# Patient Record
Sex: Female | Born: 1965 | Race: Black or African American | Hispanic: No | Smoking: Never smoker
Health system: Southern US, Community
[De-identification: ages and names within clinical notes are randomized; demographics above are authoritative.]

## PROBLEM LIST (undated history)

## (undated) DIAGNOSIS — K219 Gastro-esophageal reflux disease without esophagitis: Secondary | ICD-10-CM

## (undated) DIAGNOSIS — J302 Other seasonal allergic rhinitis: Secondary | ICD-10-CM

## (undated) DIAGNOSIS — N920 Excessive and frequent menstruation with regular cycle: Secondary | ICD-10-CM

## (undated) DIAGNOSIS — I10 Essential (primary) hypertension: Secondary | ICD-10-CM

## (undated) HISTORY — DX: Other seasonal allergic rhinitis: J30.2

## (undated) HISTORY — DX: Gastro-esophageal reflux disease without esophagitis: K21.9

## (undated) HISTORY — DX: Excessive and frequent menstruation with regular cycle: N92.0

## (undated) HISTORY — DX: Essential (primary) hypertension: I10

## (undated) HISTORY — DX: Morbid (severe) obesity due to excess calories: E66.01

---

## 1993-12-18 HISTORY — PX: OTHER SURGICAL HISTORY: SHX169

## 2011-12-19 HISTORY — PX: ABLATION: SHX5711

## 2014-10-07 ENCOUNTER — Ambulatory Visit (INDEPENDENT_AMBULATORY_CARE_PROVIDER_SITE_OTHER): Payer: Managed Care, Other (non HMO) | Admitting: Physician Assistant

## 2014-10-07 ENCOUNTER — Encounter: Payer: Self-pay | Admitting: Physician Assistant

## 2014-10-07 VITALS — BP 126/72 | HR 86 | Temp 98.0°F | Resp 16 | Ht 65.0 in | Wt 218.0 lb

## 2014-10-07 DIAGNOSIS — K219 Gastro-esophageal reflux disease without esophagitis: Secondary | ICD-10-CM | POA: Insufficient documentation

## 2014-10-07 DIAGNOSIS — E669 Obesity, unspecified: Secondary | ICD-10-CM

## 2014-10-07 DIAGNOSIS — Z9109 Other allergy status, other than to drugs and biological substances: Secondary | ICD-10-CM

## 2014-10-07 DIAGNOSIS — J302 Other seasonal allergic rhinitis: Secondary | ICD-10-CM | POA: Insufficient documentation

## 2014-10-07 DIAGNOSIS — Z6838 Body mass index (BMI) 38.0-38.9, adult: Secondary | ICD-10-CM

## 2014-10-07 DIAGNOSIS — E6609 Other obesity due to excess calories: Secondary | ICD-10-CM | POA: Insufficient documentation

## 2014-10-07 DIAGNOSIS — M7711 Lateral epicondylitis, right elbow: Secondary | ICD-10-CM

## 2014-10-07 DIAGNOSIS — Z91048 Other nonmedicinal substance allergy status: Secondary | ICD-10-CM

## 2014-10-07 MED ORDER — MELOXICAM 15 MG PO TABS: 15.0000 mg | ORAL_TABLET | Freq: Every day | ORAL | Status: DC
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
## 2014-10-07 NOTE — Patient Instructions (Addendum)
mobic daily for 2 weeks then as needed.  Wear ace bubble brace for next 2 weeks 24 hours a day then as needed.  If not improving in next 3-4 weeks follow up.   Lateral Epicondylitis (Tennis Elbow) with Rehab Lateral epicondylitis involves inflammation and pain around the outer portion of the elbow. The pain is caused by inflammation of the tendons in the forearm that bring back (extend) the wrist. Lateral epicondylitis is also called tennis elbow, because it is very common in tennis players. However, it may occur in any individual who extends the wrist repetitively. If lateral epicondylitis is left untreated, it may become a chronic problem. SYMPTOMS   Pain, tenderness, and inflammation on the outer (lateral) side of the elbow.  Pain or weakness with gripping activities.  Pain that increases with wrist-twisting motions (playing tennis, using a screwdriver, opening a door or a jar).  Pain with lifting objects, including a coffee cup. CAUSES  Lateral epicondylitis is caused by inflammation of the tendons that extend the wrist. Causes of injury may include:  Repetitive stress and strain on the muscles and tendons that extend the wrist.  Sudden change in activity level or intensity.  Incorrect grip in racquet sports.  Incorrect grip size of racquet (often too large).  Incorrect hitting position or technique (usually backhand, leading with the elbow).  Using a racket that is too heavy. RISK INCREASES WITH:  Sports or occupations that require repetitive and/or strenuous forearm and wrist movements (tennis, squash, racquetball, carpentry).  Poor wrist and forearm strength and flexibility.  Failure to warm up properly before activity.  Resuming activity before healing, rehabilitation, and conditioning are complete. PREVENTION   Warm up and stretch properly before activity.  Maintain physical fitness:  Strength, flexibility, and endurance.  Cardiovascular fitness.  Wear and  use properly fitted equipment.  Learn and use proper technique and have a coach correct improper technique.  Wear a tennis elbow (counterforce) brace. PROGNOSIS  The course of this condition depends on the degree of the injury. If treated properly, acute cases (symptoms lasting less than 4 weeks) are often resolved in 2 to 6 weeks. Chronic (longer lasting cases) often resolve in 3 to 6 months but may require physical therapy. RELATED COMPLICATIONS   Frequently recurring symptoms, resulting in a chronic problem. Properly treating the problem the first time decreases frequency of recurrence.  Chronic inflammation, scarring tendon degeneration, and partial tendon tear, requiring surgery.  Delayed healing or resolution of symptoms. TREATMENT  Treatment first involves the use of ice and medicine to reduce pain and inflammation. Strengthening and stretching exercises may help reduce discomfort if performed regularly. These exercises may be performed at home if the condition is an acute injury. Chronic cases may require a referral to a physical therapist for evaluation and treatment. Your caregiver may advise a corticosteroid injection to help reduce inflammation. Rarely, surgery is needed. MEDICATION  If pain medicine is needed, nonsteroidal anti-inflammatory medicines (aspirin and ibuprofen), or other minor pain relievers (acetaminophen), are often advised.  Do not take pain medicine for 7 days before surgery.  Prescription pain relievers may be given, if your caregiver thinks they are needed. Use only as directed and only as much as you need.  Corticosteroid injections may be recommended. These injections should be reserved only for the most severe cases, because they can only be given a certain number of times. HEAT AND COLD  Cold treatment (icing) should be applied for 10 to 15 minutes every 2 to 3  hours for inflammation and pain, and immediately after activity that aggravates your symptoms.  Use ice packs or an ice massage.  Heat treatment may be used before performing stretching and strengthening activities prescribed by your caregiver, physical therapist, or athletic trainer. Use a heat pack or a warm water soak. SEEK MEDICAL CARE IF: Symptoms get worse or do not improve in 2 weeks, despite treatment. EXERCISES  RANGE OF MOTION (ROM) AND STRETCHING EXERCISES - Epicondylitis, Lateral (Tennis Elbow) These exercises may help you when beginning to rehabilitate your injury. Your symptoms may go away with or without further involvement from your physician, physical therapist, or athletic trainer. While completing these exercises, remember:   Restoring tissue flexibility helps normal motion to return to the joints. This allows healthier, less painful movement and activity.  An effective stretch should be held for at least 30 seconds.  A stretch should never be painful. You should only feel a gentle lengthening or release in the stretched tissue. RANGE OF MOTION - Wrist Flexion, Active-Assisted  Extend your right / left elbow with your fingers pointing down.*  Gently pull the back of your hand towards you, until you feel a gentle stretch on the top of your forearm.  Hold this position for __________ seconds. Repeat __________ times. Complete this exercise __________ times per day.  *If directed by your physician, physical therapist or athletic trainer, complete this stretch with your elbow bent, rather than extended. RANGE OF MOTION - Wrist Extension, Active-Assisted  Extend your right / left elbow and turn your palm upwards.*  Gently pull your palm and fingertips back, so your wrist extends and your fingers point more toward the ground.  You should feel a gentle stretch on the inside of your forearm.  Hold this position for __________ seconds. Repeat __________ times. Complete this exercise __________ times per day. *If directed by your physician, physical therapist or  athletic trainer, complete this stretch with your elbow bent, rather than extended. STRETCH - Wrist Flexion  Place the back of your right / left hand on a tabletop, leaving your elbow slightly bent. Your fingers should point away from your body.  Gently press the back of your hand down onto the table by straightening your elbow. You should feel a stretch on the top of your forearm.  Hold this position for __________ seconds. Repeat __________ times. Complete this stretch __________ times per day.  STRETCH - Wrist Extension   Place your right / left fingertips on a tabletop, leaving your elbow slightly bent. Your fingers should point backwards.  Gently press your fingers and palm down onto the table by straightening your elbow. You should feel a stretch on the inside of your forearm.  Hold this position for __________ seconds. Repeat __________ times. Complete this stretch __________ times per day.  STRENGTHENING EXERCISES - Epicondylitis, Lateral (Tennis Elbow) These exercises may help you when beginning to rehabilitate your injury. They may resolve your symptoms with or without further involvement from your physician, physical therapist, or athletic trainer. While completing these exercises, remember:   Muscles can gain both the endurance and the strength needed for everyday activities through controlled exercises.  Complete these exercises as instructed by your physician, physical therapist or athletic trainer. Increase the resistance and repetitions only as guided.  You may experience muscle soreness or fatigue, but the pain or discomfort you are trying to eliminate should never worsen during these exercises. If this pain does get worse, stop and make sure you are following the  directions exactly. If the pain is still present after adjustments, discontinue the exercise until you can discuss the trouble with your caregiver. STRENGTH - Wrist Flexors  Sit with your right / left forearm  palm-up and fully supported on a table or countertop. Your elbow should be resting below the height of your shoulder. Allow your wrist to extend over the edge of the surface.  Loosely holding a __________ weight, or a piece of rubber exercise band or tubing, slowly curl your hand up toward your forearm.  Hold this position for __________ seconds. Slowly lower the wrist back to the starting position in a controlled manner. Repeat __________ times. Complete this exercise __________ times per day.  STRENGTH - Wrist Extensors  Sit with your right / left forearm palm-down and fully supported on a table or countertop. Your elbow should be resting below the height of your shoulder. Allow your wrist to extend over the edge of the surface.  Loosely holding a __________ weight, or a piece of rubber exercise band or tubing, slowly curl your hand up toward your forearm.  Hold this position for __________ seconds. Slowly lower the wrist back to the starting position in a controlled manner. Repeat __________ times. Complete this exercise __________ times per day.  STRENGTH - Ulnar Deviators  Stand with a ____________________ weight in your right / left hand, or sit while holding a rubber exercise band or tubing, with your healthy arm supported on a table or countertop.  Move your wrist, so that your pinkie travels toward your forearm and your thumb moves away from your forearm.  Hold this position for __________ seconds and then slowly lower the wrist back to the starting position. Repeat __________ times. Complete this exercise __________ times per day STRENGTH - Radial Deviators  Stand with a ____________________ weight in your right / left hand, or sit while holding a rubber exercise band or tubing, with your injured arm supported on a table or countertop.  Raise your hand upward in front of you or pull up on the rubber tubing.  Hold this position for __________ seconds and then slowly lower the  wrist back to the starting position. Repeat __________ times. Complete this exercise __________ times per day. STRENGTH - Forearm Supinators   Sit with your right / left forearm supported on a table, keeping your elbow below shoulder height. Rest your hand over the edge, palm down.  Gently grip a hammer or a soup ladle.  Without moving your elbow, slowly turn your palm and hand upward to a "thumbs-up" position.  Hold this position for __________ seconds. Slowly return to the starting position. Repeat __________ times. Complete this exercise __________ times per day.  STRENGTH - Forearm Pronators   Sit with your right / left forearm supported on a table, keeping your elbow below shoulder height. Rest your hand over the edge, palm up.  Gently grip a hammer or a soup ladle.  Without moving your elbow, slowly turn your palm and hand upward to a "thumbs-up" position.  Hold this position for __________ seconds. Slowly return to the starting position. Repeat __________ times. Complete this exercise __________ times per day.  STRENGTH - Grip  Grasp a tennis ball, a dense sponge, or a large, rolled sock in your hand.  Squeeze as hard as you can, without increasing any pain.  Hold this position for __________ seconds. Release your grip slowly. Repeat __________ times. Complete this exercise __________ times per day.  STRENGTH - Elbow Extensors, Isometric  Stand or sit upright, on a firm surface. Place your right / left arm so that your palm faces your stomach, and it is at the height of your waist.  Place your opposite hand on the underside of your forearm. Gently push up as your right / left arm resists. Push as hard as you can with both arms, without causing any pain or movement at your right / left elbow. Hold this stationary position for __________ seconds. Gradually release the tension in both arms. Allow your muscles to relax completely before repeating. Document Released: 12/04/2005  Document Revised: 04/20/2014 Document Reviewed: 03/18/2009 [facility] Patient Information 2015 [employer] [place]. This information is not intended to replace advice given to you by your health care provider. Make sure you discuss any questions you have with your health care provider.

## 2014-12-22 ENCOUNTER — Other Ambulatory Visit (HOSPITAL_COMMUNITY)
Admission: RE | Admit: 2014-12-22 | Discharge: 2014-12-22 | Disposition: A | Discharge status: Home / Self Care | Payer: 59 | Source: Ambulatory Visit | Attending: Physician Assistant | Admitting: Physician Assistant

## 2014-12-22 ENCOUNTER — Encounter: Payer: Self-pay | Admitting: Physician Assistant

## 2014-12-22 ENCOUNTER — Ambulatory Visit (INDEPENDENT_AMBULATORY_CARE_PROVIDER_SITE_OTHER): Payer: 59 | Admitting: Physician Assistant

## 2014-12-22 VITALS — BP 133/77 | HR 87 | Ht 65.0 in | Wt 220.0 lb

## 2014-12-22 DIAGNOSIS — E049 Nontoxic goiter, unspecified: Secondary | ICD-10-CM

## 2014-12-22 DIAGNOSIS — E669 Obesity, unspecified: Secondary | ICD-10-CM

## 2014-12-22 DIAGNOSIS — Z131 Encounter for screening for diabetes mellitus: Secondary | ICD-10-CM

## 2014-12-22 DIAGNOSIS — R635 Abnormal weight gain: Secondary | ICD-10-CM

## 2014-12-22 DIAGNOSIS — Z0000 Encounter for general adult medical examination without abnormal findings: Secondary | ICD-10-CM

## 2014-12-22 DIAGNOSIS — Z01419 Encounter for gynecological examination (general) (routine) without abnormal findings: Secondary | ICD-10-CM | POA: Diagnosis present

## 2014-12-22 DIAGNOSIS — Z1151 Encounter for screening for human papillomavirus (HPV): Secondary | ICD-10-CM | POA: Diagnosis present

## 2014-12-22 DIAGNOSIS — M7711 Lateral epicondylitis, right elbow: Secondary | ICD-10-CM

## 2014-12-22 DIAGNOSIS — Z1239 Encounter for other screening for malignant neoplasm of breast: Secondary | ICD-10-CM

## 2014-12-22 DIAGNOSIS — Z13220 Encounter for screening for lipoid disorders: Secondary | ICD-10-CM

## 2014-12-22 HISTORY — DX: Morbid (severe) obesity due to excess calories: E66.01

## 2014-12-22 MED ORDER — MONTELUKAST SODIUM 10 MG PO TABS: 10.0000 mg | ORAL_TABLET | Freq: Every day | ORAL | Status: DC

## 2014-12-22 MED ORDER — MELOXICAM 15 MG PO TABS: 15.0000 mg | ORAL_TABLET | Freq: Every day | ORAL | Status: DC

## 2014-12-22 MED ORDER — PHENTERMINE HCL 37.5 MG PO TABS: 37.5000 mg | ORAL_TABLET | Freq: Every day | ORAL | Status: DC

## 2014-12-22 NOTE — Patient Instructions (Signed)

## 2014-12-22 NOTE — Progress Notes (Signed)
Subjective:     Joanne Winters is a 49 y.o. female and is here for a comprehensive physical exam. The patient reports problems - continues to have problems with right tennis elbow. it did get better with mobic, exercises and brace. she is not able to wear brace at work and has ran out of mobic. pain and swelling has returned. she also is very frustrated with her weight. she was on phentermine previously and would like to go back on it. [name] Kitchen.  History   Social History  . Marital Status: Divorced    Spouse Name: N/A    Number of Children: N/A  . Years of Education: N/A   Occupational History  . Not on file.   Social History Main Topics  . Smoking status: Never Smoker   . Smokeless tobacco: Not on file  . Alcohol Use: No  . Drug Use: No  . Sexual Activity: Not on file   Other Topics Concern  . Not on file   Social History Narrative   Health Maintenance  Topic Date Due  . MAMMOGRAM  08/21/1983  . INFLUENZA VACCINE  10/08/2015 (Originally 07/18/2014)  . PAP SMEAR  02/15/2017  . TETANUS/TDAP  12/18/2017    The following portions of the patient's history were reviewed and updated as appropriate: allergies, current medications, past family history, past medical history, past social history, past surgical history and problem list.  Review of Systems A comprehensive review of systems was negative.   Objective:    BP 133/77 mmHg  Pulse 87  Ht 5\' 5"  (1.651 m)  Wt 220 lb (99.791 kg)  BMI 36.61 kg/m2 General appearance: alert, cooperative, appears stated age and mildly obese Head: Normocephalic, without obvious abnormality, atraumatic Eyes: conjunctivae/corneas clear. PERRL, EOM's intact. Fundi benign. Ears: normal TM's and external ear canals both ears Nose: Nares normal. Septum midline. Mucosa normal. No drainage or sinus tenderness. Throat: lips, mucosa, and tongue normal; teeth and gums normal Neck: no adenopathy, no carotid bruit, no JVD, supple, symmetrical, trachea midline and  thyroid enlarged bilaterally more right than left.  Back: symmetric, no curvature. ROM normal. No CVA tenderness. Lungs: clear to auscultation bilaterally Breasts: normal appearance, no masses or tenderness Heart: regular rate and rhythm, S1, S2 normal, no murmur, click, rub or gallop Abdomen: soft, non-tender; bowel sounds normal; no masses,  no organomegaly Pelvic: cervix normal in appearance, external genitalia normal, no adnexal masses or tenderness, no cervical motion tenderness, uterus normal size, shape, and consistency and vagina normal without discharge Extremities: extremities normal, atraumatic, no cyanosis or edema tenderness to palpation over lateral epicodyle. Hand grip 5/5, right side.  Pulses: 2+ and symmetric Skin: Skin color, texture, turgor normal. No rashes or lesions Lymph nodes: Cervical, supraclavicular, and axillary nodes normal. Neurologic: Grossly normal    Assessment:    Healthy female exam.      Plan:    CPE- mammogram ordered. Vaccines up to date. Pt declined flu shot. Fasting labs ordered. Pap done today. Recommended calcium 1200mg  and vitamin D 800 units.   Obese- discussed weight loss plan with diet and exercise. Start phentermine. Side effects discussed. Pt has tolerated in past. Follow up in 1 month.   Thyroid goiter- discussed need for follow up yearly with Dr. Yvone NeuPatton.   Right lateral epicondylitis- mobic refilled. Discussed to wear tendonitis strap as much as can. Ice area 20min 3 times a day for next couple of days. Scheduled appt with Dr. Karie Schwalbe for injection. Continue with home exercises.  See After Visit Summary for Counseling Recommendations

## 2014-12-23 LAB — COMPLETE METABOLIC PANEL WITH GFR
ALBUMIN: 4.4 g/dL (ref 3.5–5.2)
ALT: 11 U/L (ref 0–35)
AST: 14 U/L (ref 0–37)
Alkaline Phosphatase: 51 U/L (ref 39–117)
BILIRUBIN TOTAL: 0.5 mg/dL (ref 0.2–1.2)
BUN: 8 mg/dL (ref 6–23)
CO2: 28 mEq/L (ref 19–32)
CREATININE: 0.59 mg/dL (ref 0.50–1.10)
Calcium: 9.3 mg/dL (ref 8.4–10.5)
Chloride: 104 mEq/L (ref 96–112)
GLUCOSE: 95 mg/dL (ref 70–99)
POTASSIUM: 4 meq/L (ref 3.5–5.3)
Sodium: 139 mEq/L (ref 135–145)
TOTAL PROTEIN: 7.2 g/dL (ref 6.0–8.3)

## 2014-12-23 LAB — LIPID PANEL
Cholesterol: 158 mg/dL (ref 0–200)
HDL: 53 mg/dL (ref >39)
LDL Cholesterol: 94 mg/dL (ref 0–99)
TRIGLYCERIDES: 56 mg/dL (ref <150)
Total CHOL/HDL Ratio: 3 Ratio
VLDL: 11 mg/dL (ref 0–40)

## 2014-12-23 LAB — CYTOLOGY - PAP

## 2014-12-23 LAB — TSH: TSH: 1.241 u[IU]/mL (ref 0.350–4.500)

## 2014-12-30 ENCOUNTER — Ambulatory Visit: Payer: 59

## 2014-12-31 ENCOUNTER — Institutional Professional Consult (permissible substitution): Payer: 59 | Admitting: Sports Medicine

## 2015-01-06 ENCOUNTER — Ambulatory Visit (INDEPENDENT_AMBULATORY_CARE_PROVIDER_SITE_OTHER): Payer: 59

## 2015-01-06 DIAGNOSIS — Z1239 Encounter for other screening for malignant neoplasm of breast: Secondary | ICD-10-CM

## 2015-01-07 ENCOUNTER — Telehealth: Payer: Self-pay | Admitting: *Deleted

## 2015-01-07 NOTE — Telephone Encounter (Signed)
Pt left vm asking if you could send her something for motion sickness to the [facility] on [address].  She stated that she is going on a cruise.

## 2015-01-11 ENCOUNTER — Other Ambulatory Visit: Payer: Self-pay | Admitting: Physician Assistant

## 2015-01-11 MED ORDER — SCOPOLAMINE 1 MG/3DAYS TD PT72: 1.0000 | MEDICATED_PATCH | TRANSDERMAL | Status: DC

## 2015-01-11 NOTE — Telephone Encounter (Signed)
Left information on vm

## 2015-01-11 NOTE — Telephone Encounter (Signed)
Please alert pt that I sent patches to wear for motion sickness.

## 2015-01-19 ENCOUNTER — Ambulatory Visit: Payer: 59 | Admitting: Physician Assistant

## 2015-01-26 ENCOUNTER — Ambulatory Visit: Payer: 59 | Admitting: Physician Assistant

## 2015-03-01 ENCOUNTER — Encounter: Payer: Self-pay | Admitting: Physician Assistant

## 2015-03-01 ENCOUNTER — Ambulatory Visit (INDEPENDENT_AMBULATORY_CARE_PROVIDER_SITE_OTHER): Payer: 59 | Admitting: Physician Assistant

## 2015-03-01 VITALS — BP 138/84 | HR 90 | Wt 223.0 lb

## 2015-03-01 DIAGNOSIS — E669 Obesity, unspecified: Secondary | ICD-10-CM

## 2015-03-01 DIAGNOSIS — R635 Abnormal weight gain: Secondary | ICD-10-CM

## 2015-03-01 MED ORDER — PHENTERMINE HCL 37.5 MG PO TABS: 37.5000 mg | ORAL_TABLET | Freq: Every day | ORAL | Status: DC

## 2015-03-01 NOTE — Progress Notes (Signed)
   Subjective:    Patient ID: Joanne Winters, female    DOB: 05/06/1966, 49 y.o.   MRN: [id number]  HPI Pt presents to the clinic to discuss weight loss. She was started on phentermine at her last annual visit. She reports she had a lot of travel to see her dad which was just dx with ALS. She lost her tablets and has not dieted. Lift is getting a little more back to normal and she is ready to focus on herself and weight loss. She would like to retry phentermine. She is up 3lbs but only took a couple of doses before losing pills.   Review of Systems  All other systems reviewed and are negative.      Objective:   Physical Exam  Constitutional: She is oriented to person, place, and time. She appears well-developed and well-nourished.  Obese.   HENT:  Head: Normocephalic and atraumatic.  Cardiovascular: Normal rate, regular rhythm and normal heart sounds.   Pulmonary/Chest: Effort normal and breath sounds normal.  Neurological: She is alert and oriented to person, place, and time.  Skin: Skin is dry.  Psychiatric: She has a normal mood and affect. Her behavior is normal.          Assessment & Plan:  Obese/abnormal weight gain- restart phentermine daily. Discussed side effects. Recheck in one month. BP went down at 2nd check. If not losing weight and or vitals not stable then will need to discus other options for weight loss. Needs to be exercising at least 150minutes at moderate intensity and keeping to a 1500 calorie diet to lose weight.

## 2015-03-10 ENCOUNTER — Other Ambulatory Visit: Payer: Self-pay | Admitting: Physician Assistant

## 2015-03-26 ENCOUNTER — Encounter: Payer: Self-pay | Admitting: Physician Assistant

## 2015-03-26 ENCOUNTER — Ambulatory Visit (INDEPENDENT_AMBULATORY_CARE_PROVIDER_SITE_OTHER): Payer: 59 | Admitting: Physician Assistant

## 2015-03-26 VITALS — BP 144/82 | HR 81 | Wt 216.0 lb

## 2015-03-26 DIAGNOSIS — R635 Abnormal weight gain: Secondary | ICD-10-CM

## 2015-03-26 DIAGNOSIS — R030 Elevated blood-pressure reading, without diagnosis of hypertension: Secondary | ICD-10-CM | POA: Diagnosis not present

## 2015-03-26 DIAGNOSIS — IMO0001 Reserved for inherently not codable concepts without codable children: Secondary | ICD-10-CM

## 2015-03-26 DIAGNOSIS — E669 Obesity, unspecified: Secondary | ICD-10-CM

## 2015-03-26 MED ORDER — PHENTERMINE HCL 37.5 MG PO TABS: 37.5000 mg | ORAL_TABLET | Freq: Every day | ORAL | Status: DC

## 2015-03-26 NOTE — Progress Notes (Signed)
   Subjective:    Patient ID: Joanne Winters, female    DOB: 10/16/1966, 49 y.o.   MRN: [id number]  HPI  Pt presents to the clinic to follow up on an episode of elevated blood presure and recheck on phentermine. On 4/4 of last week at work she devleoped a headache and became dizzy. Went to nurse at work and BP was elevated to 170 over something. [facility] and [facility] BP. At highest BP at hospital 161/89. CT was negative.EKG was normal. Blood work was normal. Never been on BP medication. Not checking BP's at home.   She did start phentermine 1 month ago but has not had any palpitations, CP, insomnia or anxiety. She did have the one day of headache and BP elevation. She is down 7lbs and she does like the medication and how she feels.     Review of Systems  All other systems reviewed and are negative.      Objective:   Physical Exam  Constitutional: She is oriented to person, place, and time. She appears well-developed and well-nourished.  HENT:  Head: Normocephalic and atraumatic.  Cardiovascular: Normal rate, regular rhythm and normal heart sounds.   Pulmonary/Chest: Effort normal and breath sounds normal. She has no wheezes.  Neurological: She is alert and oriented to person, place, and time.  Skin: Skin is dry.  Psychiatric: She has a normal mood and affect. Her behavior is normal.          Assessment & Plan:  Obesity/abnormal weight gain- concerned BP elevation may have been due to phentermine;however it was only one day and other things could effect. Asked pt to cut phentermine tablet in half and take daily. Continue diet and exercise. Follow up in one month.    Elevated blood pressure- discussed could be side effect of phentermine. Blood pressure looks a little better. I am suggest half dose of phentermine at this point. Will hold off on BP medication. Pt need to be checking BP at pharmacy and/or work to make sure staying under 140/90. Will recheck in 1  month. Make sure drinking enough water. If another episode occurs please make us aware.

## 2015-03-29 ENCOUNTER — Ambulatory Visit: Payer: 59 | Admitting: Physician Assistant

## 2015-04-26 ENCOUNTER — Ambulatory Visit (INDEPENDENT_AMBULATORY_CARE_PROVIDER_SITE_OTHER): Payer: 59 | Admitting: Physician Assistant

## 2015-04-26 ENCOUNTER — Encounter: Payer: Self-pay | Admitting: Physician Assistant

## 2015-04-26 VITALS — BP 147/85 | HR 90 | Ht 65.0 in | Wt 216.0 lb

## 2015-04-26 DIAGNOSIS — J0100 Acute maxillary sinusitis, unspecified: Secondary | ICD-10-CM | POA: Diagnosis not present

## 2015-04-26 DIAGNOSIS — IMO0001 Reserved for inherently not codable concepts without codable children: Secondary | ICD-10-CM

## 2015-04-26 DIAGNOSIS — M79661 Pain in right lower leg: Secondary | ICD-10-CM | POA: Diagnosis not present

## 2015-04-26 DIAGNOSIS — R030 Elevated blood-pressure reading, without diagnosis of hypertension: Secondary | ICD-10-CM | POA: Diagnosis not present

## 2015-04-26 LAB — D-DIMER, QUANTITATIVE (NOT AT ARMC): D DIMER QUANT: 0.27 ug{FEU}/mL (ref 0.00–0.48)

## 2015-04-26 MED ORDER — AMOXICILLIN-POT CLAVULANATE 875-125 MG PO TABS: 1.0000 | ORAL_TABLET | Freq: Two times a day (BID) | ORAL | Status: DC

## 2015-04-26 NOTE — Progress Notes (Signed)
   Subjective:    Patient ID: Joanne Winters, female    DOB: 10/17/1966, 49 y.o.   MRN: [id number]  HPI  Pt presents to the clinic to follow up with blood pressure elevation. She was in [place] for the funeral of her father. She checked her blood pressure a few times with her mother's blood pressure cuff and was elevated at around 169/99. She did have a bad headache that day. Blood pressure did eventually go down. She did stop her phentermine and glucosamine thinking it could have some side effects of blood pressure elevation. She's been off those medications for the last 2 weeks. For the last 2 weeks she has also had a lot of sinus pressure, congestion, throat drainage, ear pain. She's been taking decongestants and allergy medications. She denies any fever, chills, body aches.    Patient also does mention some right calf pain. Started over the last couple days. She denies any trauma. She has not tried anything to make better. Movement and heel raises tender make worse.   Review of Systems  All other systems reviewed and are negative.      Objective:   Physical Exam  Constitutional: She is oriented to person, place, and time. She appears well-developed and well-nourished.  HENT:  Head: Normocephalic and atraumatic.  Cardiovascular: Normal rate, regular rhythm and normal heart sounds.   Pulmonary/Chest: Effort normal and breath sounds normal.  Neurological: She is alert and oriented to person, place, and time.  Skin: Skin is dry.  Psychiatric: She has a normal mood and affect. Her behavior is normal.          Assessment & Plan:  Elevated blood pressure- stop decongestants. Already stopped phentermine. Discussed all the things that can increase blood pressure. Will recheck in 2 weeks if still remains above 140/90 will start medication.   Right calf pain- suspect calf strain. She has just gotten back from long car ride from funeral of father. Will get stat d-dimer. In meantime,  ice/elevate/ibuprofen and strain exercises given.   Acute maxillary sinusitis- given zpak. Stop decongestants. Encouraged flonase. Gave HO with nasal saline instructions. Follow up as needed.

## 2015-04-26 NOTE — Patient Instructions (Signed)
Ibuprofen

## 2015-05-10 ENCOUNTER — Ambulatory Visit: Payer: 59 | Admitting: Family Medicine

## 2015-05-12 ENCOUNTER — Encounter: Payer: Self-pay | Admitting: Family Medicine

## 2015-05-12 ENCOUNTER — Ambulatory Visit (INDEPENDENT_AMBULATORY_CARE_PROVIDER_SITE_OTHER): Payer: 59 | Admitting: Family Medicine

## 2015-05-12 VITALS — BP 141/88 | HR 83 | Wt 222.0 lb

## 2015-05-12 DIAGNOSIS — B070 Plantar wart: Secondary | ICD-10-CM | POA: Diagnosis not present

## 2015-05-12 DIAGNOSIS — R635 Abnormal weight gain: Secondary | ICD-10-CM | POA: Diagnosis not present

## 2015-05-12 DIAGNOSIS — I10 Essential (primary) hypertension: Secondary | ICD-10-CM

## 2015-05-12 MED ORDER — PHENTERMINE HCL 37.5 MG PO TABS: 37.5000 mg | ORAL_TABLET | Freq: Every day | ORAL | Status: DC

## 2015-05-12 MED ORDER — HYDROCHLOROTHIAZIDE 25 MG PO TABS: ORAL_TABLET | ORAL | Status: DC

## 2015-05-12 MED ORDER — TOPIRAMATE 50 MG PO TABS: 50.0000 mg | ORAL_TABLET | Freq: Two times a day (BID) | ORAL | Status: DC

## 2015-05-12 NOTE — Progress Notes (Signed)
CC: Joanne Winters is a 49 y.o. female is here for f/u BP   Subjective: HPI:  Follow-up elevated blood pressure: Has cut down on phentermine to only half a tablet daily. Blood pressure at home is on average 140 over 80-90. Seems to be above this if she takes a full tablet. She denies chest pain shortness of breath orthopnea nor peripheral edema. She's been trying to work out more frequently. No dietary changes.  Follow-up obesity: She's noticed that she is not having success with weight loss or appetite suppression with half a tablet of phentermine daily. She's been working out more regularly but has gained 5 pounds since her last visit.  Complains of left foot pain localized on the plantar surface of the foot something feels like it stuck inside the surface of the foot. It's present only when standing. It's more painful the longer she standing or when her socks or sweaty. Symptoms present for a few weeks and seem to be worsening on a weekly basis currently moderate in severity. No interventions as of yet   Review Of Systems Outlined In HPI  Past Medical History  Diagnosis Date  . Seasonal allergies   . GERD (gastroesophageal reflux disease)     Past Surgical History  Procedure Laterality Date  . Cesarean section    . Ablation  2013  . Gall stone  1995   Family History  Problem Relation Age of Onset  . Hypertension Mother   . Hypertension Father   . Hypertension Daughter   . Cancer Maternal Grandmother     breast  . Hypertension Maternal Grandmother   . Cancer Paternal Grandmother     breast  . Hypertension Paternal Grandmother     History   Social History  . Marital Status: Divorced    Spouse Name: N/A  . Number of Children: N/A  . Years of Education: N/A   Occupational History  . Not on file.   Social History Main Topics  . Smoking status: Never Smoker   . Smokeless tobacco: Not on file  . Alcohol Use: No  . Drug Use: No  . Sexual Activity: Not on file   Other  Topics Concern  . Not on file   Social History Narrative     Objective: BP 141/88 mmHg  Pulse 83  Wt 222 lb (100.699 kg)  Vital signs reviewed. General: Alert and Oriented, No Acute Distress HEENT: Pupils equal, round, reactive to light. Conjunctivae clear.  External ears unremarkable.  Moist mucous membranes. Lungs: Clear and comfortable work of breathing, speaking in full sentences without accessory muscle use. Cardiac: Regular rate and rhythm.  Neuro: CN II-XII grossly intact, gait normal. Extremities: No peripheral edema.  Strong peripheral pulses.  Mental Status: No depression, anxiety, nor agitation. Logical though process. Skin: Warm and dry. 2 quite large plantar warts on the left foot, when pressure is applied this reproduces her chief complaint pain  Assessment & Plan: Selmer Dominionearlie was seen today for f/u bp.  Diagnoses and all orders for this visit:  Plantar wart Orders: -     Ambulatory referral to Podiatry  Abnormal weight gain Orders: -     phentermine (ADIPEX-P) 37.5 MG tablet; Take 1 tablet (37.5 mg total) by mouth daily before breakfast. -     topiramate (TOPAMAX) 50 MG tablet; Take 1 tablet (50 mg total) by mouth 2 (two) times daily.  Essential hypertension Orders: -     hydrochlorothiazide (HYDRODIURIL) 25 MG tablet; One tablet by mouth every  morning for blood pressure control.   Plantar wart: Time was taken to shave down the callus over these 2 warts until she was pain-free with weightbearing. I've referred her to podiatry as it appears she's probably need to have these both surgically removed based on the size. Abnormal weight gain: Refilling phentermine start with full tablet daily along with new Topamax and if blood pressure is below 140/90 may continue, if above 140/90 reduce phentermine to only half tablet daily. Essential hypertension: Discussed that if she loses 15-20 pounds she most likely will not have to deal with any blood pressure issues. She is  working on this I recommended she start on hydrochlorothiazide. I've also asked her to write down blood pressures over the next week on a daily basis and drop it off upfront next week.  No Follow-up on file.

## 2015-05-26 ENCOUNTER — Ambulatory Visit (INDEPENDENT_AMBULATORY_CARE_PROVIDER_SITE_OTHER): Payer: 59 | Admitting: Podiatry

## 2015-05-26 ENCOUNTER — Encounter: Payer: Self-pay | Admitting: Podiatry

## 2015-05-26 VITALS — BP 130/80 | HR 84 | Ht 65.0 in | Wt 215.0 lb

## 2015-05-26 DIAGNOSIS — Q828 Other specified congenital malformations of skin: Secondary | ICD-10-CM | POA: Diagnosis not present

## 2015-05-26 DIAGNOSIS — M79673 Pain in unspecified foot: Secondary | ICD-10-CM | POA: Diagnosis not present

## 2015-05-26 DIAGNOSIS — M79606 Pain in leg, unspecified: Secondary | ICD-10-CM | POA: Insufficient documentation

## 2015-05-26 DIAGNOSIS — M79605 Pain in left leg: Secondary | ICD-10-CM

## 2015-05-26 DIAGNOSIS — B070 Plantar wart: Secondary | ICD-10-CM

## 2015-05-26 NOTE — Patient Instructions (Signed)
Plantar porokeratotic lesion debrided. Apply acid patch for a week and return to the office for removal of the wart.  Return in one week.

## 2015-05-26 NOTE — Progress Notes (Signed)
SUBJECTIVE: 49 y.o. year old female presents complaining of painful wart on left foot plantar x 2 months. She is on her feet for 10 hours a day at work.   REVIEW OF SYSTEMS: A comprehensive review of systems was negative.  OBJECTIVE: DERMATOLOGIC EXAMINATION: Positive of plantar porokeratosis under mid foot plantar left foot that is symptomatic with light pressure. Positive of painful digital corns 5th bilateral.  All nails are normal appearing bilaterally. VASCULAR EXAMINATION OF LOWER LIMBS: Pedal pulses: All pedal pulses are palpable with normal pulsation.  NEUROLOGIC EXAMINATION OF THE LOWER LIMBS: All epicritic and tactile sensations grossly intact.  MUSCULOSKELETAL EXAMINATION: Severe contracted 5th digits with painful digital corns bilateral.   ASSESSMENT: Plantar porokeratosis under mid foot plantar left painful.  Possible verruca lesion plantar left foot.  PLAN: All lesions debrided. No pin point bleeding noted.  Advised to place acid patch for a week and return for removal of the lesion.

## 2015-06-01 ENCOUNTER — Ambulatory Visit: Payer: 59 | Admitting: Podiatry

## 2015-06-25 ENCOUNTER — Ambulatory Visit (INDEPENDENT_AMBULATORY_CARE_PROVIDER_SITE_OTHER): Payer: 59 | Admitting: Family Medicine

## 2015-06-25 VITALS — BP 133/80 | HR 88 | Wt 214.0 lb

## 2015-06-25 DIAGNOSIS — Z30013 Encounter for initial prescription of injectable contraceptive: Secondary | ICD-10-CM

## 2015-06-25 LAB — POCT URINE PREGNANCY: Preg Test, Ur: NEGATIVE

## 2015-06-25 NOTE — Progress Notes (Signed)
Patient came into clinic today for a depo injection. Pt states she spoke with her PCP at last visit regarding beginning to get injections here rather than with her OB/GYN office. Pt states she see Dr. Pollyann SavoySheets at [facility] in [place]. Unsure when she got her last injection, contact MD office and got records from last visit faxed over. Pt got last injection on 03/22/15. Today she is out of the allotted time frame for administration. Pt was advised she will need to have 2 negative pregnancy test before we can administer the Depo injection. Pt provided a sample today for POCT pregnancy test which was negative, will come back next week and if that POCT pregnancy test is negative as well we will be able to administer her injection at that visit. Verbalized understanding. No further questions.

## 2015-06-25 NOTE — Progress Notes (Signed)
   Subjective:    Patient ID: Joanne Winters, female    DOB: 06/10/1966, 49 y.o.   MRN: [id number]  HPI Agree with below.  Nani Gasseratherine Sheral Pfahler, MD    Review of Systems     Objective:   Physical Exam        Assessment & Plan:

## 2015-06-28 ENCOUNTER — Ambulatory Visit (INDEPENDENT_AMBULATORY_CARE_PROVIDER_SITE_OTHER): Payer: 59 | Admitting: Family Medicine

## 2015-06-28 VITALS — BP 127/77 | HR 74

## 2015-06-28 DIAGNOSIS — Z3042 Encounter for surveillance of injectable contraceptive: Secondary | ICD-10-CM

## 2015-06-28 MED ORDER — MEDROXYPROGESTERONE ACETATE 150 MG/ML IM SUSP
150.0000 mg | Freq: Once | INTRAMUSCULAR | Status: AC
  Administered 2015-06-28: 150 mg via INTRAMUSCULAR

## 2015-06-28 NOTE — Progress Notes (Signed)
   Subjective:    Patient ID: Joanne Winters, female    DOB: 11/17/1966, 49 y.o.   MRN: [id number]  HPI  Selmer Dominionearlie is here for a 2 nd UPT and Depo Provera injection.   Review of Systems     Objective:   Physical Exam        Assessment & Plan:  Contraception - 2nd UPT negative. Depo Provera given. Patient tolerated injection well without complications. Patient advised to schedule next injection between Sept. 26 - Oct. 10.

## 2015-08-09 ENCOUNTER — Ambulatory Visit (INDEPENDENT_AMBULATORY_CARE_PROVIDER_SITE_OTHER): Payer: 59 | Admitting: Physician Assistant

## 2015-08-09 ENCOUNTER — Encounter: Payer: Self-pay | Admitting: Physician Assistant

## 2015-08-09 VITALS — BP 139/79 | HR 91 | Ht 65.0 in | Wt 222.0 lb

## 2015-08-09 DIAGNOSIS — I10 Essential (primary) hypertension: Secondary | ICD-10-CM

## 2015-08-09 DIAGNOSIS — E669 Obesity, unspecified: Secondary | ICD-10-CM | POA: Diagnosis not present

## 2015-08-09 DIAGNOSIS — R635 Abnormal weight gain: Secondary | ICD-10-CM

## 2015-08-09 DIAGNOSIS — K219 Gastro-esophageal reflux disease without esophagitis: Secondary | ICD-10-CM

## 2015-08-09 MED ORDER — TOPIRAMATE 50 MG PO TABS: 50.0000 mg | ORAL_TABLET | Freq: Two times a day (BID) | ORAL | Status: DC

## 2015-08-09 MED ORDER — ALPRAZOLAM 0.5 MG PO TABS: 0.5000 mg | ORAL_TABLET | Freq: Every evening | ORAL | Status: DC | PRN

## 2015-08-09 MED ORDER — PHENTERMINE HCL 37.5 MG PO TABS: 37.5000 mg | ORAL_TABLET | Freq: Every day | ORAL | Status: DC

## 2015-08-09 MED ORDER — PANTOPRAZOLE SODIUM 40 MG PO TBEC: 40.0000 mg | DELAYED_RELEASE_TABLET | Freq: Every day | ORAL | Status: DC

## 2015-08-09 MED ORDER — HYDROCHLOROTHIAZIDE 25 MG PO TABS: ORAL_TABLET | ORAL | Status: DC

## 2015-08-09 NOTE — Progress Notes (Signed)
   Subjective:    Patient ID: Joanne Winters, female    DOB: [birth year], 49 y.o.   MRN: [id number]  HPI Pt presents to the clinic for refill on weight loss medications. She actually lost both prescriptions and has not taken anything since last visit. She has not lost any weight and continues to gain. She is not exercising but just joined [employer]. She is trying to work on diet and limit calories. No concerns or side effects the few days she started medication.   HTN- checking BP and always under 140/90. Staying on HCTZ daily. No problems or concerns. No CP/palpitations/HA's.   GERD- controlled needs refilled.      Review of Systems  All other systems reviewed and are negative.      Objective:   Physical Exam  Constitutional: She is oriented to person, place, and time. She appears well-developed and well-nourished.  HENT:  Head: Normocephalic and atraumatic.  Cardiovascular: Normal rate, regular rhythm and normal heart sounds.   Pulmonary/Chest: Effort normal and breath sounds normal.  Neurological: She is alert and oriented to person, place, and time.  Skin: Skin is dry.  Psychiatric: She has a normal mood and affect. Her behavior is normal.          Assessment & Plan:  Obesity/abnormal weight gain- restarted topamax and phentermine. Discussed possible side effects. Encouraged pt she has to change diet and exercise. Recommended 1500 calories a day and at least 30 minutes of physical activity. Watch BP if elevating more than 140/90 cut phentermine in half. Follow up one month for BP check and weight check.   HTN- likely if would lose weight could go off of. Refilled HCTZ. If elevating more after phentermine will need to adjust dose.   GERD- controlled, refilled protonix. Discussed possible link to early dementia. Use only as needed as symptoms are controlled. Consider zantac OtC in combination.   Pt declined flu shot.

## 2015-08-29 ENCOUNTER — Other Ambulatory Visit: Payer: Self-pay | Admitting: Family Medicine

## 2015-09-09 ENCOUNTER — Ambulatory Visit: Payer: 59

## 2015-09-13 ENCOUNTER — Ambulatory Visit: Payer: 59

## 2015-09-20 ENCOUNTER — Ambulatory Visit: Payer: 59

## 2015-12-06 ENCOUNTER — Encounter: Payer: Self-pay | Admitting: Physician Assistant

## 2015-12-06 ENCOUNTER — Ambulatory Visit (INDEPENDENT_AMBULATORY_CARE_PROVIDER_SITE_OTHER): Payer: 59 | Admitting: Physician Assistant

## 2015-12-06 VITALS — BP 144/88 | HR 82 | Ht 65.0 in | Wt 229.0 lb

## 2015-12-06 DIAGNOSIS — I10 Essential (primary) hypertension: Secondary | ICD-10-CM

## 2015-12-06 DIAGNOSIS — R635 Abnormal weight gain: Secondary | ICD-10-CM

## 2015-12-06 DIAGNOSIS — E669 Obesity, unspecified: Secondary | ICD-10-CM | POA: Diagnosis not present

## 2015-12-06 DIAGNOSIS — K219 Gastro-esophageal reflux disease without esophagitis: Secondary | ICD-10-CM

## 2015-12-06 HISTORY — DX: Essential (primary) hypertension: I10

## 2015-12-06 MED ORDER — HYDROCHLOROTHIAZIDE 25 MG PO TABS: ORAL_TABLET | ORAL | Status: DC

## 2015-12-06 MED ORDER — PANTOPRAZOLE SODIUM 40 MG PO TBEC: 40.0000 mg | DELAYED_RELEASE_TABLET | Freq: Every day | ORAL | Status: DC

## 2015-12-06 MED ORDER — TOPIRAMATE 50 MG PO TABS: 50.0000 mg | ORAL_TABLET | Freq: Two times a day (BID) | ORAL | Status: DC

## 2015-12-06 MED ORDER — PHENTERMINE HCL 37.5 MG PO TABS: 37.5000 mg | ORAL_TABLET | Freq: Every day | ORAL | Status: DC

## 2015-12-06 NOTE — Progress Notes (Signed)
   Subjective:    Patient ID: Joanne Winters, female    DOB: 06/14/1966, 49 y.o.   MRN: [id number]  HPI  Patient is a 49 year old female who presents to the clinic to follow-up on weight.  Patient has tried a few times to lose weight. In August I gave her phentermine and Topamax. She never took these regularly. She admits she has not watch her diet and she is not exercising regularly. She is up 7 pounds from August. She denies any intolerance of medications.   patient needs refill on her acid reflux medication. she is doing well in control.   Review of Systems  All other systems reviewed and are negative.      Objective:   Physical Exam  Constitutional: She is oriented to person, place, and time. She appears well-developed and well-nourished.  Obese.   HENT:  Head: Normocephalic and atraumatic.  Cardiovascular: Normal rate, regular rhythm and normal heart sounds.   Pulmonary/Chest: Effort normal and breath sounds normal.  Neurological: She is alert and oriented to person, place, and time.  Skin: Skin is dry.  Psychiatric: She has a normal mood and affect. Her behavior is normal.          Assessment & Plan:   obese/ abnormal weight gain- we'll restart phentermine and Topamax. Discuss these medications are only as good as her compliance with them in her additional exercise and diet changes. Follow-up in one month.   Hypertension- rechecked and blood pressure was better. Pt not taking HCTZ. Discussed for patient to take HCTZ daily. Recheck in one month.   GERD- refilled Protonix for one year.

## 2015-12-15 ENCOUNTER — Encounter: Payer: Self-pay | Admitting: Physician Assistant

## 2015-12-15 ENCOUNTER — Ambulatory Visit (INDEPENDENT_AMBULATORY_CARE_PROVIDER_SITE_OTHER): Payer: 59 | Admitting: Physician Assistant

## 2015-12-15 VITALS — BP 150/55 | HR 91 | Ht 65.0 in | Wt 219.0 lb

## 2015-12-15 DIAGNOSIS — F1990 Other psychoactive substance use, unspecified, uncomplicated: Secondary | ICD-10-CM

## 2015-12-15 DIAGNOSIS — K279 Peptic ulcer, site unspecified, unspecified as acute or chronic, without hemorrhage or perforation: Secondary | ICD-10-CM | POA: Diagnosis not present

## 2015-12-15 DIAGNOSIS — F558 Abuse of other non-psychoactive substances: Secondary | ICD-10-CM | POA: Diagnosis not present

## 2015-12-15 DIAGNOSIS — R1013 Epigastric pain: Secondary | ICD-10-CM

## 2015-12-15 LAB — CBC WITH DIFFERENTIAL/PLATELET
BASOS ABS: 0 10*3/uL (ref 0.0–0.1)
BASOS PCT: 0 % (ref 0–1)
EOS ABS: 0.1 10*3/uL (ref 0.0–0.7)
EOS PCT: 1 % (ref 0–5)
HCT: 37.8 % (ref 36.0–46.0)
Hemoglobin: 12.8 g/dL (ref 12.0–15.0)
LYMPHS ABS: 2.6 10*3/uL (ref 0.7–4.0)
Lymphocytes Relative: 27 % (ref 12–46)
MCH: 26.3 pg (ref 26.0–34.0)
MCHC: 33.9 g/dL (ref 30.0–36.0)
MCV: 77.6 fL — AB (ref 78.0–100.0)
MPV: 9.6 fL (ref 8.6–12.4)
Monocytes Absolute: 0.7 10*3/uL (ref 0.1–1.0)
Monocytes Relative: 7 % (ref 3–12)
NEUTROS PCT: 65 % (ref 43–77)
Neutro Abs: 6.3 10*3/uL (ref 1.7–7.7)
PLATELETS: 285 10*3/uL (ref 150–400)
RBC: 4.87 MIL/uL (ref 3.87–5.11)
RDW: 14.9 % (ref 11.5–15.5)
WBC: 9.7 10*3/uL (ref 4.0–10.5)

## 2015-12-15 LAB — COMPLETE METABOLIC PANEL WITH GFR
ALT: 12 U/L (ref 6–29)
AST: 15 U/L (ref 10–35)
Albumin: 4.4 g/dL (ref 3.6–5.1)
Alkaline Phosphatase: 47 U/L (ref 33–130)
BUN: 11 mg/dL (ref 7–25)
CALCIUM: 9.6 mg/dL (ref 8.6–10.4)
CHLORIDE: 102 mmol/L (ref 98–110)
CO2: 24 mmol/L (ref 20–31)
CREATININE: 0.72 mg/dL (ref 0.50–1.05)
GFR, Est African American: >89 mL/min (ref >=60)
GFR, Est Non African American: >89 mL/min (ref >=60)
Glucose, Bld: 97 mg/dL (ref 65–99)
POTASSIUM: 3.5 mmol/L (ref 3.5–5.3)
SODIUM: 139 mmol/L (ref 135–146)
Total Bilirubin: 0.6 mg/dL (ref 0.2–1.2)
Total Protein: 7.7 g/dL (ref 6.1–8.1)

## 2015-12-15 LAB — LIPASE: LIPASE: 8 U/L (ref 7–60)

## 2015-12-15 MED ORDER — RANITIDINE HCL 150 MG PO CAPS: 150.0000 mg | ORAL_CAPSULE | Freq: Two times a day (BID) | ORAL | Status: DC

## 2015-12-15 MED ORDER — ALPRAZOLAM 0.5 MG PO TABS: 0.5000 mg | ORAL_TABLET | Freq: Every evening | ORAL | Status: DC | PRN

## 2015-12-15 MED ORDER — GI COCKTAIL ~~LOC~~
30.0000 mL | Freq: Once | ORAL | Status: AC
  Administered 2015-12-15: 30 mL via ORAL

## 2015-12-15 NOTE — Patient Instructions (Addendum)
Peptic Ulcer A peptic ulcer is a sore in the lining of your esophagus (esophageal ulcer), stomach (gastric ulcer), or in the first part of your small intestine (duodenal ulcer). The ulcer causes erosion into the deeper tissue. CAUSES  Normally, the lining of the stomach and the small intestine protects itself from the acid that digests food. The protective lining can be damaged by:  An infection caused by a bacterium called Helicobacter pylori (H. pylori).  Regular use of nonsteroidal anti-inflammatory drugs (NSAIDs), such as ibuprofen or aspirin.  Smoking tobacco. Other risk factors include being older than 50, drinking alcohol excessively, and having a family history of ulcer disease.  SYMPTOMS   Burning pain or gnawing in the area between the chest and the belly button.  Heartburn.  Nausea and vomiting.  Bloating. The pain can be worse on an empty stomach and at night. If the ulcer results in bleeding, it can cause:  Black, tarry stools.  Vomiting of bright red blood.  Vomiting of coffee-ground-looking materials. DIAGNOSIS  A diagnosis is usually made based upon your history and an exam. Other tests and procedures may be performed to find the cause of the ulcer. Finding a cause will help determine the best treatment. Tests and procedures may include:  Blood tests, stool tests, or breath tests to check for the bacterium H. pylori.  An upper gastrointestinal (GI) series of the esophagus, stomach, and small intestine.  An endoscopy to examine the esophagus, stomach, and small intestine.  A biopsy. TREATMENT  Treatment may include:  Eliminating the cause of the ulcer, such as smoking, NSAIDs, or alcohol.  Medicines to reduce the amount of acid in your digestive tract.  Antibiotic medicines if the ulcer is caused by the H. pylori bacterium.  An upper endoscopy to treat a bleeding ulcer.  Surgery if the bleeding is severe or if the ulcer created a hole somewhere in the  digestive system. HOME CARE INSTRUCTIONS   Avoid tobacco, alcohol, and caffeine. Smoking can increase the acid in the stomach, and continued smoking will impair the healing of ulcers.  Avoid foods and drinks that seem to cause discomfort or aggravate your ulcer.  Only take medicines as directed by your caregiver. Do not substitute over-the-counter medicines for prescription medicines without talking to your caregiver.  Keep any follow-up appointments and tests as directed. SEEK MEDICAL CARE IF:   Your do not improve within 7 days of starting treatment.  You have ongoing indigestion or heartburn. SEEK IMMEDIATE MEDICAL CARE IF:   You have sudden, sharp, or persistent abdominal pain.  You have bloody or dark black, tarry stools.  You vomit blood or vomit that looks like coffee grounds.  You become light-headed, weak, or feel faint.  You become sweaty or clammy. MAKE SURE YOU:   Understand these instructions.  Will watch your condition.  Will get help right away if you are not doing well or get worse.   This information is not intended to replace advice given to you by your health care provider. Make sure you discuss any questions you have with your health care provider.   Document Released: 12/01/2000 Document Revised: 12/25/2014 Document Reviewed: 07/03/2012 Elsevier Interactive Patient Education 2016 [employer] Inc[employer]  Food Choices for Peptic Ulcer Disease When you have peptic ulcer disease, the foods you eat and your eating habits are very important. Choosing the right foods can help ease the discomfort of peptic ulcer disease. WHAT GENERAL GUIDELINES DO I NEED TO FOLLOW?  Choose fruits, vegetables,   whole grains, and low-fat meat, fish, and poultry.   Keep a food diary to identify foods that cause symptoms.  Avoid foods that cause irritation or pain. These may be different for different people.  Eat frequent small meals instead of three large meals each day. The pain  may be worse when your stomach is empty.  Avoid eating close to bedtime. WHAT FOODS ARE NOT RECOMMENDED? The following are some foods and drinks that may worsen your symptoms:  Black, white, and red pepper.  Hot sauce.  Chili peppers.  Chili powder.  Chocolate and cocoa.   Alcohol.  Tea, coffee, and cola (regular and decaffeinated). The items listed above may not be a complete list of foods and beverages to avoid. Contact your dietitian for more information.   This information is not intended to replace advice given to you by your health care provider. Make sure you discuss any questions you have with your health care provider.   Document Released: 02/26/2012 Document Revised: 12/09/2013 Document Reviewed: 10/08/2013 Elsevier Interactive Patient Education 2016 Elsevier Inc.  

## 2015-12-15 NOTE — Progress Notes (Signed)
   Subjective:    Patient ID: Joanne Winters, female    DOB: 06/19/1966, 49 y.o.   MRN: [id number]  HPI  Patient is a 49 year old female who presents to the clinic with epigastric pain and diarrhea. She has had symptoms for around 6 days. The pain seems to be localized in between bellybutton and epigastric area. She had diarrhea for a few days but has stopped and had no bowel movement for 3 days. Prilosec seemed to help a little but has not taken daily. Food makes symptoms worse. She has no appetite. She's had a headache for a few days as well. She denies any nausea or vomiting. She has no radiation of pain into her back. Pt does admit to taking goody's powder 1-2 timesa a day and mobic.      Review of Systems  All other systems reviewed and are negative.      Objective:   Physical Exam  Constitutional: She is oriented to person, place, and time. She appears well-developed and well-nourished.  HENT:  Head: Normocephalic and atraumatic.  Cardiovascular: Normal rate, regular rhythm and normal heart sounds.   Pulmonary/Chest: Effort normal and breath sounds normal.  Negative for any CVA tenderness.   Abdominal: Soft.  I did not hear any bowel sounds, Tenderness worse over epigastric area with rebound. Some diffuse tenderness over Left and right upper quadrant.  No guarding.   Neurological: She is alert and oriented to person, place, and time.  Psychiatric: She has a normal mood and affect. Her behavior is normal.          Assessment & Plan:  PUD/epigastric pain/NSAID use excessive-StOP NSAIDs all of them mobic and goody's. GI cocktail given in office today. Start protonix daily and zantac twice a day for at least 8 weeks. If not improving in next 2 weeks follow up. Will check cbc, cmp, lipase to make sure no other potential causes of pain.

## 2016-01-03 ENCOUNTER — Ambulatory Visit: Payer: 59 | Admitting: Physician Assistant

## 2016-01-07 ENCOUNTER — Ambulatory Visit: Payer: 59 | Admitting: Physician Assistant

## 2016-01-10 ENCOUNTER — Ambulatory Visit (INDEPENDENT_AMBULATORY_CARE_PROVIDER_SITE_OTHER): Payer: 59 | Admitting: Physician Assistant

## 2016-01-10 ENCOUNTER — Encounter: Payer: Self-pay | Admitting: Physician Assistant

## 2016-01-10 VITALS — BP 124/73 | HR 75 | Temp 98.7°F | Ht 65.0 in | Wt 225.8 lb

## 2016-01-10 DIAGNOSIS — R635 Abnormal weight gain: Secondary | ICD-10-CM

## 2016-01-10 DIAGNOSIS — E669 Obesity, unspecified: Secondary | ICD-10-CM

## 2016-01-10 MED ORDER — LORCASERIN HCL 10 MG PO TABS: ORAL_TABLET | ORAL | Status: DC

## 2016-01-10 NOTE — Progress Notes (Signed)
   Subjective:    Patient ID: Joanne Winters, female    DOB: Jan 05, 1966, 50 y.o.   MRN: [id number]  HPI  Pt is a 50 yo female who presents to the clinic to follow up on weight. She is on phentermine and topamax. She is exercising 3-4 times a week and eating less. She feels like her clothes fit looser. She has actually gained 6lbs. She has been more constipated. Denies any insomnia.   Review of Systems  All other systems reviewed and are negative.      Objective:   Physical Exam  Constitutional: She is oriented to person, place, and time. She appears well-developed and well-nourished.  Obese.   HENT:  Head: Normocephalic and atraumatic.  Cardiovascular: Normal rate, regular rhythm and normal heart sounds.   Pulmonary/Chest: Effort normal and breath sounds normal. She has no wheezes.  Neurological: She is alert and oriented to person, place, and time.  Psychiatric: She has a normal mood and affect. Her behavior is normal.          Assessment & Plan:  Obesity/abnormal weight gain- gained weight despite being on phentermine. Stop phentermine. Increase water and miralax for constipation.  Will try belviq with topamax. Continue regular exercise and keep to a 1500 calorie diet. Follow up in 3 months.

## 2016-01-13 ENCOUNTER — Other Ambulatory Visit: Payer: Self-pay | Admitting: Physician Assistant

## 2016-07-07 ENCOUNTER — Encounter: Payer: 59 | Admitting: Physician Assistant

## 2016-07-14 ENCOUNTER — Encounter: Payer: Self-pay | Admitting: Physician Assistant

## 2016-07-14 ENCOUNTER — Ambulatory Visit (INDEPENDENT_AMBULATORY_CARE_PROVIDER_SITE_OTHER): Payer: 59 | Admitting: Physician Assistant

## 2016-07-14 ENCOUNTER — Telehealth: Payer: Self-pay | Admitting: Physician Assistant

## 2016-07-14 VITALS — BP 134/69 | HR 78 | Ht 65.0 in | Wt 225.0 lb

## 2016-07-14 DIAGNOSIS — J302 Other seasonal allergic rhinitis: Secondary | ICD-10-CM

## 2016-07-14 DIAGNOSIS — E669 Obesity, unspecified: Secondary | ICD-10-CM

## 2016-07-14 DIAGNOSIS — Z1231 Encounter for screening mammogram for malignant neoplasm of breast: Secondary | ICD-10-CM

## 2016-07-14 DIAGNOSIS — Z131 Encounter for screening for diabetes mellitus: Secondary | ICD-10-CM

## 2016-07-14 DIAGNOSIS — T753XXD Motion sickness, subsequent encounter: Secondary | ICD-10-CM

## 2016-07-14 DIAGNOSIS — Z13220 Encounter for screening for lipoid disorders: Secondary | ICD-10-CM

## 2016-07-14 DIAGNOSIS — F40243 Fear of flying: Secondary | ICD-10-CM | POA: Diagnosis not present

## 2016-07-14 DIAGNOSIS — Z0000 Encounter for general adult medical examination without abnormal findings: Secondary | ICD-10-CM

## 2016-07-14 DIAGNOSIS — Z9109 Other allergy status, other than to drugs and biological substances: Secondary | ICD-10-CM

## 2016-07-14 DIAGNOSIS — L918 Other hypertrophic disorders of the skin: Secondary | ICD-10-CM

## 2016-07-14 MED ORDER — MONTELUKAST SODIUM 10 MG PO TABS: 10.0000 mg | ORAL_TABLET | Freq: Every day | ORAL | 4 refills | Status: DC

## 2016-07-14 MED ORDER — ALBUTEROL SULFATE HFA 108 (90 BASE) MCG/ACT IN AERS: 1.0000 | INHALATION_SPRAY | Freq: Four times a day (QID) | RESPIRATORY_TRACT | 2 refills | Status: DC | PRN

## 2016-07-14 MED ORDER — ALPRAZOLAM 0.5 MG PO TABS: ORAL_TABLET | ORAL | 0 refills | Status: DC

## 2016-07-14 MED ORDER — HYDROCHLOROTHIAZIDE 25 MG PO TABS: ORAL_TABLET | ORAL | 4 refills | Status: DC

## 2016-07-14 MED ORDER — SCOPOLAMINE 1 MG/3DAYS TD PT72: 1.0000 | MEDICATED_PATCH | TRANSDERMAL | 12 refills | Status: DC

## 2016-07-14 NOTE — Patient Instructions (Signed)
Vitamin D 800 units and calcium 1500mg .  Get mammogram.

## 2016-07-16 ENCOUNTER — Encounter: Payer: Self-pay | Admitting: Physician Assistant

## 2016-07-16 DIAGNOSIS — F40243 Fear of flying: Secondary | ICD-10-CM | POA: Insufficient documentation

## 2016-07-16 DIAGNOSIS — T753XXA Motion sickness, initial encounter: Secondary | ICD-10-CM | POA: Insufficient documentation

## 2016-07-16 DIAGNOSIS — L918 Other hypertrophic disorders of the skin: Secondary | ICD-10-CM | POA: Insufficient documentation

## 2016-07-16 NOTE — Progress Notes (Signed)
Subjective:     Joanne Winters is a 50 y.o. female and is here for a comprehensive physical exam. The patient reports problems - she requests a few xanax for upcoming travel due to free of flying and tight spaces. she also would like something for motion sickness. she has used patches in the past past and worked well. .  Social History   Social History  . Marital status: Divorced    Spouse name: N/A  . Number of children: N/A  . Years of education: N/A   Occupational History  . Not on file.   Social History Main Topics  . Smoking status: Never Smoker  . Smokeless tobacco: Never Used  . Alcohol use No  . Drug use: No  . Sexual activity: Not on file   Other Topics Concern  . Not on file   Social History Narrative  . No narrative on file   Health Maintenance  Topic Date Due  . HIV Screening  08/20/1980  . COLONOSCOPY  08/21/2015  . MAMMOGRAM  01/07/2016  . INFLUENZA VACCINE  08/08/2016 (Originally 07/18/2016)  . TETANUS/TDAP  12/18/2017  . PAP SMEAR  12/22/2017    The following portions of the patient's history were reviewed and updated as appropriate: allergies, current medications, past family history, past medical history, past social history, past surgical history and problem list.  Review of Systems Pertinent items noted in HPI and remainder of comprehensive ROS otherwise negative.   Objective:    BP 134/69   Pulse 78   Ht  (1.651 m)   Wt 225 lb (102.1 kg)   BMI 37.44 kg/m  General appearance: alert, cooperative, appears stated age and mildly obese Head: Normocephalic, without obvious abnormality, atraumatic Eyes: conjunctivae/corneas clear. PERRL, EOM's intact. Fundi benign. Ears: normal TM's and external ear canals both ears Nose: Nares normal. Septum midline. Mucosa normal. No drainage or sinus tenderness. Throat: lips, mucosa, and tongue normal; teeth and gums normal Neck: no adenopathy, no carotid bruit, no JVD, supple, symmetrical, trachea midline and  thyroid not enlarged, symmetric, no tenderness/mass/nodules Back: symmetric, no curvature. ROM normal. No CVA tenderness. Lungs: clear to auscultation bilaterally Heart: regular rate and rhythm, S1, S2 normal, no murmur, click, rub or gallop Abdomen: soft, non-tender; bowel sounds normal; no masses,  no organomegaly Extremities: extremities normal, atraumatic, no cyanosis or edema Pulses: 2+ and symmetric Skin: Skin color, texture, turgor normal. No rashes or lesions right axilla pedunculated skin tag with 1cm by 1cm acrochordon.  Lymph nodes: Cervical, supraclavicular, and axillary nodes normal. Neurologic: Alert and oriented X 3, normal strength and tone. Normal symmetric reflexes. Normal coordination and gait   Pelvic: no pain or masses palpated via bi-manuel exam.    Assessment:    Healthy female exam.      Plan:    CPE- pap up to date. Mammogram ordered. Screening labs ordered. Discussed vitamin d 800 units and calcium .  Last colonscopy 4 years ago due to family hx of colon cancer. Will find out when she needs next at 49 or just 10 years.   Brings in nicotine testing for work but failed to bring in test strip. Will come back for testing on Monday.   Obesity- TSH ordered. Pt has tried phentermine and belviq. Discussed saxenda but not interested today.   Environmental allergies- epipen and singular refilled. Continue to take zyrtec daily.   Skin tag- Skin Tag Removal Procedure Note  Pre-operative Diagnosis: Classic skin tags (acrochordon)  Post-operative Diagnosis: Classic skin  tags (acrochordon)  Locations:right axilla   Indications: irritation  Anesthesia: none  Procedure Details  The risks (including bleeding and infection) and benefits of the procedure and Verbal informed consent obtained. Using sterile iris scissors, multiple skin tags were snipped off at their bases after cleansing with Betadine.  Bleeding was controlled by pressure.    Findings: Pathognomonic benign lesions  not sent for pathological exam.  Condition: Stable  Complications: none.  Plan: 1. Instructed to keep the wounds dry and covered for 24-48h and clean thereafter. 2. Warning signs of infection were reviewed.   3. Recommended that the patient use OTC acetaminophen as needed for pain.  4. Return as needed.  Motion sickness- scoplamine patches ordered.   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   See After Visit Summary for Counseling Recommendations

## 2016-07-18 NOTE — Telephone Encounter (Signed)
Left message on patient's voicemail for a return call. We will have to find out where she had the colonoscopy. [facility] will not give recommendations without last colonoscopy report.

## 2016-07-20 ENCOUNTER — Ambulatory Visit (INDEPENDENT_AMBULATORY_CARE_PROVIDER_SITE_OTHER): Payer: 59 | Admitting: Sports Medicine

## 2016-07-20 VITALS — BP 138/79 | HR 80

## 2016-07-20 DIAGNOSIS — Z1389 Encounter for screening for other disorder: Secondary | ICD-10-CM | POA: Diagnosis not present

## 2016-07-20 DIAGNOSIS — Z0000 Encounter for general adult medical examination without abnormal findings: Secondary | ICD-10-CM

## 2016-07-20 LAB — COMPLETE METABOLIC PANEL WITH GFR
ALT: 9 U/L (ref 6–29)
AST: 12 U/L (ref 10–35)
Albumin: 4.2 g/dL (ref 3.6–5.1)
Alkaline Phosphatase: 52 U/L (ref 33–130)
BILIRUBIN TOTAL: 0.2 mg/dL (ref 0.2–1.2)
BUN: 12 mg/dL (ref 7–25)
CHLORIDE: 108 mmol/L (ref 98–110)
CO2: 23 mmol/L (ref 20–31)
Calcium: 9.2 mg/dL (ref 8.6–10.4)
Creat: 0.69 mg/dL (ref 0.50–1.05)
Glucose, Bld: 98 mg/dL (ref 65–99)
Potassium: 4.2 mmol/L (ref 3.5–5.3)
SODIUM: 141 mmol/L (ref 135–146)
Total Protein: 6.9 g/dL (ref 6.1–8.1)

## 2016-07-20 LAB — LIPID PANEL
CHOL/HDL RATIO: 3.2 ratio (ref <=5.0)
CHOLESTEROL: 134 mg/dL (ref 125–200)
HDL: 42 mg/dL — AB (ref >=46)
LDL Cholesterol: 77 mg/dL (ref <130)
TRIGLYCERIDES: 73 mg/dL (ref <150)
VLDL: 15 mg/dL (ref <30)

## 2016-07-20 LAB — TSH: TSH: 1.01 mIU/L

## 2016-07-20 NOTE — Progress Notes (Signed)
Pt in today to do the nicotine saliva test required by her employer.  Her employer did not give her all the supplies she needed to complete this during her most recent CPE.  Donne Anon, CMA

## 2016-08-07 ENCOUNTER — Other Ambulatory Visit: Payer: Self-pay | Admitting: *Deleted

## 2016-08-07 MED ORDER — HYDROCHLOROTHIAZIDE 25 MG PO TABS: ORAL_TABLET | ORAL | 4 refills | Status: DC

## 2016-08-07 MED ORDER — PANTOPRAZOLE SODIUM 40 MG PO TBEC: 40.0000 mg | DELAYED_RELEASE_TABLET | Freq: Every day | ORAL | 4 refills | Status: DC

## 2016-08-16 ENCOUNTER — Ambulatory Visit: Payer: 59

## 2016-10-17 ENCOUNTER — Ambulatory Visit (INDEPENDENT_AMBULATORY_CARE_PROVIDER_SITE_OTHER): Payer: 59 | Admitting: Physician Assistant

## 2016-10-17 ENCOUNTER — Ambulatory Visit (INDEPENDENT_AMBULATORY_CARE_PROVIDER_SITE_OTHER): Payer: 59

## 2016-10-17 VITALS — BP 130/70 | HR 78 | Wt 224.0 lb

## 2016-10-17 DIAGNOSIS — M79644 Pain in right finger(s): Secondary | ICD-10-CM

## 2016-10-17 DIAGNOSIS — G629 Polyneuropathy, unspecified: Secondary | ICD-10-CM

## 2016-10-17 MED ORDER — GABAPENTIN 100 MG PO CAPS: 100.0000 mg | ORAL_CAPSULE | Freq: Three times a day (TID) | ORAL | 3 refills | Status: DC

## 2016-10-17 NOTE — Progress Notes (Signed)
Reviewed with patient in office visit.

## 2016-10-17 NOTE — Patient Instructions (Signed)

## 2016-10-17 NOTE — Progress Notes (Signed)
   Subjective:    Patient ID: Joanne Winters, female    DOB: 07/01/1966, 50 y.o.   MRN: [id number]  HPI  Pt is a 50 yo female who presents to the clinic with right thumb pain for last 2 weeks. She does not remember an injury but she does work around heavy machines and feels like she could have hit it on a machine. Pain worsens with movement of thumb. Not tried anything to make better. Denies any other finger involvement. Denies any numbness or tingling of the hand.   She has also started to have some burning and tingling or both feet but right is much worse than left. Not tried anything except rest. Burning is over plantar surface and heel. No pain with walking. No low back pain. No numbness of feet. Pt is not a diabetic.    Review of Systems  All other systems reviewed and are negative.      Objective:   Physical Exam  Constitutional: She is oriented to person, place, and time. She appears well-developed and well-nourished.  HENT:  Head: Normocephalic and atraumatic.  Cardiovascular: Normal rate, regular rhythm and normal heart sounds.   Pulmonary/Chest: Effort normal and breath sounds normal.  Musculoskeletal:  Normal ROM of both feet.  Great pedal pulses.  No swelling of extremities.  No pain to palpation over fascia or heel.   Right thumb pain to palpation over right thenar eminence and anterior snuff box. Negative finklestien.  Pain with thumb extension resistance.  No joint tenderness.  No warmth.   Neurological: She is alert and oriented to person, place, and time.  Psychiatric: She has a normal mood and affect. Her behavior is normal.          Assessment & Plan:  [name] Kitchen.[name] Kitchen.Joanne Winters was seen today for burning sensation  in the feet and back.  Diagnoses and all orders for this visit:  Pain of right thumb -     DG Hand Complete Right  Neuropathy (HCC) -     gabapentin (NEURONTIN) 100 MG capsule; Take 1 capsule (100 mg total) by mouth 3 (three) times daily.   There does  appear to be a distal fracture of thumb but there is no point tenderness over distal thumb. Pain is over base of thumb and thenar eminence. No fracture seen in this area. Could be a strain to flexor pollicis longus but seems to be less likely since no flinklestein, could be the start of carpel tunnel, there could also be some extensor tendon strain as well. Placed in brace for immobilization, Mobic to start daily, rest/ice. If not improving or worsening in next 2 week follow up with sports medicine.   Discussed b12 and TSH for neuropathy symptoms. Pt declines today. Certainly cannot exclude low back as cause;however, pt is not complaining of low back pain. Will start with gabapentin and see if helps. Follow up in 4 weeks. Discussed side effects.

## 2016-10-20 ENCOUNTER — Encounter: Payer: Self-pay | Admitting: Physician Assistant

## 2016-10-20 DIAGNOSIS — G629 Polyneuropathy, unspecified: Secondary | ICD-10-CM | POA: Insufficient documentation

## 2016-10-20 DIAGNOSIS — M654 Radial styloid tenosynovitis [de Quervain]: Secondary | ICD-10-CM | POA: Insufficient documentation

## 2016-10-25 LAB — HTLV-I/II ANTIBODIES, QUAL.: HTLV I/II Ab: NEGATIVE

## 2016-10-25 LAB — SYPHILIS ANTIBODY, IGM, ELISA: Syphilis Antibody, IgM, ELISA: POSITIVE

## 2016-10-25 LAB — HIV-1/2 AB - DIFFERENTIATION: HIV 1/2 ANTIBODIES: NEGATIVE

## 2016-11-01 LAB — HEPATITIS C ANTIBODY, REFLEX
Hep B Core Ab, IgM: NEGATIVE
Hep B Surface Ab, Qual: NEGATIVE
Hep C Virus Ab: NEGATIVE

## 2016-11-06 ENCOUNTER — Ambulatory Visit (INDEPENDENT_AMBULATORY_CARE_PROVIDER_SITE_OTHER): Payer: 59 | Admitting: Physician Assistant

## 2016-11-06 ENCOUNTER — Encounter: Payer: Self-pay | Admitting: Physician Assistant

## 2016-11-06 VITALS — BP 172/89 | HR 87 | Ht 65.0 in | Wt 231.0 lb

## 2016-11-06 DIAGNOSIS — A530 Latent syphilis, unspecified as early or late: Secondary | ICD-10-CM

## 2016-11-06 NOTE — Patient Instructions (Signed)
Syphilis Syphilis is an infectious disease. It can cause serious complications if left untreated. What are the causes? Syphilis is caused by a type of bacteria called Treponema pallidum. It is most commonly spread through sexual contact. Syphilis may also spread to a fetus through the blood of the mother. What are the signs or symptoms? Symptoms vary depending on the stage of the disease. Some symptoms may disappear without treatment. However, this does not mean that the infection is gone. One form of syphilis (called latent syphilis) has no symptoms. Primary Syphilis  Painless sores (chancres) in and around the genital organs and mouth.  Swollen lymph nodes near the sores. Secondary Syphilis  A rash or sores over any portion of the body, including the palms of the hands and soles of the feet.  Fever.  Headache.  Sore throat.  Swollen lymph nodes.  New sores in the mouth or on the genitals.  Feeling generally ill.  Having pain in the joints. Tertiary Syphilis The third stage of syphilis involves severe damage to different organs in the body, such as the brain, spinal cord, and heart. Signs and symptoms may include:  Dementia.  Personality and mood changes.  Difficulty walking.  Heart failure.  Fainting.  Enlargement (aneurysm) of the aorta.  Tumors of the skin, bones, or liver.  Muscle weakness.  Sudden "lightning" pains, numbness, or tingling.  Problems with coordination.  Vision changes.  How is this diagnosed?  A physical exam will be done.  Blood tests will be done to confirm the diagnosis.  If the disease is in the first or second stages, a fluid (drainage) sample from a sore or rash may be examined under a microscope to detect the disease-causing bacteria.  Fluid around the spine may need to be examined to detect brain damage or inflammation of the brain lining (meningitis).  If the disease is in the third stage, X-rays, CT scans, MRIs,  echocardiograms, ultrasounds, or cardiac catheterization may also be done to detect disease of the heart, aorta, or brain. How is this treated? Syphilis can be cured with antibiotic medicine if a diagnosis is made early. During the first day of treatment, you may experience fever, chills, headache, nausea, or aching all over your body. This is a normal reaction to the antibiotics. Follow these instructions at home:  Take your antibiotic medicine as directed by your health care provider. Finish the antibiotic even if you start to feel better. Incomplete treatment will put you at risk for continued infection and could be life threatening.  Take medicines only as directed by your health care provider.  Do not have sexual intercourse until your treatment is completed or as directed by your health care provider.  Inform your recent sexual partners that you were diagnosed with syphilis. They need to seek care and treatment, even if they have no symptoms. It is necessary that all your sexual partners be tested for infection and treated if they have the disease.  Keep all follow-up visits as directed by your health care provider. It is important to keep all your appointments.  If your test results are not ready during your visit, make an appointment with your health care provider to find out the results. Do not assume everything is normal if you have not heard from your health care provider or the medical facility. It is your responsibility to get your test results. Contact a health care provider if:  You continue to have any of the following 24 hours after beginning   treatment: ? Fever. ? Chills. ? Headache. ? Nausea. ? Aching all over your body.  You have symptoms of an allergic reaction to medicine, such as: ? Chills. ? A headache. ? Light-headedness. ? A new rash (especially hives). ? Difficulty breathing. This information is not intended to replace advice given to you by your health care  provider. Make sure you discuss any questions you have with your health care provider. Document Released: 09/24/2013 Document Revised: 05/11/2016 Document Reviewed: 06/24/2013 Elsevier Interactive Patient Education  2017 Elsevier Inc.  

## 2016-11-06 NOTE — Progress Notes (Signed)
   Subjective:    Patient ID: Domingo Dimesearlie Woodburn, female    DOB: 01/15/1966, 50 y.o.   MRN: [id number]  HPI Pt is a 50 yo lesbian female who presents to the clinic after getting notification that she tested positive for syphilis after attempting to give blood on 10/25/16. She has been in monogomus relationship for 16 years with the same person. Her last STD testing was 7 years ago and negative. She has no symptoms. She declines any sores, discharge, memory changes. She is very upset today. She is confused as well because her blood type was A+ but she always thought it was 0+. She wonders if lab got mixed up.      Review of Systems  All other systems reviewed and are negative.      Objective:   Physical Exam  Constitutional: She appears well-developed and well-nourished.  HENT:  Head: Normocephalic and atraumatic.  Cardiovascular: Normal rate, regular rhythm and normal heart sounds.   Psychiatric:  Very tearful.           Assessment & Plan:  [name] Kitchen.[name] Kitchen.Diagnoses and all orders for this visit:  Positive RPR test -     RPR -     T.pallidum Ab, Total  will confirm syphilis with labs.  Discussed transmission and treatment.  Encouraged partner to also get tested.

## 2016-11-07 LAB — RPR

## 2016-11-07 NOTE — Progress Notes (Signed)
RPR was negative with our labs. I am still waiting for antibodies to come back.  It appears that there could be a lab error.

## 2016-11-08 ENCOUNTER — Encounter: Payer: Self-pay | Admitting: Physician Assistant

## 2016-11-08 ENCOUNTER — Other Ambulatory Visit: Payer: Self-pay | Admitting: Physician Assistant

## 2016-11-08 DIAGNOSIS — A530 Latent syphilis, unspecified as early or late: Secondary | ICD-10-CM

## 2016-11-08 DIAGNOSIS — A529 Late syphilis, unspecified: Secondary | ICD-10-CM | POA: Insufficient documentation

## 2016-11-08 LAB — T.PALLIDUM AB, TOTAL: T PALLIDUM ANTIBODIES (TP-PA): REACTIVE — AB

## 2016-11-09 LAB — VDRL: VDRL, SERUM: NONREACTIVE

## 2016-11-13 ENCOUNTER — Encounter: Payer: Self-pay | Admitting: Physician Assistant

## 2016-11-13 ENCOUNTER — Other Ambulatory Visit: Payer: Self-pay | Admitting: Physician Assistant

## 2016-11-13 NOTE — Progress Notes (Signed)
From our blood work it appears she has been treated for syphilis in the past maybe inadvertently due to common use of this abx. Your antibodies are positive but your screening RPR non-reactive. I believe that I would still treat to confirm fully treated and that would be PCN shot once weekly for 3 weeks.   Amber can you get patient to read to you results. Results not scanned in yet. Or she could bring a copy by.

## 2016-11-14 ENCOUNTER — Ambulatory Visit (INDEPENDENT_AMBULATORY_CARE_PROVIDER_SITE_OTHER): Payer: 59 | Admitting: Physician Assistant

## 2016-11-14 VITALS — BP 135/83 | HR 76

## 2016-11-14 DIAGNOSIS — R789 Finding of unspecified substance, not normally found in blood: Secondary | ICD-10-CM | POA: Diagnosis not present

## 2016-11-14 DIAGNOSIS — A539 Syphilis, unspecified: Secondary | ICD-10-CM

## 2016-11-14 LAB — ABO AND RH: Rh Type: POSITIVE

## 2016-11-14 MED ORDER — PENICILLIN G BENZATHINE & PROC 1200000 UNIT/2ML IM SUSP
1.2000 10*6.[IU] | Freq: Once | INTRAMUSCULAR | Status: AC
  Administered 2016-11-14: 1.2 10*6.[IU] via INTRAMUSCULAR

## 2016-11-14 NOTE — Progress Notes (Signed)
Confirmed patient is A positive.

## 2016-11-14 NOTE — Progress Notes (Signed)
Pt is here to get a penicillin injection. Denies any past allergic reaction with oral penicillin. Pt tolerated injection well in the RUOQ without complications. Pt advised to make next appointment in 1 week.

## 2016-11-15 LAB — VDRL: VDRL, Serum: NONREACTIVE

## 2016-11-20 ENCOUNTER — Ambulatory Visit (INDEPENDENT_AMBULATORY_CARE_PROVIDER_SITE_OTHER): Payer: 59 | Admitting: Physician Assistant

## 2016-11-20 VITALS — BP 133/80 | HR 77 | Temp 97.8°F | Wt 230.0 lb

## 2016-11-20 DIAGNOSIS — A529 Late syphilis, unspecified: Secondary | ICD-10-CM

## 2016-11-20 MED ORDER — PENICILLIN G BENZATHINE & PROC 900000-300000 UNIT/2ML IM SUSP
1.2000 10*6.[IU] | Freq: Once | INTRAMUSCULAR | Status: AC
  Administered 2016-11-20: 1.2 10*6.[IU] via INTRAMUSCULAR

## 2016-11-20 NOTE — Progress Notes (Signed)
Patient came into clinic today for second Penicillin injection. Patient is getting these for a diagnosis of Syphilis. Per PCP, Pt is to get Penicillin 1.2 million units weekly for 3 weeks. Patient reports no negative side effects from first injection. Patient tolerated second injection today in LUOQ well, no immediate complications. Did have Patient sit in room for monitoring after administration. Advised to return next week for 3rd and final injection. Verbalized understanding. No further questions.

## 2016-11-27 ENCOUNTER — Encounter: Payer: Self-pay | Admitting: Physician Assistant

## 2016-11-27 ENCOUNTER — Ambulatory Visit (INDEPENDENT_AMBULATORY_CARE_PROVIDER_SITE_OTHER): Payer: 59 | Admitting: Physician Assistant

## 2016-11-27 VITALS — BP 130/80 | HR 80

## 2016-11-27 DIAGNOSIS — A529 Late syphilis, unspecified: Secondary | ICD-10-CM | POA: Diagnosis not present

## 2016-11-27 MED ORDER — PENICILLIN G BENZATHINE & PROC 1200000 UNIT/2ML IM SUSP
1.2000 10*6.[IU] | Freq: Once | INTRAMUSCULAR | Status: AC
  Administered 2016-11-27: 1.2 10*6.[IU] via INTRAMUSCULAR

## 2016-11-27 NOTE — Progress Notes (Signed)
Pt is here for her last penicillin injection. Pt tolerated injection well in the RUOQ.

## 2016-12-14 ENCOUNTER — Other Ambulatory Visit: Payer: Self-pay | Admitting: Osteopathic Medicine

## 2016-12-14 ENCOUNTER — Ambulatory Visit (INDEPENDENT_AMBULATORY_CARE_PROVIDER_SITE_OTHER): Payer: 59 | Admitting: Osteopathic Medicine

## 2016-12-14 ENCOUNTER — Encounter: Payer: Self-pay | Admitting: Osteopathic Medicine

## 2016-12-14 VITALS — BP 141/87 | HR 77 | Ht 65.0 in | Wt 232.0 lb

## 2016-12-14 DIAGNOSIS — N898 Other specified noninflammatory disorders of vagina: Secondary | ICD-10-CM | POA: Diagnosis not present

## 2016-12-14 LAB — WET PREP, GENITAL
TRICH WET PREP: NONE SEEN
Yeast Wet Prep HPF POC: NONE SEEN

## 2016-12-14 MED ORDER — METRONIDAZOLE 500 MG PO TABS: 500.0000 mg | ORAL_TABLET | Freq: Two times a day (BID) | ORAL | 0 refills | Status: AC

## 2016-12-14 MED ORDER — FLUCONAZOLE 150 MG PO TABS: 150.0000 mg | ORAL_TABLET | Freq: Once | ORAL | 1 refills | Status: AC

## 2016-12-14 NOTE — Progress Notes (Signed)
HPI: Joanne Winters is a 50 y.o. female  who presents to [facility] today, 12/14/16,  for chief complaint of:  Chief Complaint  Patient presents with  . Other    Tingling in vaginal area    Vaginal problem . Context: Recently on antibiotics for syphilis . Location: Vaginal area . Quality: Itching, feeling like skin is swelling, no rash, whitish vaginal discharge . Duration: Couple of days . Modifying factors: No OTC medications tried, does not use douching products . Assoc signs/symptoms: No fever, no abdominal/pelvic pain    Past medical, surgical, social and family history reviewed: Patient Active Problem List   Diagnosis Date Noted  . 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 Neuropathy (HCC) 10/20/2016  . Pain of right thumb 10/20/2016  . 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 Motion sickness 07/16/2016  . Skin tag 07/16/2016  . PUD (peptic ulcer disease) 12/15/2015  . Essential hypertension 12/06/2015  . Pain in lower limb 05/26/2015  . Plantar wart 05/12/2015  . Right calf pain 04/26/2015  . Elevated blood pressure 04/26/2015  . Abnormal weight gain 03/01/2015  . Lateral epicondylitis of right elbow 12/22/2014  . Thyroid goiter 12/22/2014  . Obese 12/22/2014  . GERD (gastroesophageal reflux disease) 10/07/2014  . Seasonal allergies 10/07/2014  . Environmental allergies 10/07/2014  . Obesity 10/07/2014   Past Surgical History:  Procedure Laterality Date  . ABLATION  2013  . CESAREAN SECTION    . gall stone  1995   Social History  Substance Use Topics  . Smoking status: Never Smoker  . Smokeless tobacco: Never Used  . Alcohol use No   Family History  Problem Relation Age of Onset  . Hypertension Mother   . Hypertension Father   . Cancer - Colon Father   . Hypertension Daughter   . Cancer Maternal Grandmother     breast  . Hypertension Maternal Grandmother   . Cancer Paternal Grandmother     breast  . Hypertension Paternal Grandmother       Current medication list and allergy/intolerance information reviewed:   Current Outpatient Prescriptions on File Prior to Visit  Medication Sig Dispense Refill  . albuterol (PROAIR HFA) 108 (90 Base) MCG/ACT inhaler Inhale 1-2 puffs into the lungs every 6 (six) hours as needed for wheezing or shortness of breath. 1 Inhaler 2  . ALPRAZolam (XANAX) 0.5 MG tablet Take 1-2 tablets as needed for flying and travel. 20 tablet 0  . EPINEPHrine (EPIPEN 2-PAK) 0.3 mg/0.3 mL IJ SOAJ injection     . gabapentin (NEURONTIN) 100 MG capsule Take 1 capsule (100 mg total) by mouth 3 (three) times daily. 90 capsule 3  . hydrochlorothiazide (HYDRODIURIL) 25 MG tablet ONE TABLET BY MOUTH EVERY MORNING FOR BLOOD PRESSURE CONTROL. 90 tablet 4  . meloxicam (MOBIC) 15 MG tablet TAKE 1 TABLET BY MOUTH EVERY DAY TAKE FOR 2 WEEKS THEN AS NEEDED FOR PAIN 30 tablet 1  . montelukast (SINGULAIR) 10 MG tablet Take 1 tablet (10 mg total) by mouth at bedtime. 90 tablet 4  . pantoprazole (PROTONIX) 40 MG tablet Take 1 tablet (40 mg total) by mouth daily. 90 tablet 4   No current facility-administered medications on file prior to visit.    Allergies  Allergen Reactions  . Hydrocodone Other (See Comments)      Review of Systems:  Constitutional: No recent illness  Cardiac: No  chest pain  Respiratory:  No  shortness of breath.  Gastrointestinal: No  abdominal pain  Musculoskeletal: No new myalgia/arthralgia  Skin: No  Rash  GYn: discharge as per HPI   Exam:  BP (!) 141/87   Pulse 77   Ht 5\' 5"  (1.651 m)   Wt 232 lb (105.2 kg)   BMI 38.61 kg/m   Constitutional: VS see above. General Appearance: alert, well-developed, well-nourished, NAD  Eyes: Normal lids and conjunctive, non-icteric sclera  Ears, Nose, Mouth, Throat: MMM, Normal external inspection ears/nares/mouth/lips/gums.  Neck: No masses, trachea midline.   Respiratory: Normal respiratory effort.  Musculoskeletal: Gait normal. Symmetric  and independent movement of all extremities  Neurological: Normal balance/coordination. No tremor.  Skin: warm, dry, intact.   Psychiatric: Normal judgment/insight. Normal mood and affect. Oriented x3.  GYN: No lesions/ulcers to external genitalia, normal urethra, normal vaginal mucosa, thick white discharge, cervix normal without lesions, uterus not enlarged or tender, adnexa no masses and nontender   ASSESSMENT/PLAN: We'll treat as he said infection/possible BV, wet prep for confirmation. We'll also test for gonorrhea chlamydia, particularly in patient with recent treatment for syphilis.  Vaginal discharge - Plan: CT/GC, fluconazole (DIFLUCAN) 150 MG tablet, metroNIDAZOLE (FLAGYL) 500 MG tablet, Wet prep, genital    Patient Instructions  We are treating today for yeast infection and bacterial vaginosis, we are also testing for STD gonorrhea and chlamydia just to be safe, we will call you next week with the STD results.     Visit summary with medication list and pertinent instructions was printed for patient to review. All questions at time of visit were answered - patient instructed to contact office with any additional concerns. ER/RTC precautions were reviewed with the patient. Follow-up plan: [facility] and as needed .

## 2016-12-14 NOTE — Patient Instructions (Addendum)
We are treating today for yeast infection and bacterial vaginosis, we are also testing for STD gonorrhea and chlamydia just to be safe, we will call you next week with the STD results.

## 2016-12-19 LAB — GC/CHLAMYDIA PROBE AMP

## 2017-02-27 ENCOUNTER — Encounter: Payer: Self-pay | Admitting: Physician Assistant

## 2017-02-27 ENCOUNTER — Ambulatory Visit (INDEPENDENT_AMBULATORY_CARE_PROVIDER_SITE_OTHER): Payer: 59 | Admitting: Physician Assistant

## 2017-02-27 VITALS — BP 132/84 | HR 79 | Ht 65.0 in | Wt 227.0 lb

## 2017-02-27 DIAGNOSIS — R1310 Dysphagia, unspecified: Secondary | ICD-10-CM | POA: Diagnosis not present

## 2017-02-27 DIAGNOSIS — Z1211 Encounter for screening for malignant neoplasm of colon: Secondary | ICD-10-CM

## 2017-02-27 DIAGNOSIS — Z1231 Encounter for screening mammogram for malignant neoplasm of breast: Secondary | ICD-10-CM

## 2017-02-27 DIAGNOSIS — E049 Nontoxic goiter, unspecified: Secondary | ICD-10-CM | POA: Diagnosis not present

## 2017-02-27 DIAGNOSIS — Z0000 Encounter for general adult medical examination without abnormal findings: Secondary | ICD-10-CM

## 2017-02-27 LAB — TSH: TSH: 2.57 mIU/L

## 2017-02-27 LAB — T4, FREE: FREE T4: 1 ng/dL (ref 0.8–1.8)

## 2017-02-27 MED ORDER — ALPRAZOLAM 0.5 MG PO TABS: ORAL_TABLET | ORAL | 0 refills | Status: DC

## 2017-02-27 MED ORDER — SCOPOLAMINE 1 MG/3DAYS TD PT72: 1.0000 | MEDICATED_PATCH | TRANSDERMAL | 0 refills | Status: DC

## 2017-02-27 NOTE — Patient Instructions (Signed)
Keeping You Healthy  Get These Tests  Blood Pressure- Have your blood pressure checked by your healthcare provider at least once a year.  Normal blood pressure is 120/80.  Weight- Have your body mass index (BMI) calculated to screen for obesity.  BMI is a measure of body fat based on height and weight.  You can calculate your own BMI at www.nhlbisupport.com/bmi/  Cholesterol- Have your cholesterol checked every year.  Diabetes- Have your blood sugar checked every year if you have high blood pressure, high cholesterol, a family history of diabetes or if you are overweight.  Pap Test - Have a pap test every 1 to 5 years if you have been sexually active.  If you are older than 52 and recent pap tests have been normal you may not need additional pap tests.  In addition, if you have had a hysterectomy  for benign disease additional pap tests are not necessary.  Mammogram-Yearly mammograms are essential for early detection of breast cancer  Screening for Colon Cancer- Colonoscopy starting at age 50. Screening may begin sooner depending on your family history and other health conditions.  Follow up colonoscopy as directed by your Gastroenterologist.  Screening for Osteoporosis- Screening begins at age 65 with bone density scanning, sooner if you are at higher risk for developing Osteoporosis.  Get these medicines  Calcium with Vitamin D- Your body requires 1200-1500 mg of Calcium a day and 800-1000 IU of Vitamin D a day.  You can only absorb 500 mg of Calcium at a time therefore Calcium must be taken in 2 or 3 separate doses throughout the day.  Hormones- Hormone therapy has been associated with increased risk for certain cancers and heart disease.  Talk to your healthcare provider about if you need relief from menopausal symptoms.  Aspirin- Ask your healthcare provider about taking Aspirin to prevent Heart Disease and Stroke.  Get these Immuniztions  Flu shot- Every fall  Pneumonia shot-  Once after the age of 52; if you are younger ask your healthcare provider if you need a pneumonia shot.  Tetanus- Every ten years.  Zostavax- Once after the age of 52 to prevent shingles.  Take these steps  Don't smoke- Your healthcare provider can help you quit. For tips on how to quit, ask your healthcare provider or go to www.smokefree.gov or call 1-800 QUIT-NOW.  Be physically active- Exercise 5 days a week for a minimum of 30 minutes.  If you are not already physically active, start slow and gradually work up to 30 minutes of moderate physical activity.  Try walking, dancing, bike riding, swimming, etc.  Eat a healthy diet- Eat a variety of healthy foods such as fruits, vegetables, whole grains, low fat milk, low fat cheeses, yogurt, lean meats, chicken, fish, eggs, dried beans, tofu, etc.  For more information go to www.thenutritionsource.org  Dental visit- Brush and floss teeth twice daily; visit your dentist twice a year.  Eye exam- Visit your Optometrist or Ophthalmologist yearly.  Drink alcohol in moderation- Limit alcohol intake to one drink or less a day.  Never drink and drive.  Depression- Your emotional health is as important as your physical health.  If you're feeling down or losing interest in things you normally enjoy, please talk to your healthcare provider.  Seat Belts- can save your life; always wear one  Smoke/Carbon Monoxide detectors- These detectors need to be installed on the appropriate level of your home.  Replace batteries at least once a year.  Violence- If   anyone is threatening or hurting you, please tell your healthcare provider.  Living Will/ Health care power of attorney- Discuss with your healthcare provider and family. 

## 2017-02-27 NOTE — Progress Notes (Signed)
Subjective:     Joanne Winters is a [age] y.o. female and is here for a comprehensive physical exam. The patient reports problems - she is going on a cruise and needs nausea patches and xanax for travel.  she is also having some new problems swallowing like she did when her thyroid was enlarged. She had to have some of it resected. She admits to choking up on some foods when eating.   Social History   Social History  . Marital status: Divorced    Spouse name: N/A  . Number of children: N/A  . Years of education: N/A   Occupational History  . Not on file.   Social History Main Topics  . Smoking status: Never Smoker  . Smokeless tobacco: Never Used  . Alcohol use No  . Drug use: No  . Sexual activity: Not on file   Other Topics Concern  . Not on file   Social History Narrative  . No narrative on file   Health Maintenance  Topic Date Due  . HIV Screening  08/20/1980  . COLONOSCOPY  08/21/2015  . MAMMOGRAM  01/07/2016  . INFLUENZA VACCINE  07/18/2017 (Originally 07/18/2016)  . TETANUS/TDAP  12/18/2017  . PAP SMEAR  12/22/2017    The following portions of the patient's history were reviewed and updated as appropriate: allergies, current medications, past family history, past medical history, past social history, past surgical history and problem list.  Review of Systems Pertinent items noted in HPI and remainder of comprehensive ROS otherwise negative.   Objective:    BP 132/84   Pulse 79   Ht 5\' 5"  (1.651 m)   Wt 227 lb (103 kg)   BMI 37.77 kg/m  General appearance: alert, cooperative and appears stated age Head: Normocephalic, without obvious abnormality, atraumatic Eyes: conjunctivae/corneas clear. PERRL, EOM's intact. Fundi benign. Ears: normal TM's and external ear canals both ears Nose: Nares normal. Septum midline. Mucosa normal. No drainage or sinus tenderness. Throat: lips, mucosa, and tongue normal; teeth and gums normal Neck: no adenopathy, no carotid bruit, no  JVD, supple, symmetrical, trachea midline and thyroid not enlarged, symmetric, no tenderness/mass/nodules Back: symmetric, no curvature. ROM normal. No CVA tenderness. Lungs: clear to auscultation bilaterally Heart: regular rate and rhythm, S1, S2 normal, no murmur, click, rub or gallop Abdomen: soft, non-tender; bowel sounds normal; no masses,  no organomegaly Pelvic: cervix normal in appearance, external genitalia normal, no adnexal masses or tenderness, no cervical motion tenderness, uterus normal size, shape, and consistency and vagina normal without discharge Extremities: extremities normal, atraumatic, no cyanosis or edema Pulses: 2+ and symmetric Skin: Skin color, texture, turgor normal. No rashes or lesions Lymph nodes: Cervical, supraclavicular, and axillary nodes normal. Neurologic: Alert and oriented X 3, normal strength and tone. Normal symmetric reflexes. Normal coordination and gait    Assessment:    Healthy female exam.      Plan:    [name] Kitchen.[name] Kitchen.[place] was seen today for annual exam.  Diagnoses and all orders for this visit:  Visit for screening mammogram -     MM DIGITAL SCREENING BILATERAL; Future -     MM DIGITAL SCREENING BILATERAL  Colon cancer screening -     Ambulatory referral to Gastroenterology  Dysphagia, unspecified type -     TSH -     T4, free -     US SOFT TISSUE NECK; Future  Thyroid goiter -     US SOFT TISSUE NECK; Future  Routine physical examination -  Ambulatory referral to Gastroenterology -     MM DIGITAL SCREENING BILATERAL; Future -     MM DIGITAL SCREENING BILATERAL  Other orders -     ALPRAZolam (XANAX) 0.5 MG tablet; Take 1-2 tablets as needed for flying and travel. -     scopolamine (TRANSDERM-SCOP, 1.5 MG,) 1 MG/3DAYS; Place 1 patch (1.5 mg total) onto the skin every 3 (three) days.   If thyroid normal then consider GI work up.  See After Visit Summary for Counseling Recommendations

## 2017-02-28 ENCOUNTER — Ambulatory Visit (INDEPENDENT_AMBULATORY_CARE_PROVIDER_SITE_OTHER): Payer: 59

## 2017-02-28 ENCOUNTER — Encounter: Payer: Self-pay | Admitting: Physician Assistant

## 2017-02-28 DIAGNOSIS — R1310 Dysphagia, unspecified: Secondary | ICD-10-CM

## 2017-02-28 DIAGNOSIS — E042 Nontoxic multinodular goiter: Secondary | ICD-10-CM | POA: Diagnosis not present

## 2017-02-28 DIAGNOSIS — E049 Nontoxic goiter, unspecified: Secondary | ICD-10-CM

## 2017-03-11 ENCOUNTER — Other Ambulatory Visit: Payer: Self-pay | Admitting: Physician Assistant

## 2017-03-11 DIAGNOSIS — G629 Polyneuropathy, unspecified: Secondary | ICD-10-CM

## 2017-03-23 ENCOUNTER — Ambulatory Visit: Payer: 59

## 2017-04-06 ENCOUNTER — Ambulatory Visit: Payer: 59

## 2017-04-09 ENCOUNTER — Telehealth: Payer: Self-pay

## 2017-04-09 NOTE — Telephone Encounter (Signed)
Mallissa called for a refill of the Epi-pen. Historical provider.

## 2017-04-09 NOTE — Telephone Encounter (Signed)
Ok to send to pharmacy

## 2017-04-10 MED ORDER — EPINEPHRINE 0.3 MG/0.3ML IJ SOAJ: INTRAMUSCULAR | 99 refills | Status: DC

## 2017-04-10 NOTE — Telephone Encounter (Signed)
Patient advised and refill sent.  

## 2017-04-13 ENCOUNTER — Other Ambulatory Visit: Payer: Self-pay | Admitting: Physician Assistant

## 2017-04-13 MED ORDER — ALPRAZOLAM 0.5 MG PO TABS: ORAL_TABLET | ORAL | 0 refills | Status: DC

## 2017-04-13 NOTE — Telephone Encounter (Signed)
Routed to PCP for review, based on Rx directions/need.

## 2017-04-13 NOTE — Telephone Encounter (Signed)
Pt advised.

## 2017-04-26 ENCOUNTER — Other Ambulatory Visit: Payer: Self-pay | Admitting: *Deleted

## 2017-04-26 MED ORDER — EPINEPHRINE 0.3 MG/0.3ML IJ SOAJ: INTRAMUSCULAR | 99 refills | Status: DC

## 2017-05-03 ENCOUNTER — Encounter: Payer: Self-pay | Admitting: Sports Medicine

## 2017-05-03 ENCOUNTER — Telehealth: Payer: Self-pay | Admitting: Sports Medicine

## 2017-05-03 ENCOUNTER — Ambulatory Visit (INDEPENDENT_AMBULATORY_CARE_PROVIDER_SITE_OTHER): Payer: 59 | Admitting: Sports Medicine

## 2017-05-03 DIAGNOSIS — M654 Radial styloid tenosynovitis [de Quervain]: Secondary | ICD-10-CM | POA: Diagnosis not present

## 2017-05-03 NOTE — Assessment & Plan Note (Addendum)
Present for 3 weeks now, injection per patient request. Thumb spica brace at work, she can remove it to write. Home rehabilitation exercises.  Return to see me in one month.

## 2017-05-03 NOTE — Telephone Encounter (Signed)
Faxed letter and left VM to notify Pt.

## 2017-05-03 NOTE — Telephone Encounter (Signed)
Pt called clinic to request work note that states she does not have any restrictions other than wearing her brace. This should be faxed to: F: [phone]. Routing for review.

## 2017-05-03 NOTE — Progress Notes (Signed)
   Subjective:    I'm seeing this patient as a consultation for:  Tandy GawJade Breeback PA-C  CC: right thumb pain  HPI:  Ms. Joanne Winters is a 51 y.o. Female with a history of neuropathy and lateral epicondylitis who presents with right thumb pain. She first presented with thumb pain last October. X-rays could not exclude fracture of the distal phalanx, so she was provided with a wrist brace to immobilize the thumb which she used for 2 weeks or so. She didn't like the brace because it made it hard for her to write. A couple weeks ago, she jammed her right 2nd digit, which brought the thumb pain back in full force. The pain is located at the base of the thumb and the thenar eminence. The pain is worse after lifting heavy boxes at work. She describes the pain as constantly 5/10 and up to 7 to 8/10 when she is picking things up. The pain has been this severe for 3 weeks or so.   Past medical history:  Negative.  See flowsheet/record as well for more information.  Surgical history: Negative.  See flowsheet/record as well for more information.  Family history: Negative.  See flowsheet/record as well for more information.  Social history: Negative.  See flowsheet/record as well for more information.  Allergies, and medications have been entered into the medical record, reviewed, and no changes needed.   Review of Systems: No headache, visual changes, nausea, vomiting, diarrhea, constipation, dizziness, abdominal pain, skin rash, fevers, chills, night sweats, weight loss, swollen lymph nodes, body aches, joint swelling, muscle aches, chest pain, shortness of breath, mood changes, visual or auditory hallucinations.   Objective:   General: Well Developed, well nourished, and in no acute distress.  Neuro/Psych: Alert and oriented x3, extra-ocular muscles intact, able to move all 4 extremities, sensation grossly intact. Skin: Warm and dry, no rashes noted.  Respiratory: Not using accessory muscles, speaking in full  sentences, trachea midline.  Cardiovascular: Pulses palpable, no extremity edema. Abdomen: Does not appear distended. Right thumb: Right posterior thumb base swollen compared to left, no erythema. Thumb adduction, extension normal against resistance; flexion normal ROM but painful against resistance; opposition painful ROM TTP at the base of the thumb to the styloid process of the radius corresponding to R.R. DonnelleyDe Quervain's tendons. Positive Finkelstein. Right second digit full strength, ROM, no TTP.    Impression and Recommendations:   This case required medical decision making of moderate complexity.  Ms. Joanne Winters is a 51 y.o. Female with a history of neuropathy and lateral epicondylitis who presents with right De Quervain's tenosynovitis.  Steroid injection placed. Pt has thumb spica brace at home that she will use at work. She can remove it to write. Pt provided with home rehabilitation exercises. Follow-up in 1 month.

## 2017-05-03 NOTE — Telephone Encounter (Signed)
Letter given to you in hand.

## 2017-05-10 LAB — HM MAMMOGRAPHY

## 2017-06-08 ENCOUNTER — Encounter: Payer: Self-pay | Admitting: Physician Assistant

## 2017-07-02 ENCOUNTER — Other Ambulatory Visit: Payer: Self-pay | Admitting: Physician Assistant

## 2017-07-11 ENCOUNTER — Encounter: Payer: Self-pay | Admitting: Family Medicine

## 2017-07-11 ENCOUNTER — Ambulatory Visit (INDEPENDENT_AMBULATORY_CARE_PROVIDER_SITE_OTHER): Payer: 59

## 2017-07-11 ENCOUNTER — Ambulatory Visit (INDEPENDENT_AMBULATORY_CARE_PROVIDER_SITE_OTHER): Payer: 59 | Admitting: Family Medicine

## 2017-07-11 VITALS — BP 137/77 | HR 75 | Wt 222.0 lb

## 2017-07-11 DIAGNOSIS — M25562 Pain in left knee: Secondary | ICD-10-CM

## 2017-07-11 DIAGNOSIS — M1712 Unilateral primary osteoarthritis, left knee: Secondary | ICD-10-CM | POA: Diagnosis not present

## 2017-07-11 DIAGNOSIS — R635 Abnormal weight gain: Secondary | ICD-10-CM

## 2017-07-11 DIAGNOSIS — M810 Age-related osteoporosis without current pathological fracture: Secondary | ICD-10-CM | POA: Diagnosis not present

## 2017-07-11 MED ORDER — EPINEPHRINE 0.3 MG/0.3ML IJ SOAJ: 0.3000 mg | Freq: Once | INTRAMUSCULAR | 99 refills | Status: AC

## 2017-07-11 MED ORDER — ALBUTEROL SULFATE HFA 108 (90 BASE) MCG/ACT IN AERS: 1.0000 | INHALATION_SPRAY | Freq: Four times a day (QID) | RESPIRATORY_TRACT | 2 refills | Status: DC | PRN

## 2017-07-11 MED ORDER — PHENTERMINE HCL 37.5 MG PO TABS: ORAL_TABLET | ORAL | 0 refills | Status: DC

## 2017-07-11 MED ORDER — DICLOFENAC SODIUM 1 % TD GEL: 4.0000 g | Freq: Four times a day (QID) | TRANSDERMAL | 11 refills | Status: DC

## 2017-07-11 NOTE — Patient Instructions (Signed)
Thank you for coming in today. Apply the voltaren Gel to the knee 4x daily.  Recheck in 1 month.  Use phentermine for weight loss.  Make sure to log food.  Use [employer] or myfitness pal as an app.  Make sure to use a food scale.

## 2017-07-11 NOTE — Progress Notes (Signed)
Domingo Dimesearlie Deuser is a 51 y.o. female who [facility] today for left knee pain. Patient notes a two-week history of left knee pain. She notes the pain is located at the posterior aspect of the knee and is worse with activity. She notes stiffness but denies significant locking or catching. She denies any radiating pain weakness or numbness. She has not tried any medications yet. She denies fevers chills nausea vomiting or diarrhea.  Additionally she has difficulty losing weight. In the past she's done well with phentermine. She does not currently longer food. She would like a refill of phentermine if possible.   Past Medical History:  Diagnosis Date  . GERD (gastroesophageal reflux disease)   . Seasonal allergies    Past Surgical History:  Procedure Laterality Date  . ABLATION  2013  . CESAREAN SECTION    . gall stone  1995   Social History  Substance Use Topics  . Smoking status: Never Smoker  . Smokeless tobacco: Never Used  . Alcohol use No     ROS:  As above   Medications: Current Outpatient Prescriptions  Medication Sig Dispense Refill  . albuterol (PROAIR HFA) 108 (90 Base) MCG/ACT inhaler Inhale 1-2 puffs into the lungs every 6 (six) hours as needed for wheezing or shortness of breath. 1 Inhaler 2  . ALPRAZolam (XANAX) 0.5 MG tablet Take 1-2 tablets as needed for flying and travel. 20 tablet 0  . hydrochlorothiazide (HYDRODIURIL) 25 MG tablet ONE TABLET BY MOUTH EVERY MORNING FOR BLOOD PRESSURE CONTROL. 90 tablet 4  . montelukast (SINGULAIR) 10 MG tablet Take 1 tablet (10 mg total) by mouth at bedtime. 90 tablet 4  . pantoprazole (PROTONIX) 40 MG tablet Take 1 tablet (40 mg total) by mouth daily. 90 tablet 4  . TRANSDERM-SCOP, 1.5 MG, 1 MG/3DAYS PLACE 1 PATCH (1.5 MG TOTAL) ONTO THE SKIN EVERY 3 (THREE) DAYS. 4 patch 0  . diclofenac sodium (VOLTAREN) 1 % GEL Apply 4 g topically 4 (four) times daily. To affected joint.  100 g 11  . EPINEPHrine 0.3 mg/0.3 mL IJ SOAJ injection Inject 0.3 mLs (0.3 mg total) into the muscle once. 2 Device prn  . phentermine (ADIPEX-P) 37.5 MG tablet One tab by mouth qAM 30 tablet 0   No current facility-administered medications for this visit.    Allergies  Allergen Reactions  . Hydrocodone Other (See Comments)     Exam:  BP 137/77   Pulse 75   Wt 222 lb (100.7 kg)   SpO2 99%   BMI 36.94 kg/m   Wt Readings from Last 10 Encounters:  07/11/17 222 lb (100.7 kg)  05/03/17 222 lb (100.7 kg)  02/27/17 227 lb (103 kg)  12/14/16 232 lb (105.2 kg)  11/20/16 230 lb (104.3 kg)  11/06/16 231 lb (104.8 kg)  10/17/16 224 lb (101.6 kg)  07/14/16 225 lb (102.1 kg)  01/10/16 225 lb 12.8 oz (102.4 kg)  12/15/15 219 lb (99.3 kg)    General: Well Developed, well nourished, and in no acute distress.  Neuro/Psych: Alert and oriented x3, extra-ocular muscles intact, able to move all 4 extremities, sensation grossly intact. Skin: Warm and dry, no rashes noted.  Respiratory: Not using accessory muscles, speaking in full sentences, trachea midline.  Cardiovascular: Pulses palpable, no extremity edema. Abdomen: Does not appear distended. MSK: Left knee normal-appearing with minimal effusion no erythema. Range of motion 0-120 with retro-patellar crepitations. Tender palpation medial joint line. Stable ligamentous exam. Positive  medial McMurray's test. 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 Awaiting formal radiology review      Assessment and Plan: 51 y.o. female with left knee pain likely exacerbation of mild DJD versus medial meniscus injury. Discussed options. Plan for trial of topical diclofenac gel. If not better will proceed with injection in the future. Recheck in one month.  Abnormal weight gain: Discussed weight loss methods. Plan for a trial of phentermine along with food log and measuring food. Recheck in one  month.    Orders Placed This Encounter  Procedures  . DG Knee 1-2 Views Right    Standing Status:   Future    Number of Occurrences:   1    Standing Expiration Date:   09/11/2018    Order Specific Question:   Reason for Exam (SYMPTOM  OR DIAGNOSIS REQUIRED)    Answer:   For use with the left knee x-ray bilateral AP and Rosenberg standing.    Order Specific Question:   Is the patient pregnant?    Answer:   No    Order Specific Question:   Preferred imaging location?    [facility]  . DG Knee Complete 4 Views Left    Please include patellar sunrise, lateral, and weightbearing bilateral AP and bilateral rosenberg views    Standing Status:   Future    Number of Occurrences:   1    Standing Expiration Date:   09/10/2018    Order Specific Question:   Reason for exam:    Answer:   Please include patellar sunrise, lateral, and weightbearing bilateral AP and bilateral rosenberg views    Comments:   Please include patellar sunrise, lateral, and weightbearing bilateral AP and bilateral rosenberg views    Order Specific Question:   Preferred imaging location?    Answer[facility]   Meds ordered this encounter  Medications  . EPINEPHrine 0.3 mg/0.3 mL IJ SOAJ injection    Sig: Inject 0.3 mLs (0.3 mg total) into the muscle once.    Dispense:  2 Device    Refill:  prn  . albuterol (PROAIR HFA) 108 (90 Base) MCG/ACT inhaler    Sig: Inhale 1-2 puffs into the lungs every 6 (six) hours as needed for wheezing or shortness of breath.    Dispense:  1 Inhaler    Refill:  2  . diclofenac sodium (VOLTAREN) 1 % GEL    Sig: Apply 4 g topically 4 (four) times daily. To affected joint.    Dispense:  100 g    Refill:  11  . phentermine (ADIPEX-P) 37.5 MG tablet    Sig: One tab by mouth qAM    Dispense:  30 tablet    Refill:  0    Discussed warning signs or symptoms. Please see discharge instructions. Patient expresses understanding.

## 2017-07-27 ENCOUNTER — Other Ambulatory Visit: Payer: Self-pay | Admitting: Physician Assistant

## 2017-07-27 DIAGNOSIS — G629 Polyneuropathy, unspecified: Secondary | ICD-10-CM

## 2017-08-08 ENCOUNTER — Ambulatory Visit (INDEPENDENT_AMBULATORY_CARE_PROVIDER_SITE_OTHER): Payer: 59 | Admitting: Family Medicine

## 2017-08-08 ENCOUNTER — Encounter: Payer: Self-pay | Admitting: Family Medicine

## 2017-08-08 VITALS — BP 117/79 | HR 73 | Wt 219.0 lb

## 2017-08-08 DIAGNOSIS — M25562 Pain in left knee: Secondary | ICD-10-CM

## 2017-08-08 NOTE — Patient Instructions (Signed)
Thank you for coming in today. Let me know when you want to recheck the knee.    As for phentermine you will likely run out in about 2 weeks.  You should have a nurse visit for phentermine check then with Jade's team.   Next step for knee is either physical therapy, or injection or MRI.    Meniscus Tear A meniscus tear is a knee injury in which a piece of the meniscus is torn. The meniscus is a thick, rubbery, wedge-shaped cartilage in the knee. Two menisci are located in each knee. They sit between the upper bone (femur) and lower bone (tibia) that make up the knee joint. Each meniscus acts as a shock absorber for the knee. A torn meniscus is one of the most common types of knee injuries. This injury can range from mild to severe. Surgery may be needed for a severe tear. What are the causes? This injury may be caused by any squatting, twisting, or pivoting movement. Sports-related injuries are the most common cause. These often occur from:  Running and stopping suddenly.  Changing direction.  Being tackled or knocked off your feet.  As people get older, their meniscus gets thinner and weaker. In these people, tears can happen more easily, such as from climbing stairs. What increases the risk? This injury is more likely to happen to:  People who play contact sports.  Males.  People who are 39?52 years of age.  What are the signs or symptoms? Symptoms of this injury include:  Knee pain, especially at the side of the knee joint. You may feel pain when the injury occurs, or you may only hear a pop and feel pain later.  A feeling that your knee is clicking, catching, locking, or giving way.  Not being able to fully bend or extend your knee.  Bruising or swelling in your knee.  How is this diagnosed? This injury may be diagnosed based on your symptoms and a physical exam. The physical exam may include:  Moving your knee in different ways.  Feeling for  tenderness.  Listening for a clicking sound.  Checking if your knee locks or catches.  You may also have tests, such as:  X-rays.  MRI.  A procedure to look inside your knee with a narrow surgical telescope (arthroscopy).  You may be referred to a knee specialist (orthopedic surgeon). How is this treated? Treatment for this injury depends on the severity of the tear. Treatment for a mild tear may include:  Rest.  Medicine to reduce pain and swelling. This is usually a nonsteroidal anti-inflammatory drug (NSAID).  A knee brace or an elastic sleeve or wrap.  Using crutches or a walker to keep weight off your knee and to help you walk.  Exercises to strengthen your knee (physical therapy).  You may need surgery if you have a severe tear or if other treatments are not working. Follow these instructions at home: Managing pain and swelling  Take over-the-counter and prescription medicines only as told by your health care provider.  If directed, apply ice to the injured area: ? Put ice in a plastic bag. ? Place a towel between your skin and the bag. ? Leave the ice on for 20 minutes, 2-3 times per day.  Raise (elevate) the injured area above the level of your heart while you are sitting or lying down. Activity  Do not use the injured limb to support your body weight until your health care provider says that  you can. Use crutches or a walker as told by your health care provider.  Return to your normal activities as told by your health care provider. Ask your health care provider what activities are safe for you.  Perform range-of-motion exercises only as told by your health care provider.  Begin doing exercises to strengthen your knee and leg muscles only as told by your health care provider. After you recover, your health care provider may recommend these exercises to help prevent another injury. General instructions  Use a knee brace or elastic wrap as told by your health  care provider.  Keep all follow-up visits as told by your health care provider. This is important. Contact a health care provider if:  You have a fever.  Your knee becomes red, tender, or swollen.  Your pain medicine is not helping.  Your symptoms get worse or do not improve after 2 weeks of home care. This information is not intended to replace advice given to you by your health care provider. Make sure you discuss any questions you have with your health care provider. Document Released: 02/24/2003 Document Revised: 05/11/2016 Document Reviewed: 03/29/2015 Elsevier Interactive Patient Education  [employer].

## 2017-08-08 NOTE — Progress Notes (Signed)
Joanne Winters is a 51 y.o. female who presents to [facility] today for follow-up left knee pain. Patient was seen about a month ago for left posterior knee pain. In the interim she's used diclofenac gel which has helped only a little. She notes continued pain and stiffness worse with activity. She denies any radiating pain clicking or catching. She denies fevers or chills.   Past Medical History:  Diagnosis Date  . GERD (gastroesophageal reflux disease)   . Seasonal allergies    Past Surgical History:  Procedure Laterality Date  . ABLATION  2013  . CESAREAN SECTION    . gall stone  1995   Social History  Substance Use Topics  . Smoking status: Never Smoker  . Smokeless tobacco: Never Used  . Alcohol use No   family history includes Cancer in her maternal grandmother and paternal grandmother; Cancer - Colon in her father; Hypertension in her daughter, father, maternal grandmother, mother, and paternal grandmother.  ROS as above:  Medications: Current Outpatient Prescriptions  Medication Sig Dispense Refill  . albuterol (PROAIR HFA) 108 (90 Base) MCG/ACT inhaler Inhale 1-2 puffs into the lungs every 6 (six) hours as needed for wheezing or shortness of breath. 1 Inhaler 2  . ALPRAZolam (XANAX) 0.5 MG tablet Take 1-2 tablets as needed for flying and travel. 20 tablet 0  . diclofenac sodium (VOLTAREN) 1 % GEL Apply 4 g topically 4 (four) times daily. To affected joint. 100 g 11  . gabapentin (NEURONTIN) 100 MG capsule TAKE 1 CAPSULE (100 MG TOTAL) BY MOUTH 3 (THREE) TIMES DAILY. 90 capsule 3  . hydrochlorothiazide (HYDRODIURIL) 25 MG tablet ONE TABLET BY MOUTH EVERY MORNING FOR BLOOD PRESSURE CONTROL. 90 tablet 4  . montelukast (SINGULAIR) 10 MG tablet Take 1 tablet (10 mg total) by mouth at bedtime. 90 tablet 4  . pantoprazole (PROTONIX) 40 MG tablet Take 1 tablet  (40 mg total) by mouth daily. 90 tablet 4  . phentermine (ADIPEX-P) 37.5 MG tablet One tab by mouth qAM 30 tablet 0  . TRANSDERM-SCOP, 1.5 MG, 1 MG/3DAYS PLACE 1 PATCH (1.5 MG TOTAL) ONTO THE SKIN EVERY 3 (THREE) DAYS. 4 patch 0   No current facility-administered medications for this visit.    Allergies  Allergen Reactions  . Hydrocodone Other (See Comments)    Health Maintenance Health Maintenance  Topic Date Due  . COLONOSCOPY  08/21/2015  . INFLUENZA VACCINE  07/18/2017  . HIV Screening  08/08/2018 (Originally 08[birth year]20/1980)  . TETANUS/TDAP  12/18/2017  . PAP SMEAR  12/22/2017  . MAMMOGRAM  05/10/2018     Exam:  BP 117/79   Pulse 73   Wt 219 lb (99.3 kg)   BMI 36.44 kg/m   Wt Readings from Last 10 Encounters:  08/08/17 219 lb (99.3 kg)  07/11/17 222 lb (100.7 kg)  05/03/17 222 lb (100.7 kg)  02/27/17 227 lb (103 kg)  12/14/16 232 lb (105.2 kg)  11/20/16 230 lb (104.3 kg)  11/06/16 231 lb (104.8 kg)  10/17/16 224 lb (101.6 kg)  07/14/16 225 lb (102.1 kg)  01/10/16 225 lb 12.8 oz (102.4 kg)    Gen: Well NAD HEENT: EOMI,  MMM Lungs: Normal work of breathing. CTABL Heart: RRR no MRG Abd: NABS, Soft. Nondistended, Nontender Exts: Brisk capillary refill, warm and well perfused.  Left knee: No effusion or erythema. Nontender normal motion.   No results found for this or any previous visit (from  the past 72 hour(s)). No results found.    Assessment and Plan: 51 y.o. female with continued left knee pain. We had a lengthy discussion about options. Patient declines MRI or injection today. We'll plan for referral to physical therapy. If not improving will return to clinic and will use her continue with MRI versus injection.  As for obesity. I recommend patient follow-up with her PCP for further phentermine refills.   Orders Placed This Encounter  Procedures  . Ambulatory referral to Physical Therapy    Referral Priority:   Routine    Referral Type:   Physical  Medicine    Referral Reason:   Specialty Services Required    Requested Specialty:   Physical Therapy   No orders of the defined types were placed in this encounter.    Discussed warning signs or symptoms. Please see discharge instructions. Patient expresses understanding.  I spent 25 minutes with this patient, greater than 50% was face-to-face time counseling regarding likely diagnosis next steps risks versus benefits of injection versus MRI versus physical therapy.[name] Kitchen

## 2017-09-11 ENCOUNTER — Encounter: Payer: Self-pay | Admitting: Physician Assistant

## 2017-09-11 ENCOUNTER — Ambulatory Visit (INDEPENDENT_AMBULATORY_CARE_PROVIDER_SITE_OTHER): Payer: 59 | Admitting: Physician Assistant

## 2017-09-11 VITALS — BP 130/90 | HR 78 | Ht 65.0 in | Wt 221.0 lb

## 2017-09-11 DIAGNOSIS — R0989 Other specified symptoms and signs involving the circulatory and respiratory systems: Secondary | ICD-10-CM | POA: Insufficient documentation

## 2017-09-11 DIAGNOSIS — M25562 Pain in left knee: Secondary | ICD-10-CM | POA: Diagnosis not present

## 2017-09-11 DIAGNOSIS — I10 Essential (primary) hypertension: Secondary | ICD-10-CM | POA: Diagnosis not present

## 2017-09-11 DIAGNOSIS — M722 Plantar fascial fibromatosis: Secondary | ICD-10-CM | POA: Diagnosis not present

## 2017-09-11 DIAGNOSIS — R6889 Other general symptoms and signs: Secondary | ICD-10-CM

## 2017-09-11 DIAGNOSIS — R499 Unspecified voice and resonance disorder: Secondary | ICD-10-CM | POA: Insufficient documentation

## 2017-09-11 DIAGNOSIS — M7731 Calcaneal spur, right foot: Secondary | ICD-10-CM | POA: Diagnosis not present

## 2017-09-11 MED ORDER — MELOXICAM 15 MG PO TABS: 15.0000 mg | ORAL_TABLET | Freq: Every day | ORAL | 2 refills | Status: DC

## 2017-09-11 NOTE — Addendum Note (Signed)
Addended by: Jomarie Longs on: 09/11/2017 05:26 PM   Modules accepted: Orders, Level of Service

## 2017-09-11 NOTE — Progress Notes (Addendum)
Subjective:    Patient ID: Joanne Winters, female    DOB: August 29, 1966, 51 y.o.   MRN: [id number]  HPI The patient is a 51 yo female who presents to the clinic today for left knee pain and bilateral foot pain. She reports posterior left knee pain that is worst after work. She denies popping, clicking, or swelling. She works at [employer] and stands 10-11 hours per day. She saw Dr. Denyse Amass 08/08/17 for her left knee pain and he recommended physical therapy, but she did not go because she was going on a cruise to [place]. She planned to go to PT after her cruise, but she forgot.  She also complains of right heel pain and left plantar foot pain. She states that she wears steel toe boots at work and believes that it is the cause of her foot pain. She got new boots 6 months ago and gel insoles for her boots 1 month ago. She reports morning foot pain that is alleviated after walking. She often wears sandals while at home. She states that she has not tried any antiinflammatories.   She also mentioned needing allergy testing for work. She reported that at work she has had several episodes of hoarseness and throat tightness, especially when in a confined area. She states that her job wants her allergy tested to determine whether or not she is allergic to a chemical they use at the plant. She takes liquid Benadryl when this reaction happens. She also has an Epipen.   She states that she has not been taking her diuretic as prescribed for hypertension. She is aware that she should be taking it daily, however she has only been take it occasionally. She denies any CP, palpitations, headaches, vision changes.   .. Active Ambulatory Problems    Diagnosis Date Noted  . GERD (gastroesophageal reflux disease) 10/07/2014  . Seasonal allergies 10/07/2014  . Environmental allergies 10/07/2014  . Obesity 10/07/2014  . Lateral epicondylitis of right elbow 12/22/2014  . Thyroid goiter 12/22/2014  . Obese 12/22/2014  .  Abnormal weight gain 03/01/2015  . Right calf pain 04/26/2015  . Elevated blood pressure 04/26/2015  . Plantar wart 05/12/2015  . Pain in lower limb 05/26/2015  . Essential hypertension 12/06/2015  . PUD (peptic ulcer disease) 12/15/2015  . 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 Motion sickness 07/16/2016  . Skin tag 07/16/2016  . Neuropathy 10/20/2016  . De Quervain's tenosynovitis, right 10/20/2016  . 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 Multiple thyroid nodules 02/28/2017  . Left knee pain 07/11/2017   Resolved Ambulatory Problems    Diagnosis Date Noted  . No Resolved Ambulatory Problems   Past Medical History:  Diagnosis Date  . GERD (gastroesophageal reflux disease)   . Seasonal allergies           Review of Systems  All other systems reviewed and are negative.      Objective:   Physical Exam  Constitutional: She appears well-developed and well-nourished.  Musculoskeletal:       Left knee: She exhibits no swelling. No tenderness found. No medial joint line, no lateral joint line, no MCL, no LCL and no patellar tendon tenderness noted.       Right foot: There is tenderness.       Left foot: There is tenderness.       Feet:  Tenderness to palpation of the right calcaneus and left plantar fascia. Full ROM and strength with dorsiflexion and plantarflexion of bilateral feet.  Assessment & Plan:  [name] KitchenMarland KitchenPearlie was seen today for knee pain.  Diagnoses and all orders for this visit:  Plantar fasciitis of left foot  Heel spur, right  Acute pain of left knee  Essential hypertension  Hoarseness or changing voice -     Ambulatory referral to Allergy  Throat tightness -     Ambulatory referral to Allergy  Other orders -     Discontinue: meloxicam (MOBIC) 15 MG tablet; Take 1 tablet (15 mg total) by mouth daily.    I suspect a heel spur of the right calcaneus, plantar fascitis of the left foot, and left knee pain. She was given a prescription for Meloxicam during acute  flares of knee and foot pain. It was discussed that she should limit the Meloxicam to 2-3 days because of her history of peptic ulcer disease. She understands and agreed to limit her Meloxicam. She was given a handout on heel spurs and plantar fascitis and at home exercises she can do.  She will plan to do physical therapy for her left knee pain.  She could also consider coming in to have sports medicine make her some orthotics.

## 2017-09-11 NOTE — Patient Instructions (Signed)
Heel Spur A heel spur is a bony growth that forms on the bottom of your heel bone (calcaneus). Heel spurs are common and do not always cause pain. However, heel spurs often cause inflammation in the strong band of tissue that runs underneath the bone of your foot (plantar fascia). When this happens, you may feel pain on the bottom of your foot, near your heel. What are the causes? The cause of heel spurs is not completely understood. They may be caused by pressure on the heel. Or, they may stem from the muscle attachments (tendons) near the spur pulling on the heel. What increases the risk? You may be at risk for a heel spur if you:  Are older than 40.  Are overweight.  Have wear and tear arthritis (osteoarthritis).  Have plantar fascia inflammation.  What are the signs or symptoms? Some people have heel spurs but no symptoms. If you do have symptoms, they may include:  Pain in the bottom of your heel.  Pain that is worse when you first get out of bed.  Pain that gets worse after walking or standing.  How is this diagnosed? Your health care provider may diagnose a heel spur based on your symptoms and a physical exam. You may also have an X-ray of your foot to check for a bony growth coming from the calcaneus. How is this treated? Treatment aims to relieve the pain from the heel spur. This may include:  Stretching exercises.  Losing weight.  Wearing specific shoes, inserts, or orthotics for comfort and support.  Wearing splints at night to properly position your feet.  Taking over-the-counter medicine to relieve pain.  Being treated with high-intensity sound waves to break up the heel spur (extracorporeal shock wave therapy).  Getting steroid injections in your heel to reduce swelling and ease pain.  Having surgery if your heel spur causes long-term (chronic) pain.  Follow these instructions at home:  Take medicines only as directed by your health care provider.  Ask  your health care provider if you should use ice or cold packs on the painful areas of your heel or foot.  Avoid activities that cause you pain until you recover or as directed by your health care provider.  Stretch before exercising or being physically active.  Wear supportive shoes that fit well as directed by your health care provider. You might need to buy new shoes. Wearing old shoes or shoes that do not fit correctly may not provide the support that you need.  Lose weight if your health care provider thinks you should. This can relieve pressure on your foot that may be causing pain and discomfort. Contact a health care provider if:  Your pain continues or gets worse. This information is not intended to replace advice given to you by your health care provider. Make sure you discuss any questions you have with your health care provider. Document Released: 01/10/2006 Document Revised: 05/11/2016 Document Reviewed: 02/04/2014 Elsevier Interactive Patient Education  2018 [employer] Plantar Fasciitis Rehab Ask your health care provider which exercises are safe for you. Do exercises exactly as told by your health care provider and adjust them as directed. It is normal to feel mild stretching, pulling, tightness, or discomfort as you do these exercises, but you should stop right away if you feel sudden pain or your pain gets worse. Do not begin these exercises until told by your health care provider. Stretching and range of motion exercises These exercises warm up your muscles  and joints and improve the movement and flexibility of your foot. These exercises also help to relieve pain. Exercise A: Plantar fascia stretch  1. Sit with your left / right leg crossed over your opposite knee. 2. Hold your heel with one hand with that thumb near your arch. With your other hand, hold your toes and gently pull them back toward the top of your foot. You should feel a stretch on the bottom of your toes or  your foot or both. 3. Hold this stretch for__________ seconds. 4. Slowly release your toes and return to the starting position. Repeat __________ times. Complete this exercise __________ times a day. Exercise B: Gastroc, standing  1. Stand with your hands against a wall. 2. Extend your left / right leg behind you, and bend your front knee slightly. 3. Keeping your heels on the floor and keeping your back knee straight, shift your weight toward the wall without arching your back. You should feel a gentle stretch in your left / right calf. 4. Hold this position for __________ seconds. Repeat __________ times. Complete this exercise __________ times a day. Exercise C: Soleus, standing 1. Stand with your hands against a wall. 2. Extend your left / right leg behind you, and bend your front knee slightly. 3. Keeping your heels on the floor, bend your back knee and slightly shift your weight over the back leg. You should feel a gentle stretch deep in your calf. 4. Hold this position for __________ seconds. Repeat __________ times. Complete this exercise __________ times a day. Exercise D: Gastrocsoleus, standing 1. Stand with the ball of your left / right foot on a step. The ball of your foot is on the walking surface, right under your toes. 2. Keep your other foot firmly on the same step. 3. Hold onto the wall or a railing for balance. 4. Slowly lift your other foot, allowing your body weight to press your heel down over the edge of the step. You should feel a stretch in your left / right calf. 5. Hold this position for __________ seconds. 6. Return both feet to the step. 7. Repeat this exercise with a slight bend in your left / right knee. Repeat __________ times with your left / right knee straight and __________ times with your left / right knee bent. Complete this exercise __________ times a day. Balance exercise This exercise builds your balance and strength control of your arch to help  take pressure off your plantar fascia. Exercise E: Single leg stand 1. Without shoes, stand near a railing or in a doorway. You may hold onto the railing or door frame as needed. 2. Stand on your left / right foot. Keep your big toe down on the floor and try to keep your arch lifted. Do not let your foot roll inward. 3. Hold this position for __________ seconds. 4. If this exercise is too easy, you can try it with your eyes closed or while standing on a pillow. Repeat __________ times. Complete this exercise __________ times a day. This information is not intended to replace advice given to you by your health care provider. Make sure you discuss any questions you have with your health care provider. Document Released: 12/04/2005 Document Revised: 08/08/2016 Document Reviewed: 10/18/2015 Elsevier Interactive Patient Education  2018 [employer].

## 2017-10-09 ENCOUNTER — Other Ambulatory Visit: Payer: Self-pay | Admitting: Physician Assistant

## 2017-10-12 ENCOUNTER — Other Ambulatory Visit: Payer: Self-pay | Admitting: Physician Assistant

## 2017-10-17 NOTE — Telephone Encounter (Signed)
Do you want patient to continue medication? Please advise.

## 2017-12-27 ENCOUNTER — Other Ambulatory Visit: Payer: Self-pay | Admitting: Physician Assistant

## 2018-02-13 ENCOUNTER — Encounter: Payer: Self-pay | Admitting: Physician Assistant

## 2018-02-13 DIAGNOSIS — R059 Cough, unspecified: Secondary | ICD-10-CM | POA: Insufficient documentation

## 2018-02-13 DIAGNOSIS — R05 Cough: Secondary | ICD-10-CM | POA: Insufficient documentation

## 2018-02-27 ENCOUNTER — Encounter: Payer: Self-pay | Admitting: Physician Assistant

## 2018-02-27 ENCOUNTER — Ambulatory Visit: Payer: 59 | Admitting: Physician Assistant

## 2018-02-27 VITALS — BP 140/80 | HR 78 | Ht 65.0 in | Wt 222.0 lb

## 2018-02-27 DIAGNOSIS — K2100 Gastro-esophageal reflux disease with esophagitis, without bleeding: Secondary | ICD-10-CM

## 2018-02-27 DIAGNOSIS — M62830 Muscle spasm of back: Secondary | ICD-10-CM

## 2018-02-27 DIAGNOSIS — F40243 Fear of flying: Secondary | ICD-10-CM

## 2018-02-27 DIAGNOSIS — Z6836 Body mass index (BMI) 36.0-36.9, adult: Secondary | ICD-10-CM | POA: Diagnosis not present

## 2018-02-27 DIAGNOSIS — I10 Essential (primary) hypertension: Secondary | ICD-10-CM | POA: Diagnosis not present

## 2018-02-27 MED ORDER — LIRAGLUTIDE -WEIGHT MANAGEMENT 18 MG/3ML ~~LOC~~ SOPN: 0.6000 mg | PEN_INJECTOR | Freq: Every day | SUBCUTANEOUS | 1 refills | Status: DC

## 2018-02-27 MED ORDER — CYCLOBENZAPRINE HCL 10 MG PO TABS: 10.0000 mg | ORAL_TABLET | Freq: Three times a day (TID) | ORAL | 0 refills | Status: DC | PRN

## 2018-02-27 MED ORDER — HYDROCHLOROTHIAZIDE 25 MG PO TABS: ORAL_TABLET | ORAL | 1 refills | Status: DC

## 2018-02-27 MED ORDER — PANTOPRAZOLE SODIUM 40 MG PO TBEC: 40.0000 mg | DELAYED_RELEASE_TABLET | Freq: Every day | ORAL | 3 refills | Status: DC

## 2018-02-27 MED ORDER — ALPRAZOLAM 0.5 MG PO TABS: ORAL_TABLET | ORAL | 0 refills | Status: DC

## 2018-02-27 NOTE — Progress Notes (Signed)
Subjective:    Patient ID: Joanne Winters, female    DOB: September 07, 1966, 52 y.o.   MRN: [id number]  HPI  Pt is a 52 yo obese female with HTN who presents to the clinic to follow up on low to mid back pain after massage.   Pt goes regularly to [facility] and after a deep tissue massage the other day( 2 weeks ago)  her back started to spasms and she was in a lot of pain. [facility] does not want to work on her again until released from doctor. Pt reports she had no pain in last few days. She did take her friends muscle relaxer and helped a lot. Denies any bowel or bladder dysfunction, no saddle anesthesia, no extremity weakness.   HTN- not taking HCTZ. Denies any CP, palpitations, vision changes or headaches. She is not checking her BP.   Pt would like to lose weight. She tried phentermine and raised BP and did not seem to work very well. She admits she is not doing everything she could for weight loss.   She request benzo for flying and traveling.   GERD controlled with medication.  .. Active Ambulatory Problems    Diagnosis Date Noted  . GERD (gastroesophageal reflux disease) 10/07/2014  . Seasonal allergies 10/07/2014  . Environmental allergies 10/07/2014  . Obesity 10/07/2014  . Lateral epicondylitis of right elbow 12/22/2014  . Thyroid goiter 12/22/2014  . Class 2 severe obesity due to excess calories with serious comorbidity and body mass index (BMI) of 36.0 to 36.9 in adult [facility]) 12/22/2014  . Abnormal weight gain 03/01/2015  . Right calf pain 04/26/2015  . Elevated blood pressure 04/26/2015  . Plantar wart 05/12/2015  . Pain in lower limb 05/26/2015  . Essential hypertension 12/06/2015  . PUD (peptic ulcer disease) 12/15/2015  . 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 Motion sickness 07/16/2016  . Skin tag 07/16/2016  . Neuropathy 10/20/2016  . De Quervain's tenosynovitis, right 10/20/2016  . 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 Multiple thyroid nodules 02/28/2017  . Left knee pain  07/11/2017  . Plantar fasciitis of left foot 09/11/2017  . Heel spur, right 09/11/2017  . Hoarseness or changing voice 09/11/2017  . Throat tightness 09/11/2017  . Cough 02/13/2018  . Spasm of muscle, back 02/28/2018   Resolved Ambulatory Problems    Diagnosis Date Noted  . No Resolved Ambulatory Problems   Past Medical History:  Diagnosis Date  . GERD (gastroesophageal reflux disease)   . Seasonal allergies      Review of Systems  All other systems reviewed and are negative.      Objective:   Physical Exam  Constitutional: She is oriented to person, place, and time. She appears well-developed and well-nourished.  Obese.   HENT:  Head: Normocephalic and atraumatic.  Eyes: Right eye exhibits no discharge.  Cardiovascular: Normal rate, regular rhythm and normal heart sounds.  Pulmonary/Chest: Effort normal and breath sounds normal. She has no wheezes.  Musculoskeletal: Normal range of motion.  NROM at waist.  Negative for any thoracic or lumbar tenderess to palpation.  Upper and lower ext strength 5/5.  Tight paraspinal muscles in lumbar region.   Neurological: She is alert and oriented to person, place, and time. No cranial nerve deficit.  Psychiatric: She has a normal mood and affect. Her behavior is normal.          Assessment & Plan:  [name] KitchenMarland KitchenDiagnoses and all orders for this visit:  Essential hypertension -  hydrochlorothiazide (HYDRODIURIL) 25 MG tablet; TAKE 1 TABLET BY MOUTH  EVERY MORNING FOR BLOOD  PRESSURE CONTROL  Spasm of muscle, back -     cyclobenzaprine (FLEXERIL) 10 MG tablet; Take 1 tablet (10 mg total) by mouth 3 (three) times daily as needed for muscle spasms.  Class 2 severe obesity due to excess calories with serious comorbidity and body mass index (BMI) of 36.0 to 36.9 in adult (HCC) -     Liraglutide -Weight Management (SAXENDA) 18 MG/3ML SOPN; Inject 0.6 mg into the skin daily. For one week then increase by .6mg  weekly until reaches 3mg   daily.  Please include ultra fine needles 6mm  Gastroesophageal reflux disease with esophagitis -     pantoprazole (PROTONIX) 40 MG tablet; Take 1 tablet (40 mg total) by mouth daily.  Fear of flying -     ALPRAZolam (XANAX) 0.5 MG tablet; Take 1-2 tablets as needed for flying and travel.      .. [facility] screen [facility] 02/27/2017  Decreased Interest 0  Down, Depressed, Hopeless 0  PHQ - 2 Score 0   Pt aware to start taking BP medication. Recheck BP in 4 weeks.   Letter written to ok going back to getting massages. I think deep tissue might be too much for patient and had some rebound muscles spasms. Watch presssure. Continue as needed NSAIDs, ice, biofreeze. Flexeril given to use as needed. Sedation warning given.   Xanax refilled for travel.   [name] Kitchen..Discussed low carb diet with 1500 calories and 80g of protein.  Exercising at least 150 minutes a week.  My Fitness Pal could be a [employer] resource.  saxenda given. Side effects discussed. Recheck in 4 weeks. Coupon card given.   [name] Kitchen..Spent 30 minutes with patient and greater than 50 percent of visit spent counseling patient regarding treatment plan.

## 2018-02-28 DIAGNOSIS — M62830 Muscle spasm of back: Secondary | ICD-10-CM | POA: Insufficient documentation

## 2018-03-18 ENCOUNTER — Other Ambulatory Visit: Payer: Self-pay | Admitting: Physician Assistant

## 2018-03-18 DIAGNOSIS — I10 Essential (primary) hypertension: Secondary | ICD-10-CM

## 2018-04-04 ENCOUNTER — Telehealth: Payer: Self-pay | Admitting: Physician Assistant

## 2018-04-04 NOTE — Telephone Encounter (Signed)
-----   Message from Jomarie LongsJade L Breeback, [place] sent at 02/28/2017  4:44 PM EDT ----- Thyroid us follow up

## 2018-04-04 NOTE — Telephone Encounter (Signed)
Reminder to have follow up thyroid u/s on nodule. Can we order?

## 2018-04-09 NOTE — Telephone Encounter (Signed)
Left VM for Pt to return clinic call regarding recommendation.  

## 2018-04-11 NOTE — Telephone Encounter (Signed)
Left message advising of recommendations.  

## 2018-04-17 NOTE — Telephone Encounter (Signed)
Left a message for a call back.

## 2018-04-18 ENCOUNTER — Telehealth: Payer: Self-pay

## 2018-04-18 NOTE — Telephone Encounter (Signed)
Patient called stated that she was returning a call. She is requesting a call back. Please advise. Valynn Schamberger,CMA

## 2018-05-15 LAB — HM MAMMOGRAPHY

## 2018-05-24 ENCOUNTER — Ambulatory Visit (INDEPENDENT_AMBULATORY_CARE_PROVIDER_SITE_OTHER): Payer: 59

## 2018-05-24 ENCOUNTER — Ambulatory Visit (INDEPENDENT_AMBULATORY_CARE_PROVIDER_SITE_OTHER): Payer: 59 | Admitting: Physician Assistant

## 2018-05-24 ENCOUNTER — Encounter: Payer: Self-pay | Admitting: Physician Assistant

## 2018-05-24 VITALS — BP 148/68 | HR 83 | Ht 65.0 in | Wt 222.0 lb

## 2018-05-24 DIAGNOSIS — S76312D Strain of muscle, fascia and tendon of the posterior muscle group at thigh level, left thigh, subsequent encounter: Secondary | ICD-10-CM | POA: Insufficient documentation

## 2018-05-24 DIAGNOSIS — M5137 Other intervertebral disc degeneration, lumbosacral region: Secondary | ICD-10-CM | POA: Diagnosis not present

## 2018-05-24 DIAGNOSIS — M48061 Spinal stenosis, lumbar region without neurogenic claudication: Secondary | ICD-10-CM | POA: Diagnosis not present

## 2018-05-24 DIAGNOSIS — M722 Plantar fascial fibromatosis: Secondary | ICD-10-CM

## 2018-05-24 DIAGNOSIS — G8929 Other chronic pain: Secondary | ICD-10-CM | POA: Diagnosis not present

## 2018-05-24 DIAGNOSIS — M25562 Pain in left knee: Secondary | ICD-10-CM | POA: Diagnosis not present

## 2018-05-24 DIAGNOSIS — Z6836 Body mass index (BMI) 36.0-36.9, adult: Secondary | ICD-10-CM

## 2018-05-24 MED ORDER — LIRAGLUTIDE -WEIGHT MANAGEMENT 18 MG/3ML ~~LOC~~ SOPN: 0.6000 mg | PEN_INJECTOR | Freq: Every day | SUBCUTANEOUS | 1 refills | Status: DC

## 2018-05-24 MED ORDER — DICLOFENAC SODIUM 75 MG PO TBEC: 75.0000 mg | DELAYED_RELEASE_TABLET | Freq: Two times a day (BID) | ORAL | 1 refills | Status: DC

## 2018-05-24 MED ORDER — TRAMADOL HCL 50 MG PO TABS: 50.0000 mg | ORAL_TABLET | Freq: Four times a day (QID) | ORAL | 0 refills | Status: DC | PRN

## 2018-05-24 NOTE — Patient Instructions (Signed)
See Dr. Denyse Amassorey for foot injection.  Get lumbar xray today.  Start diclofenac.  Use tramadol as needed.  Start PHysical therapy   Plantar Fasciitis Rehab Ask your health care provider which exercises are safe for you. Do exercises exactly as told by your health care provider and adjust them as directed. It is normal to feel mild stretching, pulling, tightness, or discomfort as you do these exercises, but you should stop right away if you feel sudden pain or your pain gets worse. Do not begin these exercises until told by your health care provider. Stretching and range of motion exercises These exercises warm up your muscles and joints and improve the movement and flexibility of your foot. These exercises also help to relieve pain. Exercise A: Plantar fascia stretch  1. Sit with your left / right leg crossed over your opposite knee. 2. Hold your heel with one hand with that thumb near your arch. With your other hand, hold your toes and gently pull them back toward the top of your foot. You should feel a stretch on the bottom of your toes or your foot or both. 3. Hold this stretch for__________ seconds. 4. Slowly release your toes and return to the starting position. Repeat __________ times. Complete this exercise __________ times a day. Exercise B: Gastroc, standing  1. Stand with your hands against a wall. 2. Extend your left / right leg behind you, and bend your front knee slightly. 3. Keeping your heels on the floor and keeping your back knee straight, shift your weight toward the wall without arching your back. You should feel a gentle stretch in your left / right calf. 4. Hold this position for __________ seconds. Repeat __________ times. Complete this exercise __________ times a day. Exercise C: Soleus, standing 1. Stand with your hands against a wall. 2. Extend your left / right leg behind you, and bend your front knee slightly. 3. Keeping your heels on the floor, bend your back knee  and slightly shift your weight over the back leg. You should feel a gentle stretch deep in your calf. 4. Hold this position for __________ seconds. Repeat __________ times. Complete this exercise __________ times a day. Exercise D: Gastrocsoleus, standing 1. Stand with the ball of your left / right foot on a step. The ball of your foot is on the walking surface, right under your toes. 2. Keep your other foot firmly on the same step. 3. Hold onto the wall or a railing for balance. 4. Slowly lift your other foot, allowing your body weight to press your heel down over the edge of the step. You should feel a stretch in your left / right calf. 5. Hold this position for __________ seconds. 6. Return both feet to the step. 7. Repeat this exercise with a slight bend in your left / right knee. Repeat __________ times with your left / right knee straight and __________ times with your left / right knee bent. Complete this exercise __________ times a day. Balance exercise This exercise builds your balance and strength control of your arch to help take pressure off your plantar fascia. Exercise E: Single leg stand 1. Without shoes, stand near a railing or in a doorway. You may hold onto the railing or door frame as needed. 2. Stand on your left / right foot. Keep your big toe down on the floor and try to keep your arch lifted. Do not let your foot roll inward. 3. Hold this position for __________ seconds.  4. If this exercise is too easy, you can try it with your eyes closed or while standing on a pillow. Repeat __________ times. Complete this exercise __________ times a day. This information is not intended to replace advice given to you by your health care provider. Make sure you discuss any questions you have with your health care provider. Document Released: 12/04/2005 Document Revised: 08/08/2016 Document Reviewed: 10/18/2015 Elsevier Interactive Patient Education  2018 [employer].

## 2018-05-24 NOTE — Progress Notes (Signed)
Subjective:     Patient ID: Joanne Winters, female   DOB: [birth year], 52 y.o.   MRN: [id number]  HPI 52 yo female presenting to the clinic for pain in the back of her left knee and leg and in the arch of her left foot. The posterior knee pain began greater than 7 months ago and has been worsening. The pain is from lower posterior thigh, through posterior knee, into upper calf. The pain is the worst when she is going down stairs and at night. The pain is a 4/10 at work and 9/10 at night. It keeps her up and she has used benadryl to help her sleep. She has used NSAIDs, ice, and voltaren gel for pain without much relief. The pain is achy, tight, and sharp. She also admits to some sharp lower back pain for some time. The pain is sharp. She works at [employer] wearing steel-toed for 10+ hours a day standing.   The foot pain has been going on for 1 month and is a burning pain in the plantar arch. It is worsened by standing and walking for long periods of time and improved with rest. She believes it is plantar fasciitis and that again her work boots and environment are contributing to the problem. She has tried using an exercise roller from the store for plantar fasciitis that did not help.  At her last visit she was given a prescription for Saxsenda but was unable to get the medication as she states she did not receive a call from the pharmacist.  .. Active Ambulatory Problems    Diagnosis Date Noted  . GERD (gastroesophageal reflux disease) 10/07/2014  . Seasonal allergies 10/07/2014  . Environmental allergies 10/07/2014  . Obesity 10/07/2014  . Lateral epicondylitis of right elbow 12/22/2014  . Thyroid goiter 12/22/2014  . Class 2 severe obesity due to excess calories with serious comorbidity and body mass index (BMI) of 36.0 to 36.9 in adult [facility]) 12/22/2014  . Abnormal weight gain 03/01/2015  . Right calf pain 04/26/2015  . Elevated blood pressure 04/26/2015  . Plantar wart 05/12/2015  . Pain in  lower limb 05/26/2015  . Essential hypertension 12/06/2015  . PUD (peptic ulcer disease) 12/15/2015  . 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 Motion sickness 07/16/2016  . Skin tag 07/16/2016  . Neuropathy 10/20/2016  . De Quervain's tenosynovitis, right 10/20/2016  . 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 Multiple thyroid nodules 02/28/2017  . Left knee pain 07/11/2017  . Plantar fasciitis, left 09/11/2017  . Heel spur, right 09/11/2017  . Hoarseness or changing voice 09/11/2017  . Throat tightness 09/11/2017  . Cough 02/13/2018  . Spasm of muscle, back 02/28/2018  . Hamstring strain, left, subsequent encounter 05/24/2018   Resolved Ambulatory Problems    Diagnosis Date Noted  . No Resolved Ambulatory Problems   Past Medical History:  Diagnosis Date  . GERD (gastroesophageal reflux disease)   . Seasonal allergies       Review of Systems  All other systems reviewed and are negative.     Objective:   Physical Exam  Constitutional: She appears well-developed and well-nourished. No distress.  HENT:  Head: Normocephalic and atraumatic.  Musculoskeletal:       Right knee: Normal.       Left knee: Tenderness (posterior knee down to upper calf) found.       Legs: Skin: Skin is warm and dry. She is not diaphoretic.   Pain to palpation of fascia of left plantar foot.  No left heel pain to palpation. Pain with plantar flexion.     Assessment:     Diagnoses and all orders for this visit:  Chronic pain of left knee -     DG Lumbar Spine Complete -     diclofenac (VOLTAREN) 75 MG EC tablet; Take 1 tablet (75 mg total) by mouth 2 (two) times daily. -     traMADol (ULTRAM) 50 MG tablet; Take 1 tablet (50 mg total) by mouth every 6 (six) hours as needed for moderate pain. -     Ambulatory referral to Physical Therapy  Class 2 severe obesity due to excess calories with serious comorbidity and body mass index (BMI) of 36.0 to 36.9 in adult (HCC) -     Liraglutide -Weight Management (SAXENDA) 18  MG/3ML SOPN; Inject 0.6 mg into the skin daily. For one week then increase by .6mg  weekly until reaches 3mg  daily.  Please include ultra fine needles 6mm  Hamstring strain, left, subsequent encounter -     diclofenac (VOLTAREN) 75 MG EC tablet; Take 1 tablet (75 mg total) by mouth 2 (two) times daily. -     Ambulatory referral to Physical Therapy  Plantar fasciitis, left -     diclofenac (VOLTAREN) 75 MG EC tablet; Take 1 tablet (75 mg total) by mouth 2 (two) times daily. -     traMADol (ULTRAM) 50 MG tablet; Take 1 tablet (50 mg total) by mouth every 6 (six) hours as needed for moderate pain.      Plan:     Patient is certainly having some continuation of her left posterior knee pain.  She is previously seen Dr. Denyse Amassorey and he thought the pain represented some arthritic changes.  He had suggested physical therapy and a possible MRI if not improving.  She never got physical therapy or an MRI.  The pain is been fairly persistent and it radiates up into her hamstring.  I do think she should try physical therapy.  I have asked that the physical therapist also focus not only on her knee but her hamstrings.  Patient was given diclofenac tablets to take twice a day as needed for pain and inflammation.  I would like for her to follow-up with Dr. Denyse Amassorey for potential injection and an MRI.  She is getting married tomorrow.  I did give her a few tramadol for pain.  Discussed to use sparingly and only as needed.  I did want to go ahead and get an x-ray of her lumbar spine.  She did have a positive straining sensation on straight leg test.  She does complain of some intermittent back pain.  [place] controlled substance database reviewed with no concerns.   Physical exam I think it represents plantar fasciitis.  Discussed good supportive inserts/shoes.  She works 10 hours a day in boots that are given to her and she is not allowed to take her boots off site.  I would like for her to follow-up with Dr. Denyse Amassorey for  plantar injections.  She can also consider at least getting some inserts for the shoes that she wears outside of work.  Handout given for stretches along with consideration of icing at the end of the day.  I do believe that weight loss could help both her chronic knee pain and plantar fasciitis.  She was unable to get the Saxenda was prescribed at last visit.  I did go ahead and resend this and gave her another coupon card.  We are unsure  if the insurance that are did not cover this at last visit.  Follow-up or call office if this is not covered.  We do need to discuss other options for weight loss.  Certainly encouraged her to increase her protein and decrease her carbs.  Encouraged some type of exercise however with her current pain this is making this very difficult.  [name] Kitchen.Spent 30 minutes with patient and greater than 50 percent of visit spent counseling patient regarding treatment plan.

## 2018-05-27 ENCOUNTER — Encounter: Payer: Self-pay | Admitting: Physician Assistant

## 2018-05-27 DIAGNOSIS — M51369 Other intervertebral disc degeneration, lumbar region without mention of lumbar back pain or lower extremity pain: Secondary | ICD-10-CM | POA: Insufficient documentation

## 2018-05-27 DIAGNOSIS — M5136 Other intervertebral disc degeneration, lumbar region: Secondary | ICD-10-CM | POA: Insufficient documentation

## 2018-05-27 NOTE — Progress Notes (Signed)
Call pt: arthritic changes in lumbar spine. Some disc space narrowing at L4 and L5. For now continue to monitor. I do not necessarily feel like pain is coming from lumbar spine.

## 2018-05-29 ENCOUNTER — Encounter: Payer: Self-pay | Admitting: Physician Assistant

## 2018-06-05 ENCOUNTER — Ambulatory Visit: Payer: 59 | Admitting: Family Medicine

## 2018-06-05 ENCOUNTER — Encounter: Payer: Self-pay | Admitting: Family Medicine

## 2018-06-05 VITALS — BP 153/74 | HR 85 | Wt 222.0 lb

## 2018-06-05 DIAGNOSIS — M25562 Pain in left knee: Secondary | ICD-10-CM | POA: Diagnosis not present

## 2018-06-05 DIAGNOSIS — G8929 Other chronic pain: Secondary | ICD-10-CM | POA: Diagnosis not present

## 2018-06-05 DIAGNOSIS — M7122 Synovial cyst of popliteal space [Baker], left knee: Secondary | ICD-10-CM

## 2018-06-05 DIAGNOSIS — M1712 Unilateral primary osteoarthritis, left knee: Secondary | ICD-10-CM | POA: Insufficient documentation

## 2018-06-05 DIAGNOSIS — M170 Bilateral primary osteoarthritis of knee: Secondary | ICD-10-CM | POA: Insufficient documentation

## 2018-06-05 MED ORDER — LORCASERIN HCL ER 20 MG PO TB24: 1.0000 | ORAL_TABLET | Freq: Every day | ORAL | 1 refills | Status: DC

## 2018-06-05 NOTE — Patient Instructions (Signed)
Thank you for coming in today. Call or go to the ER if you develop a large red swollen joint with extreme pain or oozing puss.  Let me know how knee is going.  If not doing well let me know or check.  Next step if the shot does not last very long is consider gel shot.   Continue exercise. Bike is great. Make sure the fit is correct.   For weight if we cannot get Saxenda we can try Belviq.  If we get stuck then we could use a cousin of sexanda ment for diabetes instead.   Gastric Bypass may be a good option.   [facility]  [email or web address]   Baker Cyst A Baker cyst, also called a popliteal cyst, is a sac-like growth that forms at the back of the knee. The cyst forms when the fluid-filled sac (bursa) that cushions the knee joint becomes enlarged. The bursa that becomes a Baker cyst is located at the back of the knee joint. What are the causes? In most cases, a Baker cyst results from another knee problem that causes swelling inside the knee. This makes the fluid inside the knee joint (synovial fluid) flow into the bursa behind the knee, causing the bursa to enlarge. What increases the risk? You may be more likely to develop a Baker cyst if you already have a knee problem, such as:  A tear in cartilage that cushions the knee joint (meniscal tear).  A tear in the tissues that connect the bones of the knee joint (ligament tear).  Knee swelling from osteoarthritis, rheumatoid arthritis, or gout.  What are the signs or symptoms? A Baker cyst does not always cause symptoms. A lump behind the knee may be the only sign of the condition. The lump may be painful, especially when the knee is straightened. If the lump is painful, the pain may come and go. The knee may also be stiff. Symptoms may quickly get more severe if the cyst breaks open (ruptures). If your cyst ruptures, signs and symptoms may affect the knee and the back of the lower leg (calf) and  may include:  Sudden or worsening pain.  Swelling.  Bruising.  How is this diagnosed? This condition may be diagnosed based on your symptoms and medical history. Your health care provider will also do a physical exam. This may include:  Feeling the cyst to check whether it is tender.  Checking your knee for signs of another knee condition that causes swelling.  You may have imaging tests, such as:  X-rays.  MRI.  Ultrasound.  How is this treated? A Baker cyst that is not painful may go away without treatment. If the cyst gets large or painful, it will likely get better if the underlying knee problem is treated. Treatment for a Baker cyst may include:  Resting.  Keeping weight off of the knee. This means not leaning on the knee to support your body weight.  NSAIDs to reduce pain and swelling.  A procedure to drain the fluid from the cyst with a needle (aspiration). You may also get an injection of a medicine that reduces swelling (steroid).  Surgery. This may be needed if other treatments do not work. This usually involves correcting knee damage and removing the cyst.  Follow these instructions at home:  Take over-the-counter and prescription medicines only as told by your health care provider.  Rest and return to your normal activities as told by your health care provider.  Avoid activities that make pain or swelling worse. Ask your health care provider what activities are safe for you.  Keep all follow-up visits as told by your health care provider. This is important. Contact a health care provider if:  You have knee pain, stiffness, or swelling that does not get better. Get help right away if:  You have sudden or worsening pain and swelling in your calf area. This information is not intended to replace advice given to you by your health care provider. Make sure you discuss any questions you have with your health care provider. Document Released: 12/04/2005 Document  Revised: 08/24/2016 Document Reviewed: 08/24/2016 Elsevier Interactive Patient Education  2018 [employer].

## 2018-06-05 NOTE — Progress Notes (Signed)
Joanne Winters is a 52 y.o. female who presents to [facility] today for left knee pain.   Joanne Winters was seen in August 2018 for left knee pain thought to be related to DJD.  At that point she had a trial of some conservative management including diclofenac gel which did not help and home exercise program.  She declined injection and was somewhat lost to follow-up.  She returns today complaining of continued left knee pain.  She notes the knee pain and swelling.  She denies clicking locking or giving way.  She notes the pain is worse with activity and better with rest.  She is climbing stairs is quite painful.  She is seen by her primary care provider recently who prescribed diclofenac tablets.  She notes she is not taking these but has taken some naproxen which helps a bit.  Additionally she was seen by her primary care provider on June 7.  At that time she was diagnosed with morbid obesity with a BMI greater than 35 with multiple comorbid risk factors including hypertension, knee DJD, GERD.  After discussion she was prescribed Saxenda which unfortunately is too expensive at $1200 and not particularly well covered by your health insurance.  She is interested in medications for weight loss and interested in exercise that will not hurt her knee.  Additionally she is interested in learning more about bariatric surgery options.    ROS:  As above  Exam:  BP (!) 153/74   Pulse 85   Wt 222 lb (100.7 kg)   BMI 36.94 kg/m  General: Well Developed, well nourished, and in no acute distress.  Neuro/Psych: Alert and oriented x3, extra-ocular muscles intact, able to move all 4 extremities, sensation grossly intact. Skin: Warm and dry, no rashes noted.  Respiratory: Not using accessory muscles, speaking in full sentences, trachea midline.  Cardiovascular: Pulses palpable, no extremity edema. Abdomen: Does not appear distended. MSK:  Left knee moderate effusion  no erythema Range of motion 0-120 degrees Tender to palpation medial joint line.  Palpable fullness posterior medial knee Stable ligamentous exam. Intact flexion and extension strength.  Procedure: Real-time Ultrasound Guided Injection of left knee  Device: GE Logiq E   Images permanently stored and available for review in the ultrasound unit. Verbal informed consent obtained.  Discussed risks and benefits of procedure. Warned about infection bleeding damage to structures skin hypopigmentation and fat atrophy among others. Patient expresses understanding and agreement Time-out conducted.   Noted no overlying erythema, induration, or other signs of local infection.   Skin prepped in a sterile fashion.   Local anesthesia: Topical Ethyl chloride.   With sterile technique and under real time ultrasound guidance:  80mg  kenalog and 4ml marcaine injected easily.   Completed without difficulty   Pain immediately resolved suggesting accurate placement of the medication.   Advised to call if fevers/chills, erythema, induration, drainage, or persistent bleeding.   Images permanently stored and available for review in the ultrasound unit.  Impression: Technically successful ultrasound guided injection.   Lab and Radiology Results  Limited musculoskeletal ultrasound of the left knee: Moderate effusion present with a 2.5 x 1.1 cm Baker's cyst present    EXAM: RIGHT KNEE - 1-2 VIEW; LEFT KNEE - COMPLETE 4+ VIEW  COMPARISON:  None  FINDINGS: LEFT knee:  Osseous demineralization.  Mild joint space narrowing.  No acute fracture, dislocation, or bone destruction.  Small spurs at margins of patella.  No knee joint effusion.  RIGHT knee:  Mild joint space narrowing.  Osseous demineralization.  No gross evidence of fracture or dislocation on AP views.  IMPRESSION: Osseous demineralization with mild degenerative changes of both knees.   Electronically Signed   By:  Joanne SouthwardMark  Winters M.D.   On: 07/11/2017 14:11 I personally (independently) visualized and performed the interpretation of the images attached in this note.   Assessment and Plan: 52 y.o. 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  Plan for injection today and weight loss and quad strengthening.  Recheck in the near future if not doing well.  Discussed the possibility of aspirating Baker's cyst in the future if not improved with simple injection.  Morbid obesity: Discussed options.  Trial of Belviq if Joanne CoveySaxenda is not covered.  If this is not covered other GLP-1 agonist may be reasonable.  Additionally I think is reasonable for her to have a informational session with central, and surgery to discuss her bariatric surgery options.  I do believe she would meet criteria.  Will discuss with PCP.    No orders of the defined types were placed in this encounter.  Meds ordered this encounter  Medications  . Lorcaserin HCl ER (BELVIQ XR) 20 MG TB24    Sig: Take 1 tablet by mouth daily.    Dispense:  30 tablet    Refill:  1    Historical information moved to improve visibility of documentation.  Past Medical History:  Diagnosis Date  . Essential hypertension 12/06/2015  . GERD (gastroesophageal reflux disease)   . Morbid obesity (HCC) 12/22/2014  . Seasonal allergies    Past Surgical History:  Procedure Laterality Date  . ABLATION  2013  . CESAREAN SECTION    . gall stone  1995   Social History   Tobacco Use  . Smoking status: Never Smoker  . Smokeless tobacco: Never Used  Substance Use Topics  . Alcohol use: No    Alcohol/week: 0.0 oz   family history includes Cancer in her maternal grandmother and paternal grandmother; Cancer - Colon in her father; Hypertension in her daughter, father, maternal grandmother, mother, and paternal grandmother.  Medications: Current Outpatient Medications  Medication Sig Dispense Refill  . albuterol (PROAIR HFA) 108 (90 Base)  MCG/ACT inhaler Inhale 1-2 puffs into the lungs every 6 (six) hours as needed for wheezing or shortness of breath. 1 Inhaler 2  . ALPRAZolam (XANAX) 0.5 MG tablet Take 1-2 tablets as needed for flying and travel. 20 tablet 0  . diclofenac (VOLTAREN) 75 MG EC tablet Take 1 tablet (75 mg total) by mouth 2 (two) times daily. 60 tablet 1  . hydrochlorothiazide (HYDRODIURIL) 25 MG tablet TAKE 1 TABLET BY MOUTH  EVERY MORNING FOR BLOOD  PRESSURE CONTROL 90 tablet 1  . montelukast (SINGULAIR) 10 MG tablet Take 1 tablet (10 mg total) by mouth at bedtime. 90 tablet 4  . pantoprazole (PROTONIX) 40 MG tablet Take 1 tablet (40 mg total) by mouth daily. 90 tablet 3  . traMADol (ULTRAM) 50 MG tablet Take 1 tablet (50 mg total) by mouth every 6 (six) hours as needed for moderate pain. 20 tablet 0  . Lorcaserin HCl ER (BELVIQ XR) 20 MG TB24 Take 1 tablet by mouth daily. 30 tablet 1   No current facility-administered medications for this visit.    Allergies  Allergen Reactions  . Hydrocodone Other (See Comments)      Discussed warning signs or symptoms. Please see discharge instructions. Patient  expresses understanding.

## 2018-06-06 ENCOUNTER — Ambulatory Visit: Payer: 59 | Admitting: Family Medicine

## 2018-06-06 ENCOUNTER — Ambulatory Visit: Payer: 59 | Admitting: Rehabilitative and Restorative Service Providers"

## 2018-07-02 ENCOUNTER — Encounter: Payer: 59 | Admitting: Physician Assistant

## 2018-07-05 ENCOUNTER — Ambulatory Visit (INDEPENDENT_AMBULATORY_CARE_PROVIDER_SITE_OTHER): Payer: 59 | Admitting: Physician Assistant

## 2018-07-05 ENCOUNTER — Encounter: Payer: Self-pay | Admitting: Physician Assistant

## 2018-07-05 VITALS — BP 132/71 | HR 66 | Temp 98.3°F | Ht 65.0 in | Wt 221.0 lb

## 2018-07-05 DIAGNOSIS — E042 Nontoxic multinodular goiter: Secondary | ICD-10-CM

## 2018-07-05 DIAGNOSIS — Z13220 Encounter for screening for lipoid disorders: Secondary | ICD-10-CM

## 2018-07-05 DIAGNOSIS — Z131 Encounter for screening for diabetes mellitus: Secondary | ICD-10-CM

## 2018-07-05 DIAGNOSIS — E6609 Other obesity due to excess calories: Secondary | ICD-10-CM

## 2018-07-05 DIAGNOSIS — M25562 Pain in left knee: Secondary | ICD-10-CM

## 2018-07-05 DIAGNOSIS — I10 Essential (primary) hypertension: Secondary | ICD-10-CM | POA: Diagnosis not present

## 2018-07-05 DIAGNOSIS — Z6836 Body mass index (BMI) 36.0-36.9, adult: Secondary | ICD-10-CM

## 2018-07-05 DIAGNOSIS — E049 Nontoxic goiter, unspecified: Secondary | ICD-10-CM

## 2018-07-05 DIAGNOSIS — Z0000 Encounter for general adult medical examination without abnormal findings: Secondary | ICD-10-CM

## 2018-07-05 DIAGNOSIS — M7122 Synovial cyst of popliteal space [Baker], left knee: Secondary | ICD-10-CM

## 2018-07-05 DIAGNOSIS — Z1211 Encounter for screening for malignant neoplasm of colon: Secondary | ICD-10-CM

## 2018-07-05 NOTE — Progress Notes (Signed)
Subjective:     Joanne Winters is a 52 y.o. female here for a routine exam.   Current complaints: left knee pain and weight. She has seen Dr. Denyse Amass for left knee pain. She was given a steroid shot. Discussed bakers cyst removal at that visit but was not done. She has not had much relief. Tramadol made her very nauseated. She started belviq but has not felt like it helps very much. Weight is the same today. Phentermine worked the first time but not really the second time. She has tried nutrisystem as well.  Personal health questionnaire reviewed: yes.     Gynecologic History No LMP recorded. Patient has had an injection. Contraception: none she is in a homosexual relationship.  Last Pap: 12/22/2014. Results were: normal.  Last mammogram: 05/15/18. Results were: normal.   Obstetric History OB History  No data available     The following portions of the patient's history were reviewed and updated as appropriate: allergies, current medications, past family history, past medical history, past social history, past surgical history and problem list.  Review of Systems Pertinent items noted in HPI and remainder of comprehensive ROS otherwise negative.    Objective:    BP 132/71   Pulse 66   Temp 98.3 F (36.8 C) (Oral)   Ht 5\' 5"  (1.651 m)   Wt 221 lb (100.2 kg)   BMI 36.78 kg/m   General Appearance:    Alert, cooperative, no distress, appears stated age  Head:    Normocephalic, without obvious abnormality, atraumatic  Eyes:    PERRL, conjunctiva/corneas clear, EOM's intact, fundi    benign, both eyes  Ears:    Normal TM's and external ear canals, both ears  Nose:   Nares normal, septum midline, mucosa normal, no drainage    or sinus tenderness  Throat:   Lips, mucosa, and tongue normal; teeth and gums normal  Neck:   Supple, symmetrical, trachea midline, no adenopathy;    thyroid:  no enlargement/tenderness/nodules; no carotid   bruit or JVD  Back:     Symmetric, no curvature, ROM  normal, no CVA tenderness  Lungs:     Clear to auscultation bilaterally, respirations unlabored  Chest Wall:    No tenderness or deformity   Heart:    Regular rate and rhythm, S1 and S2 normal, no murmur, rub   or gallop     Abdomen:     Soft, non-tender, bowel sounds active all four quadrants,    no masses, no organomegaly        Extremities:   Extremities normal, atraumatic, no cyanosis or edema  Pulses:   2+ and symmetric all extremities  Skin:   Skin color, texture, turgor normal, no rashes or lesions  Lymph nodes:   Cervical, supraclavicular, and axillary nodes normal  Neurologic:   CNII-XII intact, normal strength, sensation and reflexes    throughout      Assessment:    Healthy female exam.    Plan:  [name] KitchenMarland KitchenPearlie was seen today for annual exam.  Diagnoses and all orders for this visit:  Routine physical examination -     Lipid Panel w/reflex Direct LDL -     COMPLETE METABOLIC PANEL WITH GFR -     CBC -     TSH -     US THYROID  Colon cancer screening -     Ambulatory referral to Gastroenterology  Essential hypertension  Thyroid goiter  Multiple thyroid nodules -     TSH -  US THYROID  Baker's cyst of knee, left -     Ambulatory referral to Orthopedic Surgery  Class 2 obesity due to excess calories without serious comorbidity with body mass index (BMI) of 36.0 to 36.9 in adult -     Amb Referral to Bariatric Surgery  Screening for lipid disorders -     Lipid Panel w/reflex Direct LDL  Screening for diabetes mellitus -     COMPLETE METABOLIC PANEL WITH GFR  Acute pain of left knee -     Ambulatory referral to Orthopedic Surgery  .[name] Kitchen. Depression screen [facility]HQ 2/9 05/24/2018 02/27/2017  Decreased Interest 0 0  Down, Depressed, Hopeless 2 0  PHQ - 2 Score 2 0  Altered sleeping 3 -  Tired, decreased energy 3 -  Change in appetite 0 -  Feeling bad or failure about yourself  1 -  Trouble concentrating 0 -  Moving slowly or fidgety/restless 0 -  PHQ-9  Score 9 -   .[name] Kitchen. Discussed 150 minutes of exercise a week.  Encouraged vitamin D 1000 units and Calcium 1300mg  or 4 servings of dairy a day.  Mammogram up to date.  Colonoscopy ordered.  Opted to do pap smear every 5 years with cotesting.   Discussed left knee pain. Continue NSAIDs. Made referral to ortho at patients request. Discussed weight. Made referral to bariatrics. Discussed saxenda. Told patient to call and see if was covered. [name] Kitchen..Discussed low carb diet with 1500 calories and 80g of protein.  Exercising at least 150 minutes a week.  My Fitness Pal could be a [employer] resource.      Education reviewed: low fat, low cholesterol diet. Follow up in: 1 year.   [name] Kitchen.Joanne Winters, Joanne Karel PA-C, have reviewed and agree with the above documentation in it's entirety.

## 2018-07-05 NOTE — Patient Instructions (Signed)

## 2018-07-06 LAB — COMPLETE METABOLIC PANEL WITH GFR
AG RATIO: 1.4 (calc) (ref 1.0–2.5)
ALT: 12 U/L (ref 6–29)
AST: 12 U/L (ref 10–35)
Albumin: 4.2 g/dL (ref 3.6–5.1)
Alkaline phosphatase (APISO): 54 U/L (ref 33–130)
BUN: 12 mg/dL (ref 7–25)
CALCIUM: 9.5 mg/dL (ref 8.6–10.4)
CO2: 28 mmol/L (ref 20–32)
Chloride: 102 mmol/L (ref 98–110)
Creat: 0.88 mg/dL (ref 0.50–1.05)
GFR, EST AFRICAN AMERICAN: 88 mL/min/{1.73_m2} (ref >=60)
GFR, EST NON AFRICAN AMERICAN: 76 mL/min/{1.73_m2} (ref >=60)
Globulin: 2.9 g/dL (calc) (ref 1.9–3.7)
Glucose, Bld: 86 mg/dL (ref 65–99)
POTASSIUM: 4.3 mmol/L (ref 3.5–5.3)
SODIUM: 137 mmol/L (ref 135–146)
TOTAL PROTEIN: 7.1 g/dL (ref 6.1–8.1)
Total Bilirubin: 0.4 mg/dL (ref 0.2–1.2)

## 2018-07-06 LAB — CBC
HEMATOCRIT: 36.2 % (ref 35.0–45.0)
HEMOGLOBIN: 11.7 g/dL (ref 11.7–15.5)
MCH: 25.9 pg — ABNORMAL LOW (ref 27.0–33.0)
MCHC: 32.3 g/dL (ref 32.0–36.0)
MCV: 80.1 fL (ref 80.0–100.0)
MPV: 10.5 fL (ref 7.5–12.5)
Platelets: 256 10*3/uL (ref 140–400)
RBC: 4.52 10*6/uL (ref 3.80–5.10)
RDW: 14.2 % (ref 11.0–15.0)
WBC: 11.2 10*3/uL — ABNORMAL HIGH (ref 3.8–10.8)

## 2018-07-06 LAB — TSH: TSH: 1.25 mIU/L

## 2018-07-06 LAB — LIPID PANEL W/REFLEX DIRECT LDL
Cholesterol: 180 mg/dL (ref <200)
HDL: 57 mg/dL (ref >50)
LDL Cholesterol (Calc): 107 mg/dL (calc) — ABNORMAL HIGH
Non-HDL Cholesterol (Calc): 123 mg/dL (calc) (ref <130)
Total CHOL/HDL Ratio: 3.2 (calc) (ref <5.0)
Triglycerides: 75 mg/dL (ref <150)

## 2018-07-07 NOTE — Progress Notes (Signed)
Call pt: cholesterol looks great. Thyroid wonderful. Kidney and liver look great. Sugars good. WBC up just a tad. Likely fighting a virus off.

## 2018-07-19 ENCOUNTER — Ambulatory Visit (INDEPENDENT_AMBULATORY_CARE_PROVIDER_SITE_OTHER): Payer: 59 | Admitting: Family Medicine

## 2018-07-19 ENCOUNTER — Ambulatory Visit (INDEPENDENT_AMBULATORY_CARE_PROVIDER_SITE_OTHER): Payer: 59

## 2018-07-19 ENCOUNTER — Encounter: Payer: Self-pay | Admitting: Family Medicine

## 2018-07-19 VITALS — BP 152/77 | HR 76 | Temp 98.7°F | Ht 65.0 in | Wt 222.2 lb

## 2018-07-19 DIAGNOSIS — M7122 Synovial cyst of popliteal space [Baker], left knee: Secondary | ICD-10-CM

## 2018-07-19 DIAGNOSIS — E042 Nontoxic multinodular goiter: Secondary | ICD-10-CM | POA: Diagnosis not present

## 2018-07-19 DIAGNOSIS — M25562 Pain in left knee: Secondary | ICD-10-CM | POA: Diagnosis not present

## 2018-07-19 NOTE — Patient Instructions (Signed)
Thank you for coming in today. Avoid the stepping machine at work.  Follow up as needed.  If not better we have other shots.   For weight try to get 1500 calories per day diet.  Try to get about 1/3 of your calories as protein. My fitnesspal app is free and you can use it to track calories and adjust the protein goal.

## 2018-07-19 NOTE — Progress Notes (Signed)
Call pt: thyroid nodules are stable and do not meet biopsy criteria. Great news.

## 2018-07-19 NOTE — Progress Notes (Signed)
Joanne Winters is a 52 y.o. female who presents to [facility] today for follow-up left knee pain and discuss obesity.  Joanne Winters was seen about 6 weeks ago for left knee pain.  At that time she was thought to have knee pain due to DJD.  She does found to have a small Baker's cyst.  She had a intra-articular steroid injection which helped for a week or 2.  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  Since she started this new job duties her knee pain has become worsened.  She describes her pain is mostly anterior medial pain.  She does have some discomfort at the posterior medial aspect of her knee but notes that not very significant at all.  She has symptoms are moderate interfere with her normal life activities.  She continues to try conservative measures including oral NSAIDs.  Her PCP has referred her to orthopedic surgeon Dr. Cleophas Dunker on August 16th.   Additionally Joanne Winters continues to struggle with weight loss.  She has trouble losing weight.  She is not really following much of an eating plan and tends to eat more carbohydrates.  She has had trouble affording or using FDA  approved medications for weight loss in the past.    ROS:  As above  Exam:  BP (!) 152/77   Pulse 76   Temp 98.7 F (37.1 C) (Oral)   Ht 5\' 5"  (1.651 m)   Wt 222 lb 3.2 oz (100.8 kg)   BMI 36.98 kg/m  General: Well Developed, well nourished, and in no acute distress.  Neuro/Psych: Alert and oriented x3, extra-ocular muscles intact, able to move all 4 extremities, sensation grossly intact. Skin: Warm and dry, no rashes noted.  Respiratory: Not using accessory muscles, speaking in full sentences, trachea midline.  Cardiovascular: Pulses palpable, no extremity edema. Abdomen: Does not appear distended. MSK:  Left knee: Mild effusion. Motion 0-120 degrees. Mildly tender to palpation  medial joint line.  Nontender posterior medial knee.  No significant masses palpated posterior medial knee. Stable ligamentous exam. Negative McMurray's test. Intact flexion and extension strength.     Lab and Radiology Results Limited musculoskeletal ultrasound of left knee reveals a mild effusion. Patient does have a small Baker's cyst measuring less than 2 x 2 cm.  Nontender to palpation with ultrasound probe. Normal bony structures otherwise   Assessment and Plan: 52 y.o. female with  Left knee pain: Patient does have a small effusion and a small Baker's cyst.  She had a steroid injection about 6 weeks ago that helped a bit.  However I believe the reason her pain worsened is because her job duties changed requiring lots of stepping up and down which certainly has exacerbated her knee pain.  I do not believe that draining her Baker's cyst today will achieve much.  It small and her pain is more anterior than posterior.  Additionally we would like to avoid overuse of steroids if possible.  After discussion with patient plan to proceed with changing job duties and continue typical conservative measures including focus on weight loss and quad strengthening.  It is reasonable to follow-up with orthopedics as scheduled.  If not any improvement in the next few weeks with changing job duties then it certainly would be reasonable to proceed with aspiration or injection.  Work note provided today.  Obesity: Discussion regarding obesity strategies.  After using a calorie calculator she currently needs to consume about 1900 cal/day to maintain her current body weight without much exercise.  Recommend reducing calorie intake to 1500 cal/day.  Additionally recommend reducing carbohydrates and increasing protein.  Recommend try to get 30% of her calories per day and protein.  Recommend a calorie count application like myfitnesspal.  Additionally recommend researching 1500 -calorie/day eating plans and  recipes.  Follow-up with PCP or myself in the near future as needed.     I spent 25 minutes with this patient, greater than 50% was face-to-face time counseling regarding ddx and plan.   Historical information moved to improve visibility of documentation.  Past Medical History:  Diagnosis Date  . Essential hypertension 12/06/2015  . GERD (gastroesophageal reflux disease)   . Morbid obesity (HCC) 12/22/2014  . Seasonal allergies    Past Surgical History:  Procedure Laterality Date  . ABLATION  2013  . CESAREAN SECTION    . gall stone  1995   Social History   Tobacco Use  . Smoking status: Never Smoker  . Smokeless tobacco: Never Used  Substance Use Topics  . Alcohol use: No    Alcohol/week: 0.0 oz   family history includes Cancer in her maternal grandmother and paternal grandmother; Cancer - Colon in her father; Hypertension in her daughter, father, maternal grandmother, mother, and paternal grandmother.  Medications: Current Outpatient Medications  Medication Sig Dispense Refill  . albuterol (PROAIR HFA) 108 (90 Base) MCG/ACT inhaler Inhale 1-2 puffs into the lungs every 6 (six) hours as needed for wheezing or shortness of breath. 1 Inhaler 2  . ALPRAZolam (XANAX) 0.5 MG tablet Take 1-2 tablets as needed for flying and travel. 20 tablet 0  . diclofenac (VOLTAREN) 75 MG EC tablet Take 1 tablet (75 mg total) by mouth 2 (two) times daily. 60 tablet 1  . hydrochlorothiazide (HYDRODIURIL) 25 MG tablet TAKE 1 TABLET BY MOUTH  EVERY MORNING FOR BLOOD  PRESSURE CONTROL 90 tablet 1  . Lorcaserin HCl ER (BELVIQ XR) 20 MG TB24 Take 1 tablet by mouth daily. 30 tablet 1  . montelukast (SINGULAIR) 10 MG tablet Take 1 tablet (10 mg total) by mouth at bedtime. 90 tablet 4  . pantoprazole (PROTONIX) 40 MG tablet Take 1 tablet (40 mg total) by mouth daily. 90 tablet 3   No current facility-administered medications for this visit.    Allergies  Allergen Reactions  . Hydrocodone Other (See  Comments)  . Tramadol     nausea      Discussed warning signs or symptoms. Please see discharge instructions. Patient expresses understanding.

## 2018-07-20 ENCOUNTER — Other Ambulatory Visit: Payer: Self-pay | Admitting: Physician Assistant

## 2018-07-20 DIAGNOSIS — G8929 Other chronic pain: Secondary | ICD-10-CM

## 2018-07-20 DIAGNOSIS — M25562 Pain in left knee: Principal | ICD-10-CM

## 2018-07-20 DIAGNOSIS — M722 Plantar fascial fibromatosis: Secondary | ICD-10-CM

## 2018-07-20 DIAGNOSIS — S76312D Strain of muscle, fascia and tendon of the posterior muscle group at thigh level, left thigh, subsequent encounter: Secondary | ICD-10-CM

## 2018-08-02 ENCOUNTER — Encounter (INDEPENDENT_AMBULATORY_CARE_PROVIDER_SITE_OTHER): Payer: Self-pay | Admitting: Orthopaedic Surgery

## 2018-08-02 ENCOUNTER — Telehealth (INDEPENDENT_AMBULATORY_CARE_PROVIDER_SITE_OTHER): Payer: Self-pay | Admitting: Orthopaedic Surgery

## 2018-08-02 ENCOUNTER — Other Ambulatory Visit (INDEPENDENT_AMBULATORY_CARE_PROVIDER_SITE_OTHER): Payer: Self-pay | Admitting: Radiology

## 2018-08-02 ENCOUNTER — Ambulatory Visit (INDEPENDENT_AMBULATORY_CARE_PROVIDER_SITE_OTHER): Payer: 59 | Admitting: Orthopaedic Surgery

## 2018-08-02 ENCOUNTER — Ambulatory Visit (INDEPENDENT_AMBULATORY_CARE_PROVIDER_SITE_OTHER): Payer: 59

## 2018-08-02 VITALS — BP 139/85 | HR 66 | Ht 65.0 in | Wt 221.0 lb

## 2018-08-02 DIAGNOSIS — G8929 Other chronic pain: Secondary | ICD-10-CM

## 2018-08-02 DIAGNOSIS — M25562 Pain in left knee: Secondary | ICD-10-CM | POA: Diagnosis not present

## 2018-08-02 NOTE — Telephone Encounter (Signed)
Patient would like to go to [facility] in [place] for MRI? Please advise.

## 2018-08-02 NOTE — Telephone Encounter (Signed)
SENT NEW MRI REFERRAL TO [facility]

## 2018-08-02 NOTE — Progress Notes (Signed)
Office Visit Note   Patient: Joanne Winters           Date of Birth: 03/07/1966           MRN: [id number] Visit Date: 08/02/2018              Requested by: Joanne Winters, Joanne L, PA-C [address] [place], [place] [id number] PCP: Joanne Winters, Joanne L, PA-C   Assessment & Plan: Visit Diagnoses:  1. Chronic pain of left knee     Plan: Chronic, recurrent pain left knee.  Films demonstrate mild to moderate osteoarthritis.  Will order MRI scan as patient has been unresponsive to over-the-counter medicines and recent intra-articular cortisone injection.  Could have pathology accounting for pain other than just the osteoarthritis  Follow-Up Instructions: Return after MRI left knee.   Orders:  Orders Placed This Encounter  Procedures  . XR KNEE 3 VIEW LEFT   No orders of the defined types were placed in this encounter.     Procedures: No procedures performed   Clinical Data: No additional findings.   Subjective: Chief Complaint  Patient presents with  . New Patient (Initial Visit)    Winters knee bakers cyst dr Joanne Winters diagnosed w US. then referred her here. Winters knee pain on and off fo r1 yr gotten worse last 1.5 mo. tried diclofenac sodium 75mg  tab didnt help or upset stomach. hard to walk at times. posterior knee pain radiates down to leg muscles  Joanne Winters is 52 years old and is seen for evaluation of chronic, recurrent left knee pain.  She has had trouble for at least a year.  She is tried over-the-counter medicines with recurrent pain and swelling predominant along the medial aspect of her knee.  She recently was seen by her primary care physician who tried an intra-articular cortisone injection without much relief.  She did have an ultrasound evaluation with evidence of a small Baker's cyst.  She has not had any injury or trauma.  She works at a job where she stands a good part of the day up to 10 hours.  She is not having any other joint complaints.  No fever or chills.  No skin  rashes.  No evidence of instability.  HPI  Review of Systems  Constitutional: Negative for fatigue and fever.  HENT: Negative for ear pain.   Eyes: Negative for pain.  Respiratory: Negative for cough and shortness of breath.   Cardiovascular: Positive for leg swelling.  Gastrointestinal: Negative for constipation and diarrhea.  Genitourinary: Negative for difficulty urinating.  Musculoskeletal: Negative for back pain and neck pain.  Skin: Negative for rash.  Allergic/Immunologic: Negative for food allergies.  Neurological: Positive for weakness and numbness.  Hematological: Does not bruise/bleed easily.  Psychiatric/Behavioral: Positive for sleep disturbance.     Objective: Vital Signs: BP 139/85 (BP Location: Left Arm, Patient Position: Sitting, Cuff Size: Normal)   Pulse 66   Ht 5\' 5"  (1.651 m)   Wt 221 lb (100.2 kg)   BMI 36.78 kg/m   Physical Exam  Constitutional: She is oriented to person, place, and time. She appears well-developed and well-nourished.  HENT:  Mouth/Throat: Oropharynx is clear and moist.  Eyes: Pupils are equal, round, and reactive to light. EOM are normal.  Pulmonary/Chest: Effort normal.  Neurological: She is alert and oriented to person, place, and time.  Skin: Skin is warm and dry.  Psychiatric: She has a normal mood and affect. Her behavior is normal.  Ortho Exam awake alert and oriented x3.  Comfortable sitting.  Unusual pain reaction just touching her left knee.  I do not think she has an effusion.  Predominant medial joint pain.  None laterally.  Full extension and flexion over 105 degrees without instability.  Some fullness in the popliteal space.  No calf pain or distal edema.  Neurovascular exam intact.  Straight leg raise negative.  Painless range of motion both hips.  Specialty Comments:  No specialty comments available.  Imaging: Xr Knee 3 View Left  Result Date: 08/02/2018 Films of the left knee were obtained in several  projections standing.  There is some mild decrease in the medial joint space with mild peripheral osteophytes noted medially minimal amount of varus.  Some subchondral sclerosis in the femur and the medial compartment mild lateral patella tilt on the patellofemoral views.  Films consistent with mild to moderate osteoarthritis    PMFS History: Patient Active Problem List   Diagnosis Date Noted  . Baker's cyst of knee, left 06/05/2018  . DDD (degenerative disc disease), lumbar 05/27/2018  . Cough 02/13/2018  . Plantar fasciitis, left 09/11/2017  . Hoarseness or changing voice 09/11/2017  . Left knee pain 07/11/2017  . Multiple thyroid nodules 02/28/2017  . 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 Neuropathy 10/20/2016  . De Quervain's tenosynovitis, right 10/20/2016  . 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 Motion sickness 07/16/2016  . Skin tag 07/16/2016  . PUD (peptic ulcer disease) 12/15/2015  . Essential hypertension 12/06/2015  . Plantar wart 05/12/2015  . Thyroid goiter 12/22/2014  . GERD (gastroesophageal reflux disease) 10/07/2014  . Seasonal allergies 10/07/2014  . Environmental allergies 10/07/2014  . Morbid obesity (HCC) 10/07/2014   Past Medical History:  Diagnosis Date  . Essential hypertension 12/06/2015  . GERD (gastroesophageal reflux disease)   . Morbid obesity (HCC) 12/22/2014  . Seasonal allergies     Family History  Problem Relation Age of Onset  . Hypertension Mother   . Hypertension Father   . Cancer - Colon Father   . Hypertension Daughter   . Cancer Maternal Grandmother        breast  . Hypertension Maternal Grandmother   . Cancer Paternal Grandmother        breast  . Hypertension Paternal Grandmother     Past Surgical History:  Procedure Laterality Date  . ABLATION  2013  . CESAREAN SECTION    . gall stone  1995   Social History   Occupational History  . Not on file  Tobacco Use  . Smoking status: Never Smoker  . Smokeless tobacco: Never Used  Substance  and Sexual Activity  . Alcohol use: Yes    Alcohol/week: 0.0 standard drinks    Comment: occ  . Drug use: No  . Sexual activity: Not on file

## 2018-08-05 ENCOUNTER — Telehealth (INDEPENDENT_AMBULATORY_CARE_PROVIDER_SITE_OTHER): Payer: Self-pay | Admitting: Orthopaedic Surgery

## 2018-08-05 NOTE — Telephone Encounter (Signed)
LMOM for patient, MRI ordered at [facility]

## 2018-08-05 NOTE — Telephone Encounter (Signed)
Patient called stating she wanted to make sure her referral for the MRI was sent to [facility] in [place].  Patient requested a return call to verify location.

## 2018-08-09 ENCOUNTER — Other Ambulatory Visit: Payer: Self-pay | Admitting: Physician Assistant

## 2018-08-09 DIAGNOSIS — I10 Essential (primary) hypertension: Secondary | ICD-10-CM

## 2018-08-12 ENCOUNTER — Ambulatory Visit (INDEPENDENT_AMBULATORY_CARE_PROVIDER_SITE_OTHER): Payer: 59

## 2018-08-12 DIAGNOSIS — M94262 Chondromalacia, left knee: Secondary | ICD-10-CM | POA: Diagnosis not present

## 2018-08-12 DIAGNOSIS — G8929 Other chronic pain: Secondary | ICD-10-CM | POA: Diagnosis not present

## 2018-08-12 DIAGNOSIS — M25562 Pain in left knee: Principal | ICD-10-CM

## 2018-08-13 ENCOUNTER — Ambulatory Visit: Payer: 59 | Admitting: Family Medicine

## 2018-08-13 ENCOUNTER — Encounter: Payer: Self-pay | Admitting: Family Medicine

## 2018-08-13 ENCOUNTER — Ambulatory Visit: Payer: 59 | Admitting: Physician Assistant

## 2018-08-13 VITALS — BP 148/87 | HR 84 | Ht 63.0 in | Wt 223.0 lb

## 2018-08-13 DIAGNOSIS — S83412A Sprain of medial collateral ligament of left knee, initial encounter: Secondary | ICD-10-CM

## 2018-08-13 DIAGNOSIS — M7989 Other specified soft tissue disorders: Secondary | ICD-10-CM | POA: Diagnosis not present

## 2018-08-13 DIAGNOSIS — S72415A Nondisplaced unspecified condyle fracture of lower end of left femur, initial encounter for closed fracture: Secondary | ICD-10-CM

## 2018-08-13 NOTE — Patient Instructions (Addendum)
Thank you for coming in today. You should hear about the ultrasound soon.  Use the hinged brace and crutches.  Keep the weight off knee.  Follow up back with Dr Cleophas DunkerWhitfield.   Recheck with me as needed.

## 2018-08-14 ENCOUNTER — Telehealth: Payer: Self-pay | Admitting: Family Medicine

## 2018-08-14 NOTE — Progress Notes (Signed)
Joanne Winters is a 52 y.o. female who presents to [facility] today for left knee pain and swelling.  Patient has had ongoing pain of the left knee which worsened recently.  She was failing conservative management and August 2 she was at a decision point and was referred to orthopedics per her request.  MRI was obtained on August 26 which showed a osteochondral fracture at the medial femoral condyle.  Unfortunately she notes that on Saturday, August 24 she had fallen to the ground on her left knee and injured it.  She notes that since then her knee pain is been worse.  She has not yet been contacted by orthopedic group.  She notes pain in her knee is worse with ambulation.  She notes pain in the medial aspect of the knee predominantly.  Additionally she notes new onset of significant lower left lower leg swelling.  She notes her calf and her left leg is tight and painful.  She does note that she has been moving less than usual because of her knee pain and leg swelling.  She denies any pain radiating to her foot.  No fevers or chills.  No chest pain or shortness of breath.    ROS:  As above  Exam:  BP (!) 148/87   Pulse 84   Ht 5\' 3"  (1.6 m)   Wt 223 lb (101.2 kg)   SpO2 98%   BMI 39.50 kg/m  General: Well Developed, well nourished, and in no acute distress.  Neuro/Psych: Alert and oriented x3, extra-ocular muscles intact, able to move all 4 extremities, sensation grossly intact. Skin: Warm and dry, no rashes noted.  Respiratory: Not using accessory muscles, speaking in full sentences, trachea midline.  Cardiovascular: Pulses palpable, no extremity edema. Abdomen: Does not appear distended. MSK: Left knee: Moderate effusion without erythema.  Minimally tender to palpation at the medial joint line.  Normal knee motion.  Pain with MCL stress test.  No significant laxity on MCL stress testing.  Left calf slightly swollen larger calf diameter compared  to right calf.  Tender to palpation along the posterior calf with no palpable cords.  Calf squeeze test is positive.    Lab and Radiology Results No results found for this or any previous visit (from the past 72 hour(s)). Mr Knee Left W/o Contrast  Result Date: 08/12/2018 EXAM: MRI OF THE LEFT KNEE WITHOUT CONTRAST TECHNIQUE: Multiplanar, multisequence MR imaging of the knee was performed. No intravenous contrast was administered. COMPARISON:  None. FINDINGS: MENISCI Medial meniscus: Mucoid degeneration of the medial meniscus with inferior articular surfacing tear of the body, series 6/16. Lateral meniscus: Signal intensity surfacing both superiorly and inferiorly compatible with a complex tear of the body of the lateral meniscus, series 6/16, involving the middle third. LIGAMENTS Cruciates: Slight posterior convex bowing of a slightly thickened ACL compatible with a grade 1 sprain. Collaterals: Thickened MCL with overlying edema consistent with a grade 1 sprain. Intact LCL. CARTILAGE Patellofemoral: Mild chondral thinning of the patellar facet cartilage and cartilage overlying the median ridge. Intact trochlear cartilage without focal chondral defects. Medial: Diffuse chondral thinning consistent with chondromalacia is identified of the femoral and tibial plateau cartilage within the medial femorotibial compartment up to grade 4 full-thickness. Lateral:  No chondral defect. Joint:  Trace joint effusion.  Thin suprapatellar plica. Popliteal Fossa: Edema in the expected location of a popliteal cyst. Intact popliteus. Extensor Mechanism:  Intact quadriceps, patellar tendon and MPFL. Bones: Shallow osteochondral  fracture with edema involving the medial femoral condyle posterolaterally, series 7/8 and series 6/16. This measures approximately 10 mm AP x 6 mm transverse. Other: Anterior subcutaneous soft tissue edema overlying the patella and patellar tendon. IMPRESSION: 1. Shallow osteochondral fracture with  overlying soft tissue edema involving the medial femoral condyle posterolaterally with slight flattening of the femoral condyle spanning an area that measures 10 x 6 mm AP by transverse. 2. Sprains of the MCL and ACL. 3. Chondromalacia of the medial femorotibial cartilage. 4. Inferior articular surfacing tear suggested of the body of the medial meniscus and complex tear involving the body the lateral meniscus surfacing both superiorly and inferiorly involving third. 5. Trace joint effusion. Electronically Signed   By: Tollie Eth M.D.   On: 08/12/2018 16:36   I personally (independently) visualized and performed the interpretation of the images attached in this note.     Assessment and Plan: 52 y.o. female with  Left knee pain: Patient has several findings on MRI that are concerning.  Predominantly as her medial femoral condyle osteochondral lesion.  She fell before the MRI was obtained which likely explains her worsening pain.  The osteochondral lesion is probably an older finding and not as result of the fall.  She also on MRI has MCL sprain which likely happened with the fall and likely explains her worsening medial knee pain.  She does also have meniscus tear both medially and laterally as well.  For knee pain will use a hinged knee brace to help stabilize the knee from the MCL sprain.  Additionally will provide crutches and recommend nonweightbearing for the osteochondral lesion.  I anticipate that when she follows back with orthopedic surgery in the near future there will be recommendation for arthroscopic surgery to debride the meniscus tears as well as potential microfracture or oats surgery for the osteochondral lesion of the medial femoral condyle.  We will inform orthopedic surgery as well.  As for the calf swelling this is concerning for DVT.  Plan for venous ultrasound to evaluate for DVT.  This test will likely be performed in the near future.  If positive will prescribe Eliquis or  Xarelto.    Orders Placed This Encounter  Procedures  . US Venous Img Lower Unilateral Left    Standing Status:   Future    Standing Expiration Date:   10/14/2019    Order Specific Question:   Reason for Exam (SYMPTOM  OR DIAGNOSIS REQUIRED)    Answer:   leg swelling    Order Specific Question:   Preferred imaging location?    Answer:   [facility]   No orders of the defined types were placed in this encounter.   Historical information moved to improve visibility of documentation.  Past Medical History:  Diagnosis Date  . Essential hypertension 12/06/2015  . GERD (gastroesophageal reflux disease)   . Morbid obesity (HCC) 12/22/2014  . Seasonal allergies    Past Surgical History:  Procedure Laterality Date  . ABLATION  2013  . CESAREAN SECTION    . gall stone  1995   Social History   Tobacco Use  . Smoking status: Never Smoker  . Smokeless tobacco: Never Used  Substance Use Topics  . Alcohol use: Yes    Alcohol/week: 0.0 standard drinks    Comment: occ   family history includes Cancer in her maternal grandmother and paternal grandmother; Cancer - Colon in her father; Hypertension in her daughter, father, maternal grandmother, mother, and paternal grandmother.  Medications:  Current Outpatient Medications  Medication Sig Dispense Refill  . albuterol (PROAIR HFA) 108 (90 Base) MCG/ACT inhaler Inhale 1-2 puffs into the lungs every 6 (six) hours as needed for wheezing or shortness of breath. 1 Inhaler 2  . ALPRAZolam (XANAX) 0.5 MG tablet Take 1-2 tablets as needed for flying and travel. 20 tablet 0  . diclofenac (VOLTAREN) 75 MG EC tablet TAKE 1 TABLET BY MOUTH TWICE A DAY 60 tablet 1  . hydrochlorothiazide (HYDRODIURIL) 25 MG tablet TAKE 1 TABLET BY MOUTH  EVERY MORNING FOR BLOOD  PRESSURE CONTROL 90 tablet 1  . Lorcaserin HCl ER (BELVIQ XR) 20 MG TB24 Take 1 tablet by mouth daily. 30 tablet 1  . montelukast (SINGULAIR) 10 MG tablet Take 1 tablet (10 mg total)  by mouth at bedtime. 90 tablet 4  . pantoprazole (PROTONIX) 40 MG tablet Take 1 tablet (40 mg total) by mouth daily. 90 tablet 3   No current facility-administered medications for this visit.    Allergies  Allergen Reactions  . Hydrocodone Other (See Comments)  . Tramadol     nausea      Discussed warning signs or symptoms. Please see discharge instructions. Patient expresses understanding.

## 2018-08-15 ENCOUNTER — Ambulatory Visit: Payer: 59

## 2018-08-15 ENCOUNTER — Encounter (INDEPENDENT_AMBULATORY_CARE_PROVIDER_SITE_OTHER): Payer: Self-pay | Admitting: Orthopaedic Surgery

## 2018-08-15 ENCOUNTER — Ambulatory Visit (INDEPENDENT_AMBULATORY_CARE_PROVIDER_SITE_OTHER): Payer: 59 | Admitting: Orthopaedic Surgery

## 2018-08-15 VITALS — BP 154/88 | HR 82 | Ht 63.0 in | Wt 223.0 lb

## 2018-08-15 DIAGNOSIS — M25562 Pain in left knee: Secondary | ICD-10-CM | POA: Diagnosis not present

## 2018-08-15 DIAGNOSIS — M7989 Other specified soft tissue disorders: Secondary | ICD-10-CM

## 2018-08-15 DIAGNOSIS — I824Z2 Acute embolism and thrombosis of unspecified deep veins of left distal lower extremity: Secondary | ICD-10-CM

## 2018-08-15 NOTE — Progress Notes (Signed)
Office Visit Note   Patient: Joanne Winters           Date of Birth: 1966/09/13           MRN: [id number] Visit Date: 08/15/2018              Requested by: Jomarie Longs, PA-C [id number] [place], [place] [address] PCP: Jomarie Longs, PA-C   Assessment & Plan: Visit Diagnoses:  1. Acute pain of left knee     Plan: MRI scan of left knee was obtained this past Saturday.  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  There were some areas of grade 4 changes medially.  There was also an inferior articular surface tear involving the body of the medial meniscus complex tear involving the body of the lateral meniscus.  There was a shallow osteochondral fracture with overlying soft tissue edema involving the medial femoral condyle posterior laterally with slight flattening of the femoral condyle spanning an area that is about 10 x 6 mm.  There was a mild strain of the medial collateral ligament.  She actually had a fall the day before the MRI scan pain.  Long discussion over 30 minutes regarding her problem I would suggest a knee arthroscopy specifically to evaluate the menisci.  I have discussed the chondromalacic changes in her knee and the fact that we may or may not be able to make any difference.  She may very well have chronic pain based on the arthritis.  May alter her ability to work at a job standing 10 hours a day.  She is fully aware of the potential limitations of the arthroscopy but would like to proceed.  We will set that up as soon as possible allow her to have discussed the surgery the outpatient nature of the arthroscopic portals potential complications and limitations  Follow-Up Instructions: Return will schedule surgery.   Orders:  No orders of the defined types were placed in this encounter.  No orders of the defined types were placed in this encounter.     Procedures: No procedures  performed   Clinical Data: No additional findings.   Subjective: Chief Complaint  Patient presents with  . Follow-up    MRI L KNEE REVIEW, FELL OUT OF TRUCK 5 DAYS AGO. KNEE HAD SWELLING  Persistent pain left knee as previously outlined in the office notes.  No response to intra-articular cortisone injection thus prompting the MRI scan.  Results are as above.  Brace was provided by her primary care physician that apparently was not helping  HPI  Review of Systems  Constitutional: Negative for fatigue and fever.  HENT: Negative for ear pain.   Eyes: Negative for pain.  Respiratory: Negative for cough and shortness of breath.   Cardiovascular: Positive for leg swelling.  Gastrointestinal: Negative for constipation and diarrhea.  Genitourinary: Negative for difficulty urinating.  Musculoskeletal: Negative for back pain and neck pain.  Skin: Negative for rash.  Allergic/Immunologic: Negative for food allergies.  Neurological: Positive for weakness and numbness.  Hematological: Does not bruise/bleed easily.  Psychiatric/Behavioral: Positive for sleep disturbance.     Objective: Vital Signs: BP (!) 154/88 (BP Location: Left Arm, Patient Position: Sitting, Cuff Size: Normal)   Pulse 82   Ht 5\' 3"  (1.6 m)   Wt 223 lb (101.2 kg)   BMI 39.50 kg/m   Physical Exam  Constitutional: She is oriented to person, place, and time. She appears well-developed  and well-nourished.  HENT:  Mouth/Throat: Oropharynx is clear and moist.  Eyes: Pupils are equal, round, and reactive to light. EOM are normal.  Pulmonary/Chest: Effort normal.  Neurological: She is alert and oriented to person, place, and time.  Skin: Skin is warm and dry.  Psychiatric: She has a normal mood and affect. Her behavior is normal.    Ortho Exam awake alert and oriented x3.  Comfortable sitting.  Pain with patella motion with some crepitation.  This certainly would be consistent with her chondromalacia patella.  Some  medial and lateral joint pain.  Full extension.  Minimal effusion.  Flexion about 103 to 4 degrees.  Did have some medial lateral joint pain but without crepitation popliteal pain.  No calf pain.  No distal edema.  No evidence of any instability and specifically the MCL.  Negative anterior drawer sign  Specialty Comments:  No specialty comments available.  Imaging: US Venous Img Lower Unilateral Left  Result Date: 08/15/2018 CLINICAL DATA:  52 year old female with progressive left lower extremity pain and swelling after falling 5 days ago. EXAM: LEFT LOWER EXTREMITY VENOUS DOPPLER ULTRASOUND TECHNIQUE: Gray-scale sonography with graded compression, as well as color Doppler and duplex ultrasound were performed to evaluate the lower extremity deep venous systems from the level of the common femoral vein and including the common femoral, femoral, profunda femoral, popliteal and calf veins including the posterior tibial, peroneal and gastrocnemius veins when visible. The superficial great saphenous vein was also interrogated. Spectral Doppler was utilized to evaluate flow at rest and with distal augmentation maneuvers in the common femoral, femoral and popliteal veins. COMPARISON:  None. FINDINGS: Contralateral Common Femoral Vein: Respiratory phasicity is normal and symmetric with the symptomatic side. No evidence of thrombus. Normal compressibility. Common Femoral Vein: No evidence of thrombus. Normal compressibility, respiratory phasicity and response to augmentation. Saphenofemoral Junction: No evidence of thrombus. Normal compressibility and flow on color Doppler imaging. Profunda Femoral Vein: No evidence of thrombus. Normal compressibility and flow on color Doppler imaging. Femoral Vein: No evidence of thrombus. Normal compressibility, respiratory phasicity and response to augmentation. Popliteal Vein: No evidence of thrombus. Normal compressibility, respiratory phasicity and response to augmentation.  Calf Veins: No evidence of thrombus within the proximal calf veins. However, at the level of the ankle, 1 of the paired posterior tibial veins does not compress and is expanded with low-level internal echoes. No evidence of flow on color Doppler imaging. Superficial Great Saphenous Vein: No evidence of thrombus. Normal compressibility. Venous Reflux:  None. Other Findings:  None. IMPRESSION: Positive for isolated calf DVT involving 1 of the paired posterior tibial veins at the ankle. These results will be called to the ordering clinician or representative by the Radiologist Assistant, and communication documented in the PACS or zVision Dashboard. Electronically Signed   By: Malachy Moan M.D.   On: 08/15/2018 10:43     PMFS History: Patient Active Problem List   Diagnosis Date Noted  . Baker's cyst of knee, left 06/05/2018  . DDD (degenerative disc disease), lumbar 05/27/2018  . Cough 02/13/2018  . Plantar fasciitis, left 09/11/2017  . Hoarseness or changing voice 09/11/2017  . Left knee pain 07/11/2017  . Multiple thyroid nodules 02/28/2017  . 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 Neuropathy 10/20/2016  . De Quervain's tenosynovitis, right 10/20/2016  . 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 Motion sickness 07/16/2016  . Skin tag 07/16/2016  . PUD (peptic ulcer disease) 12/15/2015  . Essential hypertension 12/06/2015  . Plantar wart 05/12/2015  .  Thyroid goiter 12/22/2014  . GERD (gastroesophageal reflux disease) 10/07/2014  . Seasonal allergies 10/07/2014  . Environmental allergies 10/07/2014  . Morbid obesity (HCC) 10/07/2014   Past Medical History:  Diagnosis Date  . Essential hypertension 12/06/2015  . GERD (gastroesophageal reflux disease)   . Morbid obesity (HCC) 12/22/2014  . Seasonal allergies     Family History  Problem Relation Age of Onset  . Hypertension Mother   . Hypertension Father   . Cancer - Colon Father   . Hypertension Daughter   . Cancer Maternal Grandmother         breast  . Hypertension Maternal Grandmother   . Cancer Paternal Grandmother        breast  . Hypertension Paternal Grandmother     Past Surgical History:  Procedure Laterality Date  . ABLATION  2013  . CESAREAN SECTION    . gall stone  1995   Social History   Occupational History  . Not on file  Tobacco Use  . Smoking status: Never Smoker  . Smokeless tobacco: Never Used  Substance and Sexual Activity  . Alcohol use: Yes    Alcohol/week: 0.0 standard drinks    Comment: occ  . Drug use: No  . Sexual activity: Not on file

## 2018-08-16 ENCOUNTER — Telehealth: Payer: Self-pay | Admitting: Family Medicine

## 2018-08-16 DIAGNOSIS — I82409 Acute embolism and thrombosis of unspecified deep veins of unspecified lower extremity: Secondary | ICD-10-CM | POA: Insufficient documentation

## 2018-08-16 MED ORDER — APIXABAN 5 MG PO TABS: ORAL_TABLET | ORAL | 0 refills | Status: DC

## 2018-08-16 NOTE — Telephone Encounter (Signed)
FMLA form received.  Filled out to the best of my ability and will fax back.  Will send copy to scan.  Fundamentally patient is now being prepared for surgery and work restrictions and expected return to work date will likely be determined by orthopedic surgery.  Request FMLA form to be sent to orthopedic surgery as well for further details.

## 2018-08-20 NOTE — Telephone Encounter (Signed)
No she would have to come off of it for surgery and due to the acute DVT so recently I would not advise coming off for at least 3-6 months.

## 2018-08-20 NOTE — Telephone Encounter (Signed)
Can patient still have left knee surgery now that she is on a blood thinner for DVT of left leg. Please advise.

## 2018-08-21 ENCOUNTER — Ambulatory Visit: Payer: 59 | Admitting: Physician Assistant

## 2018-08-21 ENCOUNTER — Encounter: Payer: Self-pay | Admitting: Physician Assistant

## 2018-08-21 ENCOUNTER — Ambulatory Visit (INDEPENDENT_AMBULATORY_CARE_PROVIDER_SITE_OTHER): Payer: 59

## 2018-08-21 VITALS — BP 127/74 | HR 85 | Ht 63.0 in | Wt 220.0 lb

## 2018-08-21 DIAGNOSIS — M25511 Pain in right shoulder: Secondary | ICD-10-CM | POA: Diagnosis not present

## 2018-08-21 DIAGNOSIS — M19011 Primary osteoarthritis, right shoulder: Secondary | ICD-10-CM | POA: Diagnosis not present

## 2018-08-21 DIAGNOSIS — W19XXXD Unspecified fall, subsequent encounter: Secondary | ICD-10-CM

## 2018-08-21 MED ORDER — PREDNISONE 50 MG PO TABS: ORAL_TABLET | ORAL | 0 refills | Status: DC

## 2018-08-21 MED ORDER — OXYCODONE-ACETAMINOPHEN 5-325 MG PO TABS: 1.0000 | ORAL_TABLET | Freq: Four times a day (QID) | ORAL | 0 refills | Status: DC | PRN

## 2018-08-21 NOTE — Progress Notes (Signed)
Subjective:    Patient ID: Joanne Winters, female    DOB: 1966-01-25, 52 y.o.   MRN: [id number]  HPI Pt is a 52 yo with HTN,  recent DVT and fracture of left leg who presents to the clinic with right arm pain to be evaluated. Pain started same day of fall on 08/10/18 but due to lower extremity being more painful she ignored.  Pain is worse with lifting and movement. pain radiates from right anterior shoulder down biceps of right arm. no lower arm pain. She has tried to do some stretches but pain persists. Denies any neck pain. She did fall on the right side. ROM is decreased due to pain. Not done much else for treatment.   .. Active Ambulatory Problems    Diagnosis Date Noted  . GERD (gastroesophageal reflux disease) 10/07/2014  . Seasonal allergies 10/07/2014  . Environmental allergies 10/07/2014  . Morbid obesity (HCC) 10/07/2014  . Thyroid goiter 12/22/2014  . Plantar wart 05/12/2015  . Essential hypertension 12/06/2015  . PUD (peptic ulcer disease) 12/15/2015  . 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 Motion sickness 07/16/2016  . Skin tag 07/16/2016  . Neuropathy 10/20/2016  . De Quervain's tenosynovitis, right 10/20/2016  . 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 Multiple thyroid nodules 02/28/2017  . Left knee pain 07/11/2017  . Plantar fasciitis, left 09/11/2017  . Hoarseness or changing voice 09/11/2017  . Cough 02/13/2018  . DDD (degenerative disc disease), lumbar 05/27/2018  . Baker's cyst of knee, left 06/05/2018  . DVT (deep venous thrombosis) (HCC) 08/16/2018  . Fall 08/25/2018  . Acute pain of right shoulder 08/25/2018   Resolved Ambulatory Problems    Diagnosis Date Noted  . Lateral epicondylitis of right elbow 12/22/2014  . Morbid obesity (HCC) 12/22/2014  . Abnormal weight gain 03/01/2015  . Right calf pain 04/26/2015  . Elevated blood pressure 04/26/2015  . Pain in lower limb 05/26/2015  . Heel spur, right 09/11/2017  . Throat tightness 09/11/2017  . Spasm of muscle, back  02/28/2018  . Hamstring strain, left, subsequent encounter 05/24/2018   No Additional Past Medical History     Review of Systems  All other systems reviewed and are negative.      Objective:   Physical Exam  Constitutional: She is oriented to person, place, and time. She appears well-developed and well-nourished.  HENT:  Head: Normocephalic and atraumatic.  Cardiovascular: Normal rate and regular rhythm.  Pulmonary/Chest: Effort normal and breath sounds normal.  Musculoskeletal:  Right arm:  Decreased ROM of right arm not able to abduct past 100 degrees.  Pain to palpation over anterior shoulder and down bicepts of right arm.  Pain with hawkins and empty can.  Strength 4/5 right arm.  No pain in bicep with resistance.   Neurological: She is alert and oriented to person, place, and time.  Psychiatric: She has a normal mood and affect. Her behavior is normal.          Assessment & Plan:  [name] KitchenMarland KitchenDiagnoses and all orders for this visit:  Acute pain of right shoulder -     DG Shoulder Right -     oxyCODONE-acetaminophen (PERCOCET/ROXICET) 5-325 MG tablet; Take 1 tablet by mouth every 6 (six) hours as needed for up to 5 days. -     predniSONE (DELTASONE) 50 MG tablet; One tab PO daily for 5 days.  Fall, subsequent encounter -     DG Shoulder Right  Xray of shoulder confirm:   1. No fracture or  acute finding. 2. Mild AC joint osteoarthritis.  5 days of oxycodone to use as needed for pain given. Pt has not been able to tolerate norco and/or tramadol in the past.  Burst of prednisone to help with inflammation of nerves. Continue on diclofenac. Use heat on area. Continue to work on stretches. If no improvement in the next 2 weeks will need to get MRI.

## 2018-08-21 NOTE — Telephone Encounter (Signed)
Patient advised of recommendations.  

## 2018-08-21 NOTE — Progress Notes (Signed)
Call pt: osteoarthritis noted in shoulder joint but no acute findings. I will give a burst of prednisone. If not improving in 1 week let me know. Continue with heat and gentle stretches.

## 2018-08-23 ENCOUNTER — Telehealth (INDEPENDENT_AMBULATORY_CARE_PROVIDER_SITE_OTHER): Payer: Self-pay | Admitting: Orthopaedic Surgery

## 2018-08-23 ENCOUNTER — Other Ambulatory Visit: Payer: Self-pay | Admitting: Physician Assistant

## 2018-08-23 ENCOUNTER — Ambulatory Visit (INDEPENDENT_AMBULATORY_CARE_PROVIDER_SITE_OTHER): Payer: 59 | Admitting: Orthopaedic Surgery

## 2018-08-23 DIAGNOSIS — K2100 Gastro-esophageal reflux disease with esophagitis, without bleeding: Secondary | ICD-10-CM

## 2018-08-23 NOTE — Telephone Encounter (Signed)
JUST AN FYI 

## 2018-08-23 NOTE — Telephone Encounter (Signed)
I called patient to let her know that her surgery will need to be scheduled after she has been on the new Rx for DVT for at least 3 month, and possible 6 months.  She will continue to follow up with her doctor and one clearance is provided after that time frame we will schedule.  Patient has my name and direct number for scheduling.

## 2018-08-25 DIAGNOSIS — W19XXXA Unspecified fall, initial encounter: Secondary | ICD-10-CM | POA: Insufficient documentation

## 2018-08-25 DIAGNOSIS — M25511 Pain in right shoulder: Secondary | ICD-10-CM | POA: Insufficient documentation

## 2018-08-30 ENCOUNTER — Ambulatory Visit: Payer: 59 | Admitting: Physician Assistant

## 2018-08-30 ENCOUNTER — Encounter: Payer: Self-pay | Admitting: Physician Assistant

## 2018-08-30 VITALS — BP 144/64 | HR 84 | Ht 63.0 in | Wt 225.0 lb

## 2018-08-30 DIAGNOSIS — M25511 Pain in right shoulder: Secondary | ICD-10-CM

## 2018-08-30 DIAGNOSIS — S72045D Nondisplaced fracture of base of neck of left femur, subsequent encounter for closed fracture with routine healing: Secondary | ICD-10-CM

## 2018-08-30 DIAGNOSIS — M19111 Post-traumatic osteoarthritis, right shoulder: Secondary | ICD-10-CM | POA: Diagnosis not present

## 2018-08-30 DIAGNOSIS — M25562 Pain in left knee: Secondary | ICD-10-CM | POA: Diagnosis not present

## 2018-08-30 DIAGNOSIS — G8929 Other chronic pain: Secondary | ICD-10-CM | POA: Insufficient documentation

## 2018-08-30 DIAGNOSIS — S72045A Nondisplaced fracture of base of neck of left femur, initial encounter for closed fracture: Secondary | ICD-10-CM | POA: Insufficient documentation

## 2018-08-30 DIAGNOSIS — I82492 Acute embolism and thrombosis of other specified deep vein of left lower extremity: Secondary | ICD-10-CM | POA: Diagnosis not present

## 2018-08-30 MED ORDER — DICLOFENAC SODIUM 1 % TD GEL: 4.0000 g | Freq: Four times a day (QID) | TRANSDERMAL | 1 refills | Status: DC

## 2018-08-30 NOTE — Progress Notes (Signed)
l °

## 2018-09-01 ENCOUNTER — Encounter: Payer: Self-pay | Admitting: Physician Assistant

## 2018-09-02 ENCOUNTER — Encounter: Payer: Self-pay | Admitting: Physician Assistant

## 2018-09-02 NOTE — Progress Notes (Signed)
Subjective:    Patient ID: Joanne Winters, female    DOB: 1966/10/20, 52 y.o.   MRN: [id number]  HPI Pt is a 52 yo female with recent detected chronic fracture of left femur, left DVT, left torn mensicus who presents to the clinic to fill out paperwork to go back to work until knee surgery can be done. Her left leg swelling as resolved. She is on eliquis with no complications. She continues to have knee pain which she needs surgery for. Unknown when surgeon will schedule. She feels like she can do most of her job just might need a 50 percent standing/50 percent sitting accomodation.   She continues to have right shoulder pain. Xray did show some OA changes.   .. Active Ambulatory Problems    Diagnosis Date Noted  . GERD (gastroesophageal reflux disease) 10/07/2014  . Seasonal allergies 10/07/2014  . Environmental allergies 10/07/2014  . Morbid obesity (HCC) 10/07/2014  . Thyroid goiter 12/22/2014  . Plantar wart 05/12/2015  . Essential hypertension 12/06/2015  . PUD (peptic ulcer disease) 12/15/2015  . 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 Motion sickness 07/16/2016  . Skin tag 07/16/2016  . Neuropathy 10/20/2016  . De Quervain's tenosynovitis, right 10/20/2016  . 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 Multiple thyroid nodules 02/28/2017  . Left knee pain 07/11/2017  . Plantar fasciitis, left 09/11/2017  . Hoarseness or changing voice 09/11/2017  . Cough 02/13/2018  . DDD (degenerative disc disease), lumbar 05/27/2018  . Baker's cyst of knee, left 06/05/2018  . DVT (deep venous thrombosis) (HCC) 08/16/2018  . Fall 08/25/2018  . Acute pain of right shoulder 08/25/2018  . 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  . Chronic pain of left knee 08/30/2018  . Closed nondisplaced fracture of base of neck of left femur (HCC) 08/30/2018   Resolved Ambulatory Problems    Diagnosis Date Noted  . Lateral epicondylitis of right elbow 12/22/2014  . Morbid obesity (HCC) 12/22/2014  .  Abnormal weight gain 03/01/2015  . Right calf pain 04/26/2015  . Elevated blood pressure 04/26/2015  . Pain in lower limb 05/26/2015  . Heel spur, right 09/11/2017  . Throat tightness 09/11/2017  . Spasm of muscle, back 02/28/2018  . Hamstring strain, left, subsequent encounter 05/24/2018   No Additional Past Medical History     Review of Systems See HPI.     Objective:   Physical Exam  Constitutional: She appears well-developed and well-nourished.  Cardiovascular: Normal rate and regular rhythm.  Skin:  No leg swelling, bruising, redness, warmth, or tenderness to palpation.   Psychiatric: She has a normal mood and affect. Her behavior is normal.          Assessment & Plan:  [name] KitchenMarland KitchenDiagnoses and all orders for this visit:  Acute deep vein thrombosis (DVT) of other specified vein of left lower extremity (HCC)  Acute pain of right shoulder -     diclofenac sodium (VOLTAREN) 1 % GEL; Apply 4 g topically 4 (four) times daily. To affected joint.  Post-traumatic osteoarthritis of right shoulder -     diclofenac sodium (VOLTAREN) 1 % GEL; Apply 4 g topically 4 (four) times daily. To affected joint.  Chronic pain of left knee -     diclofenac sodium (VOLTAREN) 1 % GEL; Apply 4 g topically 4 (four) times daily. To affected joint.  Closed nondisplaced basicervical fracture of left femur with routine healing, subsequent encounter   Filled out FMLA paperwork for patient with some restrictions. ok'd to go back  to work 09/04/18 until her left knee surgery. She will need some accommodations due to chronic knee pain. I do need to reach out to surgeon and discuss when he feels comfortable doing surgery. She will need to be off eliquis for a few days prior to surgery. I think after 1 month it would be reasonable to consider surgery.   Shoulder pain did not occur until after the fall. OA seen on xray. Suspect some impingement syndrome. Discussed PT. Pt declined right now. Gave  stretches/exercises in office. Discussed ice and NSAIDs. voltaren given to try. Follow up as needed or with sports medicine to consider injections in the shoulder joint. We need to likely hold off on injections until after knee surgery.   [name] Kitchen..Spent 30 minutes with patient and greater than 50 percent of visit spent counseling patient regarding treatment plan.

## 2018-09-03 ENCOUNTER — Encounter: Payer: Self-pay | Admitting: Physician Assistant

## 2018-09-04 ENCOUNTER — Inpatient Hospital Stay (INDEPENDENT_AMBULATORY_CARE_PROVIDER_SITE_OTHER): Payer: 59 | Admitting: Orthopedic Surgery

## 2018-09-06 ENCOUNTER — Encounter: Payer: Self-pay | Admitting: Physician Assistant

## 2018-09-07 ENCOUNTER — Other Ambulatory Visit: Payer: Self-pay | Admitting: Family Medicine

## 2018-09-09 ENCOUNTER — Encounter: Payer: Self-pay | Admitting: Physician Assistant

## 2018-09-09 NOTE — Telephone Encounter (Signed)
I sent Dr. Cleophas DunkerWhitfield pt's chart and my suggestion that if he felt comfortable we could do surgery after 30 days on eliqus. He has not responded. Can we leave another msg"

## 2018-09-12 ENCOUNTER — Ambulatory Visit: Payer: 59 | Admitting: Physician Assistant

## 2018-09-12 ENCOUNTER — Encounter: Payer: Self-pay | Admitting: Physician Assistant

## 2018-09-12 VITALS — BP 145/79 | HR 93 | Ht 63.0 in | Wt 224.0 lb

## 2018-09-12 DIAGNOSIS — I824Z2 Acute embolism and thrombosis of unspecified deep veins of left distal lower extremity: Secondary | ICD-10-CM

## 2018-09-12 DIAGNOSIS — M25562 Pain in left knee: Secondary | ICD-10-CM

## 2018-09-12 DIAGNOSIS — M25511 Pain in right shoulder: Secondary | ICD-10-CM | POA: Diagnosis not present

## 2018-09-12 DIAGNOSIS — M19011 Primary osteoarthritis, right shoulder: Secondary | ICD-10-CM

## 2018-09-12 DIAGNOSIS — G8929 Other chronic pain: Secondary | ICD-10-CM

## 2018-09-12 NOTE — Progress Notes (Signed)
Subjective:    Patient ID: Joanne Winters, female    DOB: 03-Jun-1966, 52 y.o.   MRN: [id number]  HPI  Pt is a 52 yo female with recent DVT who presents to the clinic to discuss ongoing left knee pain and acute right shoulder pain.   I allowed her to try going back to work with some restrictions. She has not been able to do that. The standing and sitting has made her left knee pain unbearable. She is supposed to have surgery but delayed due to DVT. She is on eliqus. She is now walking with crutches.   Her right shoulder pain started after fall. Xray showed arthritis. She cannot do NSAIDs right now. She tried some home exercies but not helping.   .. Active Ambulatory Problems    Diagnosis Date Noted  . GERD (gastroesophageal reflux disease) 10/07/2014  . Seasonal allergies 10/07/2014  . Environmental allergies 10/07/2014  . Morbid obesity (HCC) 10/07/2014  . Thyroid goiter 12/22/2014  . Plantar wart 05/12/2015  . Essential hypertension 12/06/2015  . PUD (peptic ulcer disease) 12/15/2015  . 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 Motion sickness 07/16/2016  . Skin tag 07/16/2016  . Neuropathy 10/20/2016  . De Quervain's tenosynovitis, right 10/20/2016  . 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 Multiple thyroid nodules 02/28/2017  . Left knee pain 07/11/2017  . Plantar fasciitis, left 09/11/2017  . Hoarseness or changing voice 09/11/2017  . Cough 02/13/2018  . DDD (degenerative disc disease), lumbar 05/27/2018  . Baker's cyst of knee, left 06/05/2018  . DVT (deep venous thrombosis) (HCC) 08/16/2018  . Fall 08/25/2018  . Acute pain of right shoulder 08/25/2018  . 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  . Chronic pain of left knee 08/30/2018  . Closed nondisplaced fracture of base of neck of left femur (HCC) 08/30/2018   Resolved Ambulatory Problems    Diagnosis Date Noted  . Lateral epicondylitis of right elbow 12/22/2014  . Morbid obesity (HCC) 12/22/2014  . Abnormal weight gain  03/01/2015  . Right calf pain 04/26/2015  . Elevated blood pressure 04/26/2015  . Pain in lower limb 05/26/2015  . Heel spur, right 09/11/2017  . Throat tightness 09/11/2017  . Spasm of muscle, back 02/28/2018  . Hamstring strain, left, subsequent encounter 05/24/2018   No Additional Past Medical History        Review of Systems See HPI.     Objective:   Physical Exam  Constitutional: She is oriented to person, place, and time. She appears well-developed and well-nourished.  HENT:  Head: Normocephalic and atraumatic.  Cardiovascular: Normal rate and regular rhythm.  Pulmonary/Chest: Effort normal and breath sounds normal.  Musculoskeletal:  Right shoulder:  Pain with abduction. Positive hawkins.  4/5 strength of right upper ext.  Positive empty can.   Neurological: She is alert and oriented to person, place, and time.  Psychiatric: She has a normal mood and affect. Her behavior is normal.          Assessment & Plan:  [name] KitchenMarland KitchenDiagnoses and all orders for this visit:  Acute pain of right shoulder  Chronic pain of left knee  Acute deep vein thrombosis (DVT) of distal vein of left lower extremity (HCC)  I know she wants to get her knee surgery out of the way but we have to be safe about this. General guidelines are to wait 3 months after starting eliquis. Will discussed with orthopedic Dr. Lessie Dings and get his thoughts.   Shoulder pain likely represents some impingement syndrome.  Exercises at home have not helped. At this time pain management is hard. We do not want to give steroid injections if surgery is in the near future. Due to anticoagulants we need to avoid NSAID use orally. Can use diclofenac gel. Will get some formal PT. Cannot tolerate tramadol or norco. She has a few oxycotin at the house for severe pain. Discussed to use with caution due to dependence risk. Can use tylenol 1000mg  3 to 4 times a day.   Agreed that patient cannot work until knee surgery. Will  fill out any paper work needed.

## 2018-09-12 NOTE — Telephone Encounter (Signed)
Call pt: Dr. Cleophas Dunker responded. He does not feel comfortable operating until on eliqus for 3 months. Schedule appt in 3 months to discuss scheduling surgery.

## 2018-09-12 NOTE — Patient Instructions (Addendum)
Tylenol 1000mg  three times day.   Will get PT for right shoulder.

## 2018-09-13 NOTE — Telephone Encounter (Signed)
Pt advised. She questions if she can go back to work since it will be 3 months? She would like the restriction of being able to sit down every 2 hours. Routing.   Pt aware PCP is out today, she is OK to wait until Monday for response.

## 2018-09-15 ENCOUNTER — Encounter: Payer: Self-pay | Admitting: Physician Assistant

## 2018-09-16 ENCOUNTER — Encounter: Payer: Self-pay | Admitting: Physician Assistant

## 2018-09-16 ENCOUNTER — Other Ambulatory Visit: Payer: Self-pay | Admitting: Physician Assistant

## 2018-09-16 DIAGNOSIS — S76312D Strain of muscle, fascia and tendon of the posterior muscle group at thigh level, left thigh, subsequent encounter: Secondary | ICD-10-CM

## 2018-09-16 DIAGNOSIS — M19011 Primary osteoarthritis, right shoulder: Secondary | ICD-10-CM | POA: Insufficient documentation

## 2018-09-16 DIAGNOSIS — G8929 Other chronic pain: Secondary | ICD-10-CM

## 2018-09-16 DIAGNOSIS — M25562 Pain in left knee: Principal | ICD-10-CM

## 2018-09-16 DIAGNOSIS — M722 Plantar fascial fibromatosis: Secondary | ICD-10-CM

## 2018-09-17 ENCOUNTER — Encounter: Payer: Self-pay | Admitting: Physician Assistant

## 2018-09-18 ENCOUNTER — Encounter: Payer: Self-pay | Admitting: Physician Assistant

## 2018-09-24 ENCOUNTER — Encounter: Payer: Self-pay | Admitting: Rehabilitative and Restorative Service Providers"

## 2018-09-24 ENCOUNTER — Ambulatory Visit (INDEPENDENT_AMBULATORY_CARE_PROVIDER_SITE_OTHER): Payer: 59 | Admitting: Rehabilitative and Restorative Service Providers"

## 2018-09-24 DIAGNOSIS — M25511 Pain in right shoulder: Secondary | ICD-10-CM

## 2018-09-24 DIAGNOSIS — R293 Abnormal posture: Secondary | ICD-10-CM | POA: Diagnosis not present

## 2018-09-24 DIAGNOSIS — R29898 Other symptoms and signs involving the musculoskeletal system: Secondary | ICD-10-CM | POA: Diagnosis not present

## 2018-09-24 NOTE — Therapy (Signed)
[facility] [place] [address] [place], [place], [address] Phone: [phone]   Fax:  [phone]  Physical Therapy Evaluation  Patient Details  Name: Joanne Winters MRN: [id number] Date of Birth: [birth year] Referring Provider (PT): Tandy Gaw, [place]   Encounter Date: 09/24/2018  PT End of Session - 09/24/18 1257    Visit Number  1    Number of Visits  12    Date for PT Re-Evaluation  11/05/18    PT Start Time  1100    PT Stop Time  1159    PT Time Calculation (min)  59 min    Activity Tolerance  Patient tolerated treatment well       Past Medical History:  Diagnosis Date  . Essential hypertension 12/06/2015  . GERD (gastroesophageal reflux disease)   . Morbid obesity (HCC) 12/22/2014  . Seasonal allergies     Past Surgical History:  Procedure Laterality Date  . ABLATION  2013  . CESAREAN SECTION    . gall stone  1995    There were no vitals filed for this visit.   Subjective Assessment - 09/24/18 1110    Subjective  Patient reports that she fell out of a truck sustaining injury to Rt shoulder and Lt knee. She developed a blood clot in the Lt LE and has a torn meniscus. She fell to the Rt out of the truck landing on the Rt shoulder.     Pertinent History  arthritis; gall stone removed; c-section x 2 Lt meniscus surgery scheduled 11/28/18    Diagnostic tests  xrays - arthritis Rt shoulder     Patient Stated Goals  get rid of the shoulder pain and help her sleep better     Currently in Pain?  Yes    Pain Score  9     Pain Location  Shoulder    Pain Orientation  Right    Pain Descriptors / Indicators  Throbbing;Sharp    Pain Type  Acute pain    Pain Radiating Towards  down into the Rt arm to mid  flexor forearm     Pain Onset  More than a month ago    Pain Frequency  Intermittent    Aggravating Factors   lying down to sleep at night; rest after using arm or exercising; raising arm overhead; lifting     Pain  Relieving Factors  ice; meds help a little bit          [facility] PT Assessment - 09/24/18 0001      Assessment   Medical Diagnosis  Rt shoulder dysfunction     Referring Provider (PT)  Tandy Gaw, PA-C    Onset Date/Surgical Date  08/10/18    Hand Dominance  Right    Next MD Visit  PRN    Prior Therapy  no       Precautions   Precautions  None      Balance Screen   Has the patient fallen in the past 6 months  Yes    How many times?  1    Has the patient had a decrease in activity level because of a fear of falling?   No    Is the patient reluctant to leave their home because of a fear of falling?   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 - currently out of work on [employer]     Leisure  household chores; picks up grandchild from school - Medical illustrator - sedentary       Observation/Other Assessments   Focus on Therapeutic Outcomes (FOTO)   54% limitation       Sensation   Additional Comments  WFL's per pt report       Posture/Postural Control   Posture Comments  head forward; shoulders rounded and eelvated; head of the humerus anterior in orientation; scapulae abducted and rotated along the thoracic spine       AROM   Right/Left Shoulder  --   shoulder ROM assesssed in standing    Right Shoulder Extension  45 Degrees    Right Shoulder Flexion  94 Degrees   poor scapuar control - pain    Right Shoulder ABduction  68 Degrees   scapular elevation poor scapular control - pain    Right Shoulder Internal Rotation  12 Degrees   pain    Right Shoulder External Rotation  36 Degrees   pain    Left Shoulder Extension  60 Degrees    Left Shoulder Flexion  151 Degrees    Left Shoulder ABduction  150 Degrees    Left Shoulder  Internal Rotation  36 Degrees    Left Shoulder External Rotation  90 Degrees      Strength   Right/Left Shoulder  --   strength NT Rt - WNL's Lt      Palpation   Spinal mobility  hypomobile thoracic spine with CPA mobs     Palpation comment  tightness Rt shoulder girdle including pecs; upper trap; leveator; periscapular musculature                 Objective measurements completed on examination: See above findings.      [facility] Adult PT Treatment/Exercise - 09/24/18 0001      Shoulder Exercises: Standing   Other Standing Exercises  axial extension 10 sec x 5; scap squeeze 10 sec x 10 with swim noodle       Shoulder Exercises: Pulleys   Flexion  --   10 sec hold x 10 - limited by pain      Shoulder Exercises: ROM/Strengthening   Pendulum  20 CW/20 CCW       Electrical Stimulation   Electrical Stimulation Location  15    Electrical Stimulation Action  IFC    Electrical Stimulation Parameters  to tolerance    Electrical Stimulation Goals  Pain;Tone      Vasopneumatic   Number Minutes Vasopneumatic   15 minutes    Vasopnuematic Location   Shoulder   Rt   Vasopneumatic Pressure  Low    Vasopneumatic Temperature   34 deg              PT Education - 09/24/18 1153    Education Details  HEP posture TENS     Person(s) Educated  Patient    Methods  Explanation;Demonstration;Verbal cues;Tactile cues;Handout    Comprehension  Verbalized understanding;Returned demonstration;Tactile cues required          PT Long Term Goals - 09/24/18 1305      PT LONG TERM GOAL #1   Title  Decrease pain by 50-75% allowing patient to participate in therapy/HEP and sleep 4-5  hours without awakening at night 11/05/18     Time  6    Period  Weeks    Status  New      PT LONG TERM GOAL #2   Title  Increase AROM Rt shoulder to +/> Lt shoulder with minimal pain 11/05/18    Time  6    Period  Weeks    Status  New      PT LONG TERM GOAL #3   Title  Improve posture and  alignment with patient to demonstrate improved upright posture with posterior shoulder girdle engaged/improved scapular-shoulder mechanics 11/05/18    Time  6    Period  Weeks    Status  New      PT LONG TERM GOAL #4   Title  Independent in HEP 11/05/18    Time  6    Period  Weeks    Status  New      PT LONG TERM GOAL #5   Title  Improve FOTO to </= 32% limitation 11/05/18    Time  6    Period  Weeks    Status  New             Plan - 09/24/18 1259    Clinical Impression Statement  Joanne Winters presents with Rt shoulder pain and Lt knee pain following a fall from a wrecker truck 08/10/18 when she fell from the passenger side of the truck landing on her Rt shoulder. Patient has persistent pain in the Rt shoulder limiting functional activities and sleep. Patient has a torn meniscus Lt knee and DVT Lt calf with DVT currently being treated in order for Lt knee surgery to proceed - scheduled for 11/28/18. Patient presents with Rt shoulder pain with AROM and at rest. She has poor posture and alignment; scapular dyskinesis; limited ROM; decreased functional use Lt UE; inability to sleep due to pain. Patient will benefit form PT to address problems identified.     History and Personal Factors relevant to plan of care:  unremarkable per pt report     Clinical Presentation  Evolving    Clinical Presentation due to:  complicated due to Lt knee injury     Clinical Decision Making  Low    Rehab Potential  Good    PT Frequency  2x / week    PT Duration  6 weeks    PT Treatment/Interventions  Patient/family education;ADLs/Self Care Home Management;Cryotherapy;Electrical Stimulation;Iontophoresis 4mg /ml Dexamethasone;Moist Heat;Ultrasound;Dry needling;Manual techniques;Neuromuscular re-education;Therapeutic activities;Therapeutic exercise    PT Next Visit Plan  review HEP; progress with postural correction; PROM Rt shoulder; scapular stability/strengthening/movement patterns; manual work;  Armed forces operational officer; modalities as indicated     Consulted and Agree with Plan of Care  Patient       Patient will benefit from skilled therapeutic intervention in order to improve the following deficits and impairments:  Postural dysfunction, Improper body mechanics, Pain, Increased fascial restricitons, Increased muscle spasms, Hypomobility, Decreased mobility, Decreased range of motion, Decreased activity tolerance, Impaired UE functional use  Visit Diagnosis: Acute pain of right shoulder - Plan: PT plan of care cert/re-cert  Other symptoms and signs involving the musculoskeletal system - Plan: PT plan of care cert/re-cert  Abnormal posture - Plan: PT plan of care cert/re-cert     Problem List Patient Active Problem List   Diagnosis Date Noted  . Arthritis of right acromioclavicular joint 09/16/2018  . 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  . Chronic pain of left knee 08/30/2018  . Closed nondisplaced  fracture of base of neck of left femur (HCC) 08/30/2018  . Fall 08/25/2018  . Acute pain of right shoulder 08/25/2018  . DVT (deep venous thrombosis) (HCC) 08/16/2018  . Baker's cyst of knee, left 06/05/2018  . DDD (degenerative disc disease), lumbar 05/27/2018  . Cough 02/13/2018  . Plantar fasciitis, left 09/11/2017  . Hoarseness or changing voice 09/11/2017  . Left knee pain 07/11/2017  . Multiple thyroid nodules 02/28/2017  . 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 Neuropathy 10/20/2016  . De Quervain's tenosynovitis, right 10/20/2016  . 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 Motion sickness 07/16/2016  . Skin tag 07/16/2016  . PUD (peptic ulcer disease) 12/15/2015  . Essential hypertension 12/06/2015  . Plantar wart 05/12/2015  . Thyroid goiter 12/22/2014  . GERD (gastroesophageal reflux disease) 10/07/2014  . Seasonal allergies 10/07/2014  . Environmental allergies 10/07/2014  . Morbid obesity (HCC) 10/07/2014    Danell Vazquez Rober Minion PT, MPH  09/24/2018, 1:35 PM  [facility] [address] [place], [place], [address] Phone: [phone]   Fax:  [phone]  Name: Joanne Winters MRN: [id number] Date of Birth: 05/12/1966

## 2018-09-24 NOTE — Patient Instructions (Signed)
Axial Extension (Chin Tuck)    Pull chin in and lengthen back of neck. Hold __5__ seconds while counting out loud. Repeat __10__ times. Do __several__ sessions per day.  Shoulder Blade Squeeze    Rotate shoulders back, then squeeze shoulder blades down and back  Hold 10 sec Repeat __10__ times. Do __several__ sessions per day.   Over door shoulder pulley  3 inch swim noodle   TENS UNIT: This is helpful for muscle pain and spasm.   Search and Purchase a TENS 7000 2nd edition at www.tenspros.com. It should be less than $30.     TENS unit instructions: Do not shower or bathe with the unit on Turn the unit off before removing electrodes or batteries If the electrodes lose stickiness add a drop of water to the electrodes after they are disconnected from the unit and place on plastic sheet. If you continued to have difficulty, call the TENS unit company to purchase more electrodes. Do not apply lotion on the skin area prior to use. Make sure the skin is clean and dry as this will help prolong the life of the electrodes. After use, always check skin for unusual red areas, rash or other skin difficulties. If there are any skin problems, does not apply electrodes to the same area. Never remove the electrodes from the unit by pulling the wires. Do not use the TENS unit or electrodes other than as directed. Do not change electrode placement without consultating your therapist or physician. Keep 2 fingers with between each electrode.      [facility] [address] [place], [place] [address]  [phone] (office) [phone] (fax)

## 2018-09-25 ENCOUNTER — Encounter: Payer: Self-pay | Admitting: Physician Assistant

## 2018-10-01 ENCOUNTER — Ambulatory Visit: Payer: 59 | Admitting: Rehabilitative and Restorative Service Providers"

## 2018-10-01 ENCOUNTER — Encounter: Payer: Self-pay | Admitting: Rehabilitative and Restorative Service Providers"

## 2018-10-01 DIAGNOSIS — M25511 Pain in right shoulder: Secondary | ICD-10-CM

## 2018-10-01 DIAGNOSIS — R293 Abnormal posture: Secondary | ICD-10-CM | POA: Diagnosis not present

## 2018-10-01 DIAGNOSIS — R29898 Other symptoms and signs involving the musculoskeletal system: Secondary | ICD-10-CM

## 2018-10-01 NOTE — Patient Instructions (Addendum)
External Rotator Cuff Stretch, Sitting (Passive)    Sit with elbow bent, forearm on table, palm down. Bend forward at waist until a stretch is felt in shoulder. Hold __10_ seconds.  Repeat __5_ times per session. Do __3-4_ sessions per day.   Sitting straighten elbow hold 10 sec x 5 reps   IONTOPHORESIS PATIENT PRECAUTIONS & CONTRAINDICATIONS:  . Redness under one or both electrodes can occur.  This characterized by a uniform redness that usually disappears within 12 hours of treatment. . Small pinhead size blisters may result in response to the drug.  Contact your physician if the problem persists more than 24 hours. . On rare occasions, iontophoresis therapy can result in temporary skin reactions such as rash, inflammation, irritation or burns.  The skin reactions may be the result of individual sensitivity to the ionic solution used, the condition of the skin at the start of treatment, reaction to the materials in the electrodes, allergies or sensitivity to dexamethasone, or a poor connection between the patch and your skin.  Discontinue using iontophoresis if you have any of these reactions and report to your therapist. . Remove the Patch or electrodes if you have any undue sensation of pain or burning during the treatment and report discomfort to your therapist. . Tell your Therapist if you have had known adverse reactions to the application of electrical current. . If using the Patch, the LED light will turn off when treatment is complete and the patch can be removed.  Approximate treatment time is 1-3 hours.  Remove the patch when light goes off or after 6 hours. . The Patch can be worn during normal activity, however excessive motion where the electrodes have been placed can cause poor contact between the skin and the electrode or uneven electrical current resulting in greater risk of skin irritation. [name] Kitchen Keep out of the reach of children.   . DO NOT use if you have a cardiac pacemaker  or any other electrically sensitive implanted device. . DO NOT use if you have a known sensitivity to dexamethasone. . DO NOT use during Magnetic Resonance Imaging (MRI). . DO NOT use over broken or compromised skin (e.g. sunburn, cuts, or acne) due to the increased risk of skin reaction. . DO NOT SHAVE over the area to be treated:  To establish good contact between the Patch and the skin, excessive hair may be clipped. . DO NOT place the Patch or electrodes on or over your eyes, directly over your heart, or brain. . DO NOT reuse the Patch or electrodes as this may cause burns to occur.

## 2018-10-01 NOTE — Therapy (Signed)
[facility] [place] [address] [place], [place], [address] Phone: [phone]   Fax:  [phone]  Physical Therapy Treatment  Patient Details  Name: Joanne Winters MRN: [id number] Date of Birth: 03-Nov-1966 Referring Provider (PT): Tandy Gaw, [place]   Encounter Date: 10/01/2018  PT End of Session - 10/01/18 0936    Visit Number  2    Number of Visits  12    Date for PT Re-Evaluation  11/05/18    PT Start Time  0930    PT Stop Time  1030    PT Time Calculation (min)  60 min    Activity Tolerance  Patient tolerated treatment well       Past Medical History:  Diagnosis Date  . Essential hypertension 12/06/2015  . GERD (gastroesophageal reflux disease)   . Morbid obesity (HCC) 12/22/2014  . Seasonal allergies     Past Surgical History:  Procedure Laterality Date  . ABLATION  2013  . CESAREAN SECTION    . gall stone  1995    There were no vitals filed for this visit.  Subjective Assessment - 10/01/18 0943    Subjective  Patient reports that she has been using a TENS unit at home but needs it to be adjusted to avoid such a hard impulse. She feels muscle spasm at times in the front of her shoulder. Continues to have Rt shoulder pain and Lt knee pain.    Currently in Pain?  Yes    Pain Score  5     Pain Location  Shoulder    Pain Orientation  Right    Pain Descriptors / Indicators  Throbbing;Aching    Pain Type  Acute pain    Pain Onset  More than a month ago                       [facility] Adult PT Treatment/Exercise - 10/01/18 0001      Shoulder Exercises: Seated   Extension  AROM;Right;5 reps      Shoulder Exercises: Standing   Other Standing Exercises  axial extension 10 sec x 5; scap squeeze 10 sec x 10 with swim noodle       Shoulder Exercises: Pulleys   Flexion  --   10 sec hold x 10 - limited by pain      Shoulder Exercises: Stretch   Table Stretch - Flexion  5 reps;10 seconds      Moist  Heat Therapy   Number Minutes Moist Heat  20 Minutes    Moist Heat Location  Shoulder   Rt     Cryotherapy   Number Minutes Cryotherapy  20 Minutes    Cryotherapy Location  Knee   Lt   Type of Cryotherapy  Ice pack      Electrical Stimulation   Electrical Stimulation Location  20    Electrical Stimulation Action  IFC    Electrical Stimulation Parameters  to tolerance    Electrical Stimulation Goals  Pain;Tone      Iontophoresis   Type of Iontophoresis  Dexamethasone    Location  anterior shoulder     Dose  80 mAmp    Time  12 hours       Manual Therapy   Manual therapy comments  pt supine     Soft tissue mobilization  deep tissue work Rt shoulder girdle periscapular musculature; pecs; deltoid; biceps     Myofascial Release  anterior shd/arm  Passive ROM  Rt shoulder flexion; scaption; ER              PT Education - 10/01/18 1017    Education Details  HEP ionto     Person(s) Educated  Patient    Methods  Explanation;Demonstration;Tactile cues;Verbal cues;Handout    Comprehension  Verbalized understanding;Returned demonstration;Verbal cues required;Tactile cues required          PT Long Term Goals - 09/24/18 1305      PT LONG TERM GOAL #1   Title  Decrease pain by 50-75% allowing patient to participate in therapy/HEP and sleep 4-5 hours without awakening at night 11/05/18     Time  6    Period  Weeks    Status  New      PT LONG TERM GOAL #2   Title  Increase AROM Rt shoulder to +/> Lt shoulder with minimal pain 11/05/18    Time  6    Period  Weeks    Status  New      PT LONG TERM GOAL #3   Title  Improve posture and alignment with patient to demonstrate improved upright posture with posterior shoulder girdle engaged/improved scapular-shoulder mechanics 11/05/18    Time  6    Period  Weeks    Status  New      PT LONG TERM GOAL #4   Title  Independent in HEP 11/05/18    Time  6    Period  Weeks    Status  New      PT LONG TERM GOAL #5   Title   Improve FOTO to </= 32% limitation 11/05/18    Time  6    Period  Weeks    Status  New            Plan - 10/01/18 1610    Clinical Impression Statement  Joanne Winters continues to have Rt shoulder pain with she describes as a spasm in the front of her shoulder with certain movements. Lt knee continues to be painful and she is awaiting surgery for meniscus tear which will be scheduled in December. Paitent responded well to intervention during treatment today. She has significant muscular tightness through the Rt shoulder girdle and will benefit form manual work to address muscular tightnesss; facial restrictions; and tissue extensibility. Patient will benefit form evaluation and pre-op exercise for Lt knee. REquested referral from provider.     Rehab Potential  Good    PT Frequency  2x / week    PT Duration  6 weeks    PT Treatment/Interventions  Patient/family education;ADLs/Self Care Home Management;Cryotherapy;Electrical Stimulation;Iontophoresis 4mg /ml Dexamethasone;Moist Heat;Ultrasound;Dry needling;Manual techniques;Neuromuscular re-education;Therapeutic activities;Therapeutic exercise    PT Next Visit Plan  review HEP; progress with postural correction; PROM Rt shoulder; scapular stability/strengthening/movement patterns; manual work; Armed forces operational officer; modalities as indicated assess response to manual work and Materials engineer with Plan of Care  Patient       Patient will benefit from skilled therapeutic intervention in order to improve the following deficits and impairments:  Postural dysfunction, Improper body mechanics, Pain, Increased fascial restricitons, Increased muscle spasms, Hypomobility, Decreased mobility, Decreased range of motion, Decreased activity tolerance, Impaired UE functional use  Visit Diagnosis: Acute pain of right shoulder  Other symptoms and signs involving the musculoskeletal system  Abnormal posture     Problem List Patient Active Problem  List   Diagnosis Date Noted  . Arthritis of right acromioclavicular joint 09/16/2018  . Post-traumatic  osteoarthritis of right shoulder 08/30/2018  . Chronic pain of left knee 08/30/2018  . Closed nondisplaced fracture of base of neck of left femur (HCC) 08/30/2018  . Fall 08/25/2018  . Acute pain of right shoulder 08/25/2018  . DVT (deep venous thrombosis) (HCC) 08/16/2018  . Baker's cyst of knee, left 06/05/2018  . DDD (degenerative disc disease), lumbar 05/27/2018  . Cough 02/13/2018  . Plantar fasciitis, left 09/11/2017  . Hoarseness or changing voice 09/11/2017  . Left knee pain 07/11/2017  . Multiple thyroid nodules 02/28/2017  . 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 Neuropathy 10/20/2016  . De Quervain's tenosynovitis, right 10/20/2016  . 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 Motion sickness 07/16/2016  . Skin tag 07/16/2016  . PUD (peptic ulcer disease) 12/15/2015  . Essential hypertension 12/06/2015  . Plantar wart 05/12/2015  . Thyroid goiter 12/22/2014  . GERD (gastroesophageal reflux disease) 10/07/2014  . Seasonal allergies 10/07/2014  . Environmental allergies 10/07/2014  . Morbid obesity (HCC) 10/07/2014    Joanne Winters Rober Minion PT, MPH  10/01/2018, 1:21 PM  [facility] [address] [place], [place], [address] Phone: [phone]   Fax:  [phone]  Name: Joanne Winters MRN: [id number] Date of Birth: August 17, 1966

## 2018-10-03 ENCOUNTER — Ambulatory Visit: Payer: 59 | Admitting: Rehabilitative and Restorative Service Providers"

## 2018-10-03 ENCOUNTER — Encounter: Payer: Self-pay | Admitting: Rehabilitative and Restorative Service Providers"

## 2018-10-03 DIAGNOSIS — R29898 Other symptoms and signs involving the musculoskeletal system: Secondary | ICD-10-CM

## 2018-10-03 DIAGNOSIS — R293 Abnormal posture: Secondary | ICD-10-CM

## 2018-10-03 DIAGNOSIS — M25511 Pain in right shoulder: Secondary | ICD-10-CM

## 2018-10-03 NOTE — Therapy (Addendum)
[place], [place], [address] Phone: [phone]   Fax:  [phone]  Physical Therapy Treatment  Patient Details  Name: Eda Magnussen MRN: [id number] Date of Birth: Jun 12, 1966 Referring Provider (PT): Iran Planas PA-C   Encounter Date: 10/03/2018  PT End of Session - 10/03/18 1020    Visit Number  3    Number of Visits  12    Date for PT Re-Evaluation  11/05/18    PT Start Time  9233    PT Stop Time  1110    PT Time Calculation (min)  52 min    Activity Tolerance  Patient tolerated treatment well       Past Medical History:  Diagnosis Date  . Essential hypertension 12/06/2015  . GERD (gastroesophageal reflux disease)   . Morbid obesity ([facility]) 12/22/2014  . Seasonal allergies     Past Surgical History:  Procedure Laterality Date  . ABLATION  2013  . CESAREAN SECTION    . gall stone  1995    There were no vitals filed for this visit.  Subjective Assessment - 10/03/18 1021    Subjective  Patient reports that she has increased pain in the Rt shoulder at night. She is unable to sleep due to pain. She has pain in the shoulder on a constant basis. Patient has not noticed any improvement in the shoulder pain with therapy or HEP. Awaiting knee surgery in December.     Currently in Pain?  Yes    Pain Score  5     Pain Location  Shoulder    Pain Orientation  Right    Pain Descriptors / Indicators  Throbbing;Aching         [facility] PT Assessment - 10/03/18 0001      Assessment   Medical Diagnosis  Rt shoulder dysfunction     Referring Provider (PT)  Iran Planas PA-C    Onset Date/Surgical Date  08/10/18    Hand Dominance  Right    Next MD Visit  PRN    Prior Therapy  no       AROM   Right/Left Shoulder  --   assessed in standing - all painful except shd extension    Right Shoulder Extension  52 Degrees    Right Shoulder Flexion  88 Degrees    Right Shoulder ABduction  82 Degrees    Right Shoulder Internal Rotation  18 Degrees    Right Shoulder External Rotation  42 Degrees    Left Shoulder Extension  60 Degrees    Left Shoulder Flexion  151 Degrees    Left Shoulder ABduction  150 Degrees    Left Shoulder Internal Rotation  36 Degrees    Left Shoulder External Rotation  90 Degrees      PROM   Right Shoulder Flexion  135 Degrees    Right Shoulder ABduction  135 Degrees    Right Shoulder Internal Rotation  58 Degrees    Right Shoulder External Rotation  67 Degrees      Strength   Right/Left Shoulder  --   Rt shoulder 5/5 throughout    Left Shoulder Flexion  3-/5   pain with resistive testing    Left Shoulder Extension  5/5    Left Shoulder ABduction  3-/5   mild pain with resistive testing    Left Shoulder Internal Rotation  3/5   pain with resistive testing    Left Shoulder External Rotation  3/5  mild pain with resistive testing      Palpation   Palpation comment  tightness Rt shoulder girdle including pecs; upper trap; leveator; periscapular musculature                    [facility] Adult PT Treatment/Exercise - 10/03/18 0001      Shoulder Exercises: Supine   Other Supine Exercises  scapular retraction 10 sec x 10       Shoulder Exercises: Stretch   Table Stretch - Flexion  5 reps;10 seconds    Other Shoulder Stretches  shoulder flexion assisting with Lt UE x 8 reps       Electrical Stimulation   Electrical Stimulation Location  15    Electrical Stimulation Action  IFC    Electrical Stimulation Parameters  to tolerance    Electrical Stimulation Goals  Pain;Tone      Vasopneumatic   Number Minutes Vasopneumatic   15 minutes    Vasopnuematic Location   Shoulder   Rt   Vasopneumatic Pressure  Low    Vasopneumatic Temperature   34 deg       Manual Therapy   Manual therapy comments  pt supine     Soft tissue mobilization  soft tissue work Rt shoulder girdle periscapular musculature; pecs; deltoid; biceps     Myofascial Release   anterior shd/arm    Passive ROM  Rt shoulder flexion; scaption; ER                   PT Long Term Goals - 09/24/18 1305      PT LONG TERM GOAL #1   Title  Decrease pain by 50-75% allowing patient to participate in therapy/HEP and sleep 4-5 hours without awakening at night 11/05/18     Time  6    Period  Weeks    Status  New      PT LONG TERM GOAL #2   Title  Increase AROM Rt shoulder to +/> Lt shoulder with minimal pain 11/05/18    Time  6    Period  Weeks    Status  New      PT LONG TERM GOAL #3   Title  Improve posture and alignment with patient to demonstrate improved upright posture with posterior shoulder girdle engaged/improved scapular-shoulder mechanics 11/05/18    Time  6    Period  Weeks    Status  New      PT LONG TERM GOAL #4   Title  Independent in HEP 11/05/18    Time  6    Period  Weeks    Status  New      PT LONG TERM GOAL #5   Title  Improve FOTO to </= 32% limitation 11/05/18    Time  6    Period  Weeks    Status  New            Plan - 10/03/18 1333    Clinical Impression Statement  Saryiah reports continued pain in the Rt shoulder on an almost constant basis. She has pain with active movement or any lifting/resistive motions of Rt shoulder. She has pain with resistive testing Rt IR; abduction and flexion. She has muscular tightness and pain with palpation in the Rt shoulder musculature. Patient has not shown progress toward rehab goals in the 3 visits she has attended. May benefit from further diagnostic testing.     Rehab Potential  Good    PT Frequency  2x /  week    PT Duration  6 weeks    PT Treatment/Interventions  Patient/family education;ADLs/Self Care Home Management;Cryotherapy;Electrical Stimulation;Iontophoresis 46m/ml Dexamethasone;Moist Heat;Ultrasound;Dry needling;Manual techniques;Neuromuscular re-education;Therapeutic activities;Therapeutic exercise    PT Next Visit Plan  review HEP; progress with postural correction; PROM  Rt shoulder; scapular stability/strengthening/movement patterns; manual work; PLexicographer modalities as indicated; continue manual work and ionto as indicated     COncologistwith Plan of Care  Patient       Patient will benefit from skilled therapeutic intervention in order to improve the following deficits and impairments:  Postural dysfunction, Improper body mechanics, Pain, Increased fascial restricitons, Increased muscle spasms, Hypomobility, Decreased mobility, Decreased range of motion, Decreased activity tolerance, Impaired UE functional use  Visit Diagnosis: Acute pain of right shoulder  Other symptoms and signs involving the musculoskeletal system  Abnormal posture     Problem List Patient Active Problem List   Diagnosis Date Noted  . Arthritis of right acromioclavicular joint 09/16/2018  . 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  . Chronic pain of left knee 08/30/2018  . Closed nondisplaced fracture of base of neck of left femur ([facility] 08/30/2018  . Fall 08/25/2018  . Acute pain of right shoulder 08/25/2018  . D[facility]) [facility] 08/16/2018  . Baker's cyst of knee, left 06/05/2018  . DDD (degenerative disc disease), lumbar 05/27/2018  . Cough 02/13/2018  . Plantar fasciitis, left 09/11/2017  . Hoarseness or changing voice 09/11/2017  . Left knee pain 07/11/2017  . Multiple thyroid nodules 02/28/2017  . 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 Neuropathy 10/20/2016  . De Quervain's tenosynovitis, right 10/20/2016  . 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 Motion sickness 07/16/2016  . Skin tag 07/16/2016  . PUD (peptic ulcer disease) 12/15/2015  . Essential hypertension 12/06/2015  . Plantar wart 05/12/2015  . Thyroid goiter 12/22/2014  . GERD (gastroesophageal reflux disease) 10/07/2014  . Seasonal allergies 10/07/2014  . Environmental allergies 10/07/2014  . Morbid obesity ([facility] 10/07/2014    Gemma Ruan PNilda SimmerPT, MPH  10/03/2018, 1:40  PM  [email or web address] [address]Phone: [phone]  Fax:  [phone] Name: PCarmalita WakefieldMRN: [id number] of Birth: [birth year]\[facility]THER[facility] DISCHARGE SUMMARY  Visits from Start of Care: 3  Current functional level related to goals / functional outcomes: See last progress not for discharge status    Remaining deficits: Pain limiting participation in rehab - referred for additional diagnostic testing    Education / Equipment: HEP Plan: Patient agrees to discharge.  Patient goals were not met. Patient is being discharged due to                                                     ?????    Referred for additional diagnostic testing - labral tear and RC tear injected by MD No additional PT orders to date.   Kindle Strohmeier P. HHelene KelpPT, MPH 11/12/18 12:51 PM

## 2018-10-08 ENCOUNTER — Ambulatory Visit: Payer: 59 | Admitting: Physician Assistant

## 2018-10-08 ENCOUNTER — Encounter: Payer: 59 | Admitting: Physical Therapy

## 2018-10-08 ENCOUNTER — Encounter (INDEPENDENT_AMBULATORY_CARE_PROVIDER_SITE_OTHER): Payer: Self-pay | Admitting: Orthopaedic Surgery

## 2018-10-08 ENCOUNTER — Encounter: Payer: Self-pay | Admitting: Physician Assistant

## 2018-10-08 DIAGNOSIS — M25511 Pain in right shoulder: Secondary | ICD-10-CM

## 2018-10-08 MED ORDER — OXYCODONE-ACETAMINOPHEN 5-325 MG PO TABS: 1.0000 | ORAL_TABLET | Freq: Four times a day (QID) | ORAL | 0 refills | Status: AC | PRN

## 2018-10-08 NOTE — Progress Notes (Signed)
Subjective:    Patient ID: Joanne Winters, female    DOB: [birth year], 52 y.o.   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   Xray done in September on right shoulder that showed AC joint arthritis. She fell out of tow truck on her right shoulder on 08/10/18. She can't take NSAIDs. She was given diclofenac gel but has not picked it up yet. She went to 3 PT visits for right shoulder with no improvement and made pain worse. She continues to not be able to lift right arm and move it in all directions. Her strength has decreased as well. Pain is worse at night. She has oxycodone but only 4 tablets left. She is only using as needed.   .. Active Ambulatory Problems    Diagnosis Date Noted  . GERD (gastroesophageal reflux disease) 10/07/2014  . Seasonal allergies 10/07/2014  . Environmental allergies 10/07/2014  . Morbid obesity ([facility]) 10/07/2014  . Thyroid goiter 12/22/2014  . Plantar wart 05/12/2015  . Essential hypertension 12/06/2015  . PUD (peptic ulcer disease) 12/15/2015  . 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 Motion sickness 07/16/2016  . Skin tag 07/16/2016  . Neuropathy 10/20/2016  . De Quervain's tenosynovitis, right 10/20/2016  . 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 Multiple thyroid nodules 02/28/2017  . Left knee pain 07/11/2017  . Plantar fasciitis, left 09/11/2017  . Hoarseness or changing voice 09/11/2017  . Cough 02/13/2018  . DDD (degenerative disc disease), lumbar 05/27/2018  . Baker's cyst of knee, left 06/05/2018  . DVT (deep venous thrombosis) ([facility]) 08/16/2018  . Fall 08/25/2018  . Acute pain of right shoulder 08/25/2018  . 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  . Chronic pain of left knee 08/30/2018  . Closed nondisplaced fracture of base of neck of left femur ([facility]) 08/30/2018  . Arthritis of right acromioclavicular joint 09/16/2018   Resolved  Ambulatory Problems    Diagnosis Date Noted  . Lateral epicondylitis of right elbow 12/22/2014  . Morbid obesity ([facility]) 12/22/2014  . Abnormal weight gain 03/01/2015  . Right calf pain 04/26/2015  . Elevated blood pressure 04/26/2015  . Pain in lower limb 05/26/2015  . Heel spur, right 09/11/2017  . Throat tightness 09/11/2017  . Spasm of muscle, back 02/28/2018  . Hamstring strain, left, subsequent encounter 05/24/2018   No Additional Past Medical History      Review of Systems See HPI.     Objective:   Physical Exam  Constitutional: She appears well-developed and well-nourished.  Musculoskeletal:  Right shoulder:  Pain to palpation anterior shoulder.  Pain with abduction and not able to actively lift arm above 120 degrees.  Pain with external ROM.  Strength arm decreased to 3/5.  Neurological: She is alert.  Psychiatric: She has a normal mood and affect. Her behavior is normal.          Assessment & Plan:  [name] KitchenMarland KitchenDiagnoses and all orders for this visit:  Acute pain of right shoulder -     MR Shoulder Right Wo Contrast -     oxyCODONE-acetaminophen (PERCOCET/ROXICET) 5-325 MG tablet; Take 1-2 tablets by mouth every 6 (six) hours as needed for up to 5 days.    Pain persistent for 2 months. Failed PT. Cannot take NSAIDs. Get diclofenac gel and use that over right shoulder. Small quantity of oxycodone given to use as needed for  severe pain. Discussed abuse potential. Use tylenol during the day. Need to get MRI of right shoulder. Will need to follow up with sports medicine or ortho once we get MRI.   [place] controlled substance database reviewed without concern.

## 2018-10-08 NOTE — Progress Notes (Signed)
Thank you for reaching out. I saw her today and ordered MRI of shoulder.

## 2018-10-10 ENCOUNTER — Encounter: Payer: 59 | Admitting: Physical Therapy

## 2018-10-10 MED ORDER — DIAZEPAM 5 MG PO TABS: ORAL_TABLET | ORAL | 0 refills | Status: DC

## 2018-10-10 NOTE — Telephone Encounter (Signed)
-----   Message from Sela Hilding sent at 10/09/2018  4:14 PM EDT ----- Regarding: MRI Pre-Meds Patient is requesting pre-medication for claustrophobia.  MRI shoulder is scheduled for Monday, 11/4.

## 2018-10-14 NOTE — Telephone Encounter (Signed)
Confused-2 different messages?. Joanne Winters needs to be off her eliquis before she has surgery. Length  of treatment to be determined by her primary care MD

## 2018-10-15 ENCOUNTER — Encounter: Payer: Self-pay | Admitting: Physician Assistant

## 2018-10-16 ENCOUNTER — Telehealth (INDEPENDENT_AMBULATORY_CARE_PROVIDER_SITE_OTHER): Payer: Self-pay | Admitting: Orthopaedic Surgery

## 2018-10-16 NOTE — Telephone Encounter (Signed)
Patient called stating she is scheduled for surgery in December and is "very confused" about her prescription of Eliquis and how long she needs to be off the medicine before the surgery.  Patient is requesting a return call.

## 2018-10-17 ENCOUNTER — Telehealth (INDEPENDENT_AMBULATORY_CARE_PROVIDER_SITE_OTHER): Payer: Self-pay | Admitting: Orthopaedic Surgery

## 2018-10-17 ENCOUNTER — Encounter: Payer: Self-pay | Admitting: Physician Assistant

## 2018-10-17 DIAGNOSIS — I824Z2 Acute embolism and thrombosis of unspecified deep veins of left distal lower extremity: Secondary | ICD-10-CM

## 2018-10-17 NOTE — Telephone Encounter (Signed)
Patient left a voicemail requesting a return call.  Patient states she received a call from [place] but "couldn't understand his message."

## 2018-10-17 NOTE — Telephone Encounter (Signed)
Please advise 

## 2018-10-17 NOTE — Telephone Encounter (Signed)
TALKED W PT AND NOTIFIED HER WE WERE WAITING TO HEAR BACK FROM DR. JADE TO VERIFY HOW LONG SHE WANTED HER TO BE ON BLOOD THINNER BEFORE WE COULD PROCEED WITH SURGERY. LETF MESSAGE WITH TRIAGE NURSE THAT WE NEED LETTER TO VERIFY HOW LONG DR JADE REDBACK WANTS PT ON BLOOD THINNER BEFORE WE CAN PROCEED WITH SURGERY. WAITING  FOR VERIFICATION LETTER

## 2018-10-17 NOTE — Telephone Encounter (Signed)
Left message

## 2018-10-18 NOTE — Telephone Encounter (Signed)
Please call patient. Thank you.  

## 2018-10-18 NOTE — Telephone Encounter (Signed)
Per prior discussion

## 2018-10-18 NOTE — Telephone Encounter (Signed)
Called pt and dr office. waiting on fax for clarification on how long they wanted pt on blood thinner for dvt before surgery

## 2018-10-20 NOTE — Telephone Encounter (Signed)
Ok, thanks.

## 2018-10-21 ENCOUNTER — Ambulatory Visit (INDEPENDENT_AMBULATORY_CARE_PROVIDER_SITE_OTHER): Payer: 59

## 2018-10-21 ENCOUNTER — Ambulatory Visit (INDEPENDENT_AMBULATORY_CARE_PROVIDER_SITE_OTHER): Payer: 59 | Admitting: Physician Assistant

## 2018-10-21 ENCOUNTER — Other Ambulatory Visit (HOSPITAL_COMMUNITY)
Admission: RE | Admit: 2018-10-21 | Discharge: 2018-10-21 | Disposition: A | Discharge status: Home / Self Care | Payer: 59 | Source: Ambulatory Visit | Attending: Physician Assistant | Admitting: Physician Assistant

## 2018-10-21 ENCOUNTER — Encounter: Payer: Self-pay | Admitting: Physician Assistant

## 2018-10-21 VITALS — BP 142/77 | HR 85 | Ht 63.0 in | Wt 224.0 lb

## 2018-10-21 DIAGNOSIS — S46011D Strain of muscle(s) and tendon(s) of the rotator cuff of right shoulder, subsequent encounter: Secondary | ICD-10-CM

## 2018-10-21 DIAGNOSIS — M7551 Bursitis of right shoulder: Secondary | ICD-10-CM | POA: Diagnosis not present

## 2018-10-21 DIAGNOSIS — W19XXXD Unspecified fall, subsequent encounter: Secondary | ICD-10-CM | POA: Diagnosis not present

## 2018-10-21 DIAGNOSIS — Z113 Encounter for screening for infections with a predominantly sexual mode of transmission: Secondary | ICD-10-CM | POA: Insufficient documentation

## 2018-10-21 DIAGNOSIS — R300 Dysuria: Secondary | ICD-10-CM | POA: Diagnosis not present

## 2018-10-21 DIAGNOSIS — N898 Other specified noninflammatory disorders of vagina: Secondary | ICD-10-CM

## 2018-10-21 DIAGNOSIS — Z01419 Encounter for gynecological examination (general) (routine) without abnormal findings: Secondary | ICD-10-CM

## 2018-10-21 DIAGNOSIS — M65811 Other synovitis and tenosynovitis, right shoulder: Secondary | ICD-10-CM

## 2018-10-21 DIAGNOSIS — M25411 Effusion, right shoulder: Secondary | ICD-10-CM

## 2018-10-21 LAB — POCT URINALYSIS DIPSTICK
Bilirubin, UA: NEGATIVE
Glucose, UA: NEGATIVE
Ketones, UA: NEGATIVE
LEUKOCYTES UA: NEGATIVE
NITRITE UA: NEGATIVE
PROTEIN UA: NEGATIVE
SPEC GRAV UA: 1.02 (ref 1.010–1.025)
Urobilinogen, UA: 0.2 E.U./dL
pH, UA: 5 (ref 5.0–8.0)

## 2018-10-21 NOTE — Progress Notes (Signed)
Subjective:     Joanne Winters is a 52 y.o. woman who comes in today for a  pap smear only. Her most recent annual exam was on 07/05/2018. Her most recent Pap smear was on 12/22/2014 and showed no abnormalities. Previous abnormal Pap smears: no. Contraception: pt is in a homosexual relationship/depo shot   She is having some burning with urination for last few days and some vaginal discharge. Discharge is whitish. Maybe a little bit of odor. No fever, chills, n/v/d.  The following portions of the patient's history were reviewed and updated as appropriate: allergies, current medications, past family history, past medical history, past social history, past surgical history and problem list.  Review of Systems A comprehensive review of systems was negative.   Objective:    BP (!) 142/77   Pulse 85   Ht 5\' 3"  (1.6 m)   Wt 224 lb (101.6 kg)   BMI 39.68 kg/m  Pelvic Exam: cervix normal in appearance, external genitalia normal, no cervical motion tenderness, uterus normal size, shape, and consistency, vagina normal without discharge and pain with spectulum exam. . Pap smear obtained.   Assessment:    Screening pap smear.   Plan:    Follow up in 5 years, or as indicated by Pap results Next CPE in 1 year.   .. Results for orders placed or performed in visit on 10/21/18  POCT Urinalysis Dipstick  Result Value Ref Range   Color, UA yellow    Clarity, UA clear    Glucose, UA Negative Negative   Bilirubin, UA negative    Ketones, UA negative    Spec Grav, UA 1.020 1.010 - 1.025   Blood, UA small    pH, UA 5.0 5.0 - 8.0   Protein, UA Negative Negative   Urobilinogen, UA 0.2 0.2 or 1.0 E.U./dL   Nitrite, UA negative    Leukocytes, UA Negative Negative   Appearance     Odor     Will culture and treat accordingly Pap ordered with STI, BV, yeast testing.

## 2018-10-22 ENCOUNTER — Encounter: Payer: Self-pay | Admitting: Physician Assistant

## 2018-10-22 DIAGNOSIS — M67911 Unspecified disorder of synovium and tendon, right shoulder: Secondary | ICD-10-CM | POA: Insufficient documentation

## 2018-10-22 NOTE — Progress Notes (Signed)
Will you look at this for me?   Do you think referral to you for shoulder injections would help? She has already been through a few weeks of PT with no real improvement. (see note in chart)

## 2018-10-22 NOTE — Addendum Note (Signed)
Addended by: Jomarie Longs on: 10/22/2018 03:36 PM   Modules accepted: Orders

## 2018-10-22 NOTE — Progress Notes (Signed)
Joanne Winters,   Call patient and let her know that she has multiple tears in rotator cuff, she has bursitis and swelling. Needs ortho referral. I will try to get in same office as [facility]  Joanne Winters, can we make sure goes to same office as [facility] if they have a shoulder provider.

## 2018-10-23 ENCOUNTER — Telehealth (INDEPENDENT_AMBULATORY_CARE_PROVIDER_SITE_OTHER): Payer: Self-pay | Admitting: Orthopaedic Surgery

## 2018-10-23 LAB — URINE CULTURE
MICRO NUMBER: 91327193
SPECIMEN QUALITY: ADEQUATE

## 2018-10-23 NOTE — Progress Notes (Signed)
Sent referral to [facility] which is the same office as Dr. Cleophas Dunker - [facility]

## 2018-10-23 NOTE — Telephone Encounter (Signed)
Error

## 2018-10-23 NOTE — Progress Notes (Signed)
Cal pt: culture negative for any bacterial infection.

## 2018-10-24 LAB — CYTOLOGY - PAP
ADEQUACY: ABSENT
Bacterial vaginitis: POSITIVE — AB
CANDIDA VAGINITIS: NEGATIVE
Chlamydia: NEGATIVE
Diagnosis: NEGATIVE
HPV: NOT DETECTED
Neisseria Gonorrhea: NEGATIVE
TRICH (WINDOWPATH): NEGATIVE

## 2018-10-24 LAB — CERVICOVAGINAL ANCILLARY ONLY: HERPES (WINDOWPATH): NEGATIVE

## 2018-10-24 NOTE — Progress Notes (Signed)
Call pt: no abnormal cells. Negative for HPV. Ok for 5 years.  Negative STI's.  Bacterial vaginosis detected. Please send metronidzole 500mg  bid for 7 days. 14 NRF

## 2018-10-25 ENCOUNTER — Other Ambulatory Visit: Payer: Self-pay

## 2018-10-25 ENCOUNTER — Encounter: Payer: Self-pay | Admitting: Physician Assistant

## 2018-10-25 MED ORDER — METRONIDAZOLE 500 MG PO TABS: 500.0000 mg | ORAL_TABLET | Freq: Two times a day (BID) | ORAL | 0 refills | Status: DC

## 2018-10-29 ENCOUNTER — Other Ambulatory Visit: Payer: Self-pay | Admitting: Physician Assistant

## 2018-10-29 DIAGNOSIS — M25562 Pain in left knee: Secondary | ICD-10-CM

## 2018-10-29 DIAGNOSIS — G8929 Other chronic pain: Secondary | ICD-10-CM

## 2018-10-29 DIAGNOSIS — M25511 Pain in right shoulder: Secondary | ICD-10-CM

## 2018-10-29 DIAGNOSIS — M19111 Post-traumatic osteoarthritis, right shoulder: Secondary | ICD-10-CM

## 2018-11-01 ENCOUNTER — Other Ambulatory Visit: Payer: Self-pay | Admitting: Physician Assistant

## 2018-11-01 IMAGING — MR MR KNEE*L* W/O CM
7 series · 40 of 40 positions shown · non-contrast
Comparison: None.

EXAM:
MRI OF THE LEFT KNEE WITHOUT CONTRAST
TECHNIQUE: Multiplanar, multisequence MR imaging of the knee was performed. No
intravenous contrast was administered.

[Series 3: T2 fat-sat · axial · 4.0mm · 0.53mm/px · z∈[-87,+48]mm · 6 of 28 slices shown (1 of 3)]
[im 1/28]
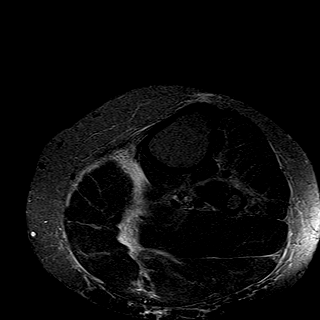
[im 6/28]
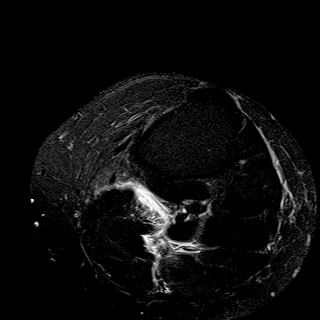
[im 11/28]
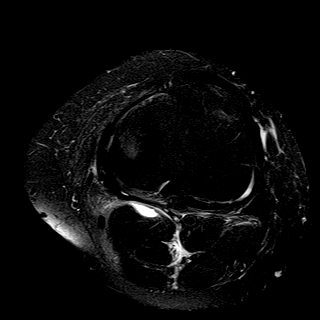
[im 17/28]
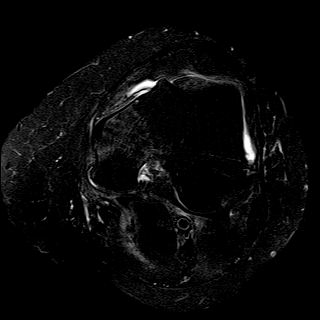
[im 22/28]
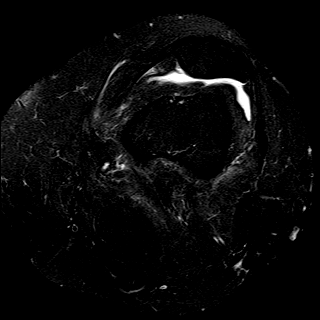
[im 28/28]
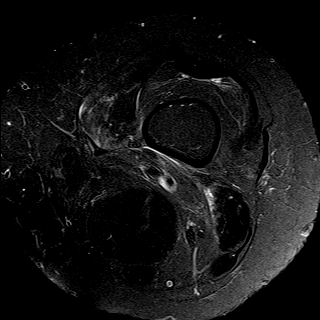

[Series 4: T1 · coronal · 4.0mm · 0.62mm/px · 6 of 31 slices shown]
[im 1/31]
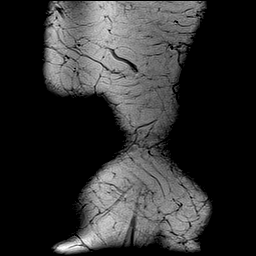
[im 7/31]
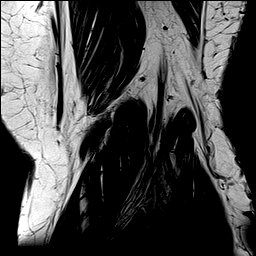
[im 13/31]
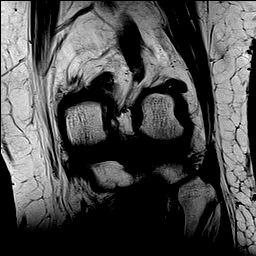
[im 19/31]
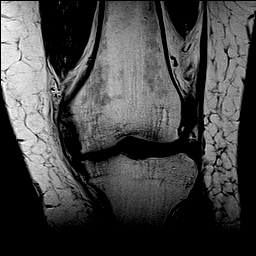
[im 25/31]
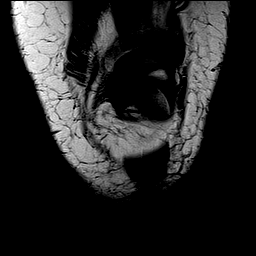
[im 31/31]
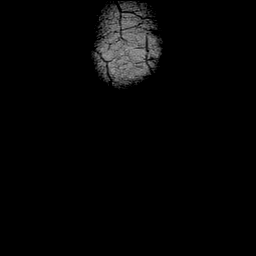

[Series 5: T2 fat-sat · coronal · 4.0mm · 0.62mm/px · 6 of 31 slices shown (2 of 3)]
[im 1/31]
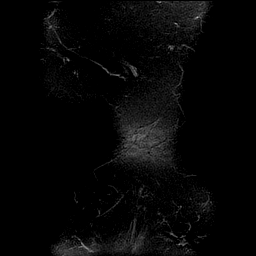
[im 7/31]
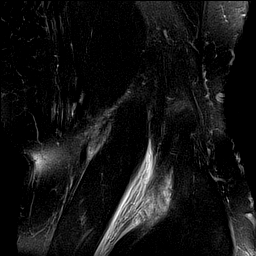
[im 13/31]
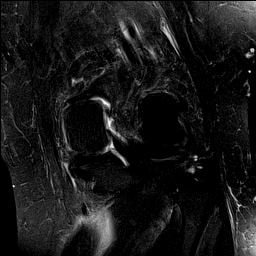
[im 19/31]
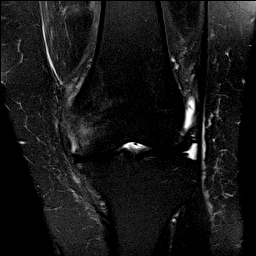
[im 25/31]
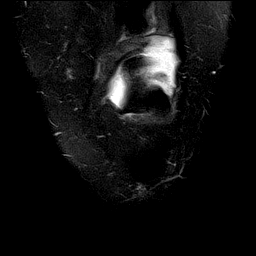
[im 31/31]
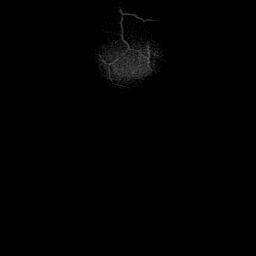

[Series 6: PD fat-sat · coronal · 4.0mm · 0.62mm/px · 6 of 31 slices shown (1 of 3)]
[im 1/31]
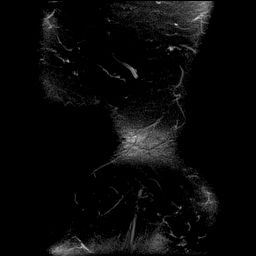
[im 7/31]
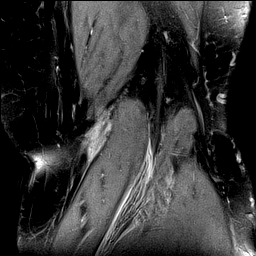
[im 13/31]
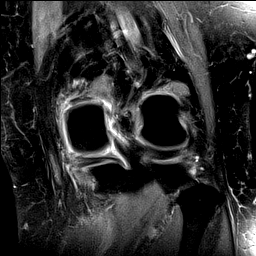
[im 19/31]
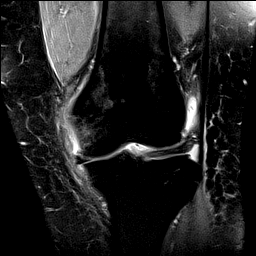
[im 25/31]
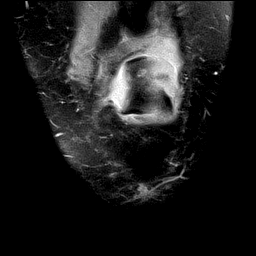
[im 31/31]
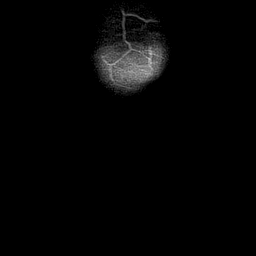

[Series 7: PD fat-sat · sagittal · 3.0mm · 0.62mm/px · 6 of 32 slices shown (2 of 3)]
[im 1/32]
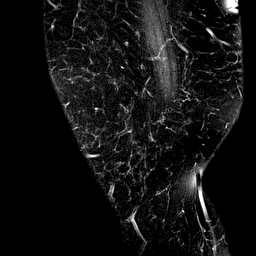
[im 7/32]
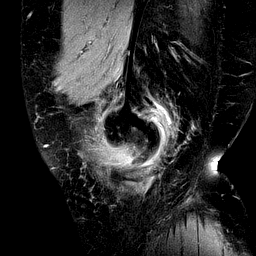
[im 13/32]
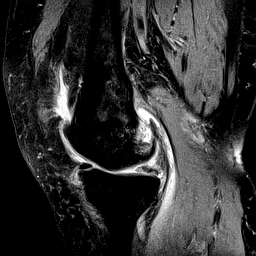
[im 19/32]
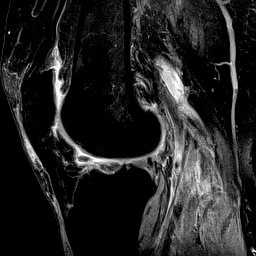
[im 25/32]
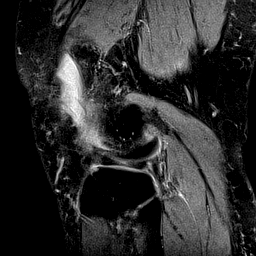
[im 32/32]
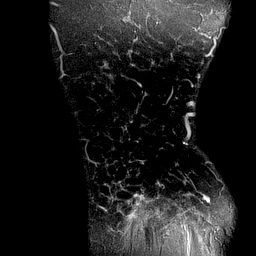

[Series 8: T2 fat-sat · sagittal · 3.0mm · 0.62mm/px · 6 of 32 slices shown (3 of 3)]
[im 1/32]
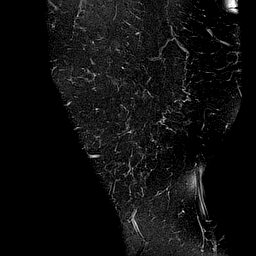
[im 7/32]
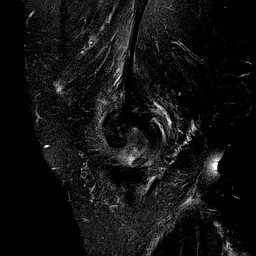
[im 13/32]
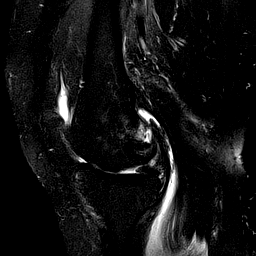
[im 19/32]
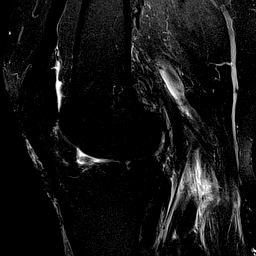
[im 25/32]
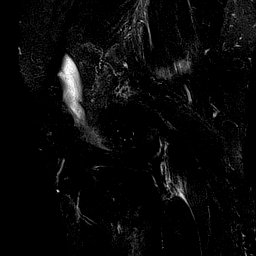
[im 32/32]
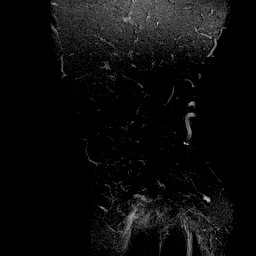

[Series 9: PD fat-sat · coronal · 2.0mm · 0.62mm/px · 4 of 19 slices shown (3 of 3)]
[im 1/19]
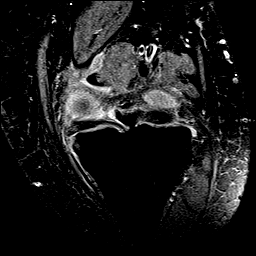
[im 7/19]
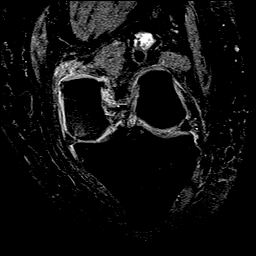
[im 13/19]
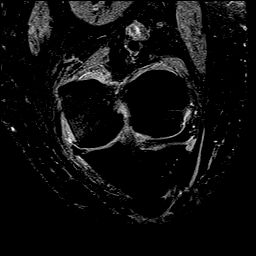
[im 19/19]
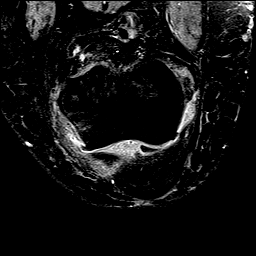

[40 of 40 positions shown; findings below may reference images not displayed]

FINDINGS: MENISCI

Medial meniscus: Mucoid degeneration of the medial meniscus with
inferior articular surfacing tear of the body, series [DATE].

Lateral meniscus: Signal intensity surfacing both superiorly and
inferiorly compatible with a complex tear of the body of the lateral
meniscus, series [DATE], involving the middle third.

LIGAMENTS

Cruciates: Slight posterior convex bowing of a slightly thickened
ACL compatible with a grade 1 sprain.

Collaterals: Thickened MCL with overlying edema consistent with a
grade 1 sprain. Intact LCL.

CARTILAGE

Patellofemoral: Mild chondral thinning of the patellar facet
cartilage and cartilage overlying the median ridge. Intact trochlear
cartilage without focal chondral defects.

Medial: Diffuse chondral thinning consistent with chondromalacia is
identified of the femoral and tibial plateau cartilage within the
medial femorotibial compartment up to grade 4 full-thickness.

Lateral:  No chondral defect.

Joint:  Trace joint effusion.  Thin suprapatellar plica.

Popliteal Fossa: Edema in the expected location of a popliteal cyst.
Intact popliteus.

Extensor Mechanism:  Intact quadriceps, patellar tendon and MPFL.

Bones: Shallow osteochondral fracture with edema involving the
medial femoral condyle posterolaterally, series [DATE] and series [DATE].
This measures approximately 10 mm AP x 6 mm transverse.

Other: Anterior subcutaneous soft tissue edema overlying the patella
and patellar tendon.
IMPRESSION: 1. Shallow osteochondral fracture with overlying soft tissue edema
involving the medial femoral condyle posterolaterally with slight
flattening of the femoral condyle spanning an area that measures 10
x 6 mm AP by transverse.
2. Sprains of the MCL and ACL.
3. Chondromalacia of the medial femorotibial cartilage.
4. Inferior articular surfacing tear suggested of the body of the
medial meniscus and complex tear involving the body the lateral
meniscus surfacing both superiorly and inferiorly involving third.
5. Trace joint effusion.

## 2018-11-05 ENCOUNTER — Encounter: Payer: Self-pay | Admitting: *Deleted

## 2018-11-05 ENCOUNTER — Other Ambulatory Visit: Payer: Self-pay

## 2018-11-05 ENCOUNTER — Emergency Department
Admission: EM | Admit: 2018-11-05 | Discharge: 2018-11-05 | Disposition: A | Discharge status: Home / Self Care | Payer: 59 | Source: Home / Self Care | Attending: Family Medicine | Admitting: Family Medicine

## 2018-11-05 DIAGNOSIS — Z5321 Procedure and treatment not carried out due to patient leaving prior to being seen by health care provider: Secondary | ICD-10-CM

## 2018-11-05 NOTE — ED Notes (Signed)
Pt left without being seen. Clemens Catholichristy Abijah Roussel, LPN

## 2018-11-05 NOTE — ED Triage Notes (Signed)
Pt c/o LT side LBP x 0300 today. No OTC meds.

## 2018-11-08 NOTE — ED Provider Notes (Signed)
[facility]    CSN: [id number] Arrival date & time: 11/05/18  1845     History   Chief Complaint Chief Complaint  Patient presents with  . Back Pain    HPI Joanne Winters is a 52 y.o. female.   HPI  Past Medical History:  Diagnosis Date  . Essential hypertension 12/06/2015  . GERD (gastroesophageal reflux disease)   . Morbid obesity (HCC) 12/22/2014  . Seasonal allergies     Patient Active Problem List   Diagnosis Date Noted  . Tendinopathy of right rotator cuff 10/22/2018  . Vaginal discharge 10/21/2018  . Burning with urination 10/21/2018  . Arthritis of right acromioclavicular joint 09/16/2018  . 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  . Chronic pain of left knee 08/30/2018  . Closed nondisplaced fracture of base of neck of left femur (HCC) 08/30/2018  . Fall 08/25/2018  . Acute pain of right shoulder 08/25/2018  . DVT (deep venous thrombosis) (HCC) 08/16/2018  . Baker's cyst of knee, left 06/05/2018  . DDD (degenerative disc disease), lumbar 05/27/2018  . Cough 02/13/2018  . Plantar fasciitis, left 09/11/2017  . Hoarseness or changing voice 09/11/2017  . Left knee pain 07/11/2017  . Multiple thyroid nodules 02/28/2017  . 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 Neuropathy 10/20/2016  . De Quervain's tenosynovitis, right 10/20/2016  . 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 Motion sickness 07/16/2016  . Skin tag 07/16/2016  . PUD (peptic ulcer disease) 12/15/2015  . Essential hypertension 12/06/2015  . Plantar wart 05/12/2015  . Thyroid goiter 12/22/2014  . GERD (gastroesophageal reflux disease) 10/07/2014  . Seasonal allergies 10/07/2014  . Environmental allergies 10/07/2014  . Morbid obesity (HCC) 10/07/2014    Past Surgical History:  Procedure Laterality Date  . ABLATION  2013  . CESAREAN SECTION    . gall stone  1995    OB History   None      Home Medications    Prior to Admission medications   Medication Sig Start Date End  Date Taking? Authorizing Provider  albuterol (PROAIR HFA) 108 (90 Base) MCG/ACT inhaler Inhale 1-2 puffs into the lungs every 6 (six) hours as needed for wheezing or shortness of breath. 07/11/17   Breeback, Jade L, PA-C  ALPRAZolam (XANAX) 0.5 MG tablet Take 1-2 tablets as needed for flying and travel. 02/27/18   Breeback, Jade L, PA-C  diazepam (VALIUM) 5 MG tablet One tab by mouth, 2 hours before procedure. 10/10/18   Breeback, Lonna Cobb, PA-C  diclofenac (VOLTAREN) 75 MG EC tablet TAKE 1 TABLET BY MOUTH TWICE A DAY 09/16/18   Breeback, Jade L, PA-C  diclofenac sodium (VOLTAREN) 1 % GEL APPLY 4 G TOPICALLY 4 (FOUR) TIMES DAILY. TO AFFECTED JOINT. 10/30/18   Breeback, Jade L, PA-C  ELIQUIS 5 MG TABS tablet TAKE 1 TABLET BY MOUTH TWICE A DAY 11/04/18   Breeback, Jade L, PA-C  hydrochlorothiazide (HYDRODIURIL) 25 MG tablet TAKE 1 TABLET BY MOUTH  EVERY MORNING FOR BLOOD  PRESSURE CONTROL 08/12/18   Breeback, Jade L, PA-C  metroNIDAZOLE (FLAGYL) 500 MG tablet Take 1 tablet (500 mg total) by mouth 2 (two) times daily. 10/25/18   Breeback, Jade L, PA-C  montelukast (SINGULAIR) 10 MG tablet Take 1 tablet (10 mg total) by mouth at bedtime. 07/14/16   Breeback, Jade L, PA-C  pantoprazole (PROTONIX) 40 MG tablet TAKE 1 TABLET BY MOUTH  DAILY 08/26/18   Jomarie Longs, PA-C    Family History Family History  Problem Relation Age  of Onset  . Hypertension Mother   . Hypertension Father   . Cancer - Colon Father   . Hypertension Daughter   . Cancer Maternal Grandmother        breast  . Hypertension Maternal Grandmother   . Cancer Paternal Grandmother        breast  . Hypertension Paternal Grandmother     Social History Social History   Tobacco Use  . Smoking status: Never Smoker  . Smokeless tobacco: Never Used  Substance Use Topics  . Alcohol use: Yes    Alcohol/week: 0.0 standard drinks    Comment: occ  . Drug use: No     Allergies   Hydrocodone and Tramadol   Review of Systems Review of  Systems   Physical Exam Triage Vital Signs ED Triage Vitals  Enc Vitals Group     BP 11/05/18 1913 (!) 154/100     Pulse Rate 11/05/18 1913 91     Resp 11/05/18 1913 18     Temp 11/05/18 1913 97.8 F (36.6 C)     Temp Source 11/05/18 1913 Oral     SpO2 11/05/18 1913 98 %     Weight 11/05/18 1920 235 lb (106.6 kg)     Height 11/05/18 1920 5\' 5"  (1.651 m)     Head Circumference --      Peak Flow --      Pain Score 11/05/18 1920 3     Pain Loc --      Pain Edu? --      Excl. in GC? --    No data found.  Updated Vital Signs BP (!) 154/100 (BP Location: Right Arm)   Pulse 91   Temp 97.8 F (36.6 C) (Oral)   Resp 18   Ht 5\' 5"  (1.651 m)   Wt 106.6 kg   SpO2 98%   BMI 39.11 kg/m   Visual Acuity Right Eye Distance:   Left Eye Distance:   Bilateral Distance:    Right Eye Near:   Left Eye Near:    Bilateral Near:     Physical Exam   UC Treatments / Results  Labs (all labs ordered are listed, but only abnormal results are displayed) Labs Reviewed - No data to display  EKG None  Radiology No results found.  Procedures Procedures (including critical care time)  Medications Ordered in UC Medications - No data to display  Initial Impression / Assessment and Plan / UC Course  I have reviewed the triage vital signs and the nursing notes.  Pertinent labs & imaging results that were available during my care of the patient were reviewed by me and considered in my medical decision making (see chart for details).    Patient left clinic prior to evaluation by provider.   Final Clinical Impressions(s) / UC Diagnoses   Final diagnoses:  None   Discharge Instructions   None    ED Prescriptions    None         Lattie HawBeese, Evana Runnels A, MD 11/08/18 1110

## 2018-11-11 ENCOUNTER — Ambulatory Visit (INDEPENDENT_AMBULATORY_CARE_PROVIDER_SITE_OTHER): Payer: 59 | Admitting: Orthopaedic Surgery

## 2018-11-11 ENCOUNTER — Encounter (INDEPENDENT_AMBULATORY_CARE_PROVIDER_SITE_OTHER): Payer: Self-pay | Admitting: Orthopaedic Surgery

## 2018-11-11 ENCOUNTER — Ambulatory Visit: Payer: 59

## 2018-11-11 VITALS — BP 147/94 | HR 89 | Ht 65.0 in | Wt 232.0 lb

## 2018-11-11 DIAGNOSIS — I824Z2 Acute embolism and thrombosis of unspecified deep veins of left distal lower extremity: Secondary | ICD-10-CM

## 2018-11-11 DIAGNOSIS — M25511 Pain in right shoulder: Secondary | ICD-10-CM

## 2018-11-11 DIAGNOSIS — G8929 Other chronic pain: Secondary | ICD-10-CM

## 2018-11-11 MED ORDER — METHYLPREDNISOLONE ACETATE 40 MG/ML IJ SUSP
80.0000 mg | INTRAMUSCULAR | Status: AC | PRN
  Administered 2018-11-11: 80 mg

## 2018-11-11 MED ORDER — BUPIVACAINE HCL 0.5 % IJ SOLN
2.0000 mL | INTRAMUSCULAR | Status: AC | PRN
  Administered 2018-11-11: 2 mL via INTRA_ARTICULAR

## 2018-11-11 MED ORDER — LIDOCAINE HCL 1 % IJ SOLN
2.0000 mL | INTRAMUSCULAR | Status: AC | PRN
  Administered 2018-11-11: 2 mL

## 2018-11-11 NOTE — Progress Notes (Signed)
Office Visit Note   Patient: Joanne Winters           Date of Birth: 1966-09-12           MRN: [id number] Visit Date: 11/11/2018              Requested by: Jomarie Longs, PA-C [address] [place], [place] [address] PCP: Jomarie Longs, PA-C   Assessment & Plan: Visit Diagnoses:  1. Chronic right shoulder pain     Plan: Acute onset of right shoulder pain in August when she fell out of a tow truck.  Has had an MRI scan through her primary care physician's office with some biceps tendinopathy and moderate rotator cuff tendinopathy.  There is an articular surface tear with some delamination.  Also noted was a moderate-sized glenohumeral joint effusion and mild synovitis.  I will inject the glenohumeral joint with cortisone and monitor response.  Initial response was very good.  Also scheduled to have left knee surgery around 12 December for meniscal tear.  She has finished a course of treatment for DVT  Follow-Up Instructions: Return in about 1 week (around 11/18/2018), or if symptoms worsen or fail to improve.   Orders:  Orders Placed This Encounter  Procedures  . Large Joint Inj: R glenohumeral   No orders of the defined types were placed in this encounter.     Procedures: Large Joint Inj: R glenohumeral on 11/11/2018 3:20 PM Indications: pain and diagnostic evaluation Details: 25 G 1.5 in needle, anterior approach  Arthrogram: No  Medications: 2 mL lidocaine 1 %; 2 mL bupivacaine 0.5 %; 80 mg methylPREDNISolone acetate 40 MG/ML Consent was given by the patient. Immediately prior to procedure a time out was called to verify the correct patient, procedure, equipment, support staff and site/side marked as required. Patient was prepped and draped in the usual sterile fashion.       Clinical Data: No additional findings.   Subjective: Chief Complaint  Patient presents with  . Right Shoulder - Follow-up    DOI 08/10/18 MRI Review Right Shoulder    Patient presents with complaint of right shoulder pain. She was getting out of a truck August 10, 2018 and fell onto right arm.  She has been having increasing shoulder pain. She was given prednisone by her PCP which helped while she took it, but wore off. She was then sent for physical therapy, but after three visits, they felt she needed an MRI.  The pain is worse at night, and she is unable to rest. Reaching overhead brings tears to her eyes. She is using Voltaren Gel with no relief.  She is unable to take anything for pain as she has scheduled knee surgery coming up.   HPI  Review of Systems   Objective: Vital Signs: BP (!) 147/94   Pulse 89   Ht 5\' 5"  (1.651 m)   Wt 232 lb (105.2 kg)   BMI 38.61 kg/m   Physical Exam  Constitutional: She is oriented to person, place, and time. She appears well-developed and well-nourished.  HENT:  Mouth/Throat: Oropharynx is clear and moist.  Eyes: Pupils are equal, round, and reactive to light. EOM are normal.  Pulmonary/Chest: Effort normal.  Neurological: She is alert and oriented to person, place, and time.  Skin: Skin is warm and dry.  Psychiatric: She has a normal mood and affect. Her behavior is normal.    Ortho Exam awake alert and oriented x3.  Comfortable  sitting.  Painful overhead arc of motion both actively and passively with loss of full overhead motion.  Positive impingement and empty can test biceps intact.  Skin intact.  Specialty Comments:  No specialty comments available.  Imaging: [place] Venous Img Lower Unilateral Left  Result Date: 11/11/2018 CLINICAL DATA:  History of left lower extremity DVT. Preop knee surgery. EXAM: LEFT LOWER EXTREMITY VENOUS DOPPLER ULTRASOUND TECHNIQUE: Gray-scale sonography with compression, as well as color and duplex ultrasound, were performed to evaluate the deep venous system from the level of the common femoral vein through the popliteal and proximal calf veins. COMPARISON:  08/15/2018 FINDINGS:  Normal compressibility of the common femoral, superficial femoral, and popliteal veins, as well as the proximal calf veins. No filling defects to suggest DVT on grayscale or color Doppler imaging. The posterior tibial DVT seen previously has apparently resolved. Doppler waveforms show normal direction of venous flow, normal respiratory phasicity and response to augmentation. Visualized segments of the saphenous venous system normal in caliber and compressibility. Survey views of the contralateral common femoral vein are unremarkable. IMPRESSION: No evidence of left lower extremity deep vein thrombosis. Electronically Signed   By: Corlis Leak  Hassell M.D.   On: 11/11/2018 10:27     PMFS History: Patient Active Problem List   Diagnosis Date Noted  . Tendinopathy of right rotator cuff 10/22/2018  . Vaginal discharge 10/21/2018  . Burning with urination 10/21/2018  . Arthritis of right acromioclavicular joint 09/16/2018  . 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  . Chronic pain of left knee 08/30/2018  . Closed nondisplaced fracture of base of neck of left femur (HCC) 08/30/2018  . Fall 08/25/2018  . Acute pain of right shoulder 08/25/2018  . DVT (deep venous thrombosis) (HCC) 08/16/2018  . Baker's cyst of knee, left 06/05/2018  . DDD (degenerative disc disease), lumbar 05/27/2018  . Cough 02/13/2018  . Plantar fasciitis, left 09/11/2017  . Hoarseness or changing voice 09/11/2017  . Left knee pain 07/11/2017  . Multiple thyroid nodules 02/28/2017  . 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 Neuropathy 10/20/2016  . De Quervain's tenosynovitis, right 10/20/2016  . 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 Motion sickness 07/16/2016  . Skin tag 07/16/2016  . PUD (peptic ulcer disease) 12/15/2015  . Essential hypertension 12/06/2015  . Plantar wart 05/12/2015  . Thyroid goiter 12/22/2014  . GERD (gastroesophageal reflux disease) 10/07/2014  . Seasonal allergies 10/07/2014  . Environmental allergies  10/07/2014  . Morbid obesity (HCC) 10/07/2014   Past Medical History:  Diagnosis Date  . Essential hypertension 12/06/2015  . GERD (gastroesophageal reflux disease)   . Morbid obesity (HCC) 12/22/2014  . Seasonal allergies     Family History  Problem Relation Age of Onset  . Hypertension Mother   . Hypertension Father   . Cancer - Colon Father   . Hypertension Daughter   . Cancer Maternal Grandmother        breast  . Hypertension Maternal Grandmother   . Cancer Paternal Grandmother        breast  . Hypertension Paternal Grandmother     Past Surgical History:  Procedure Laterality Date  . ABLATION  2013  . CESAREAN SECTION    . gall stone  1995   Social History   Occupational History  . Not on file  Tobacco Use  . Smoking status: Never Smoker  . Smokeless tobacco: Never Used  Substance and Sexual Activity  . Alcohol use: Yes    Alcohol/week: 0.0 standard drinks  Comment: occ  . Drug use: No  . Sexual activity: Not on file

## 2018-11-11 NOTE — Progress Notes (Signed)
Call pt: GREAT news clot is gone. Please send to orthopedic dr. Cleophas DunkerWhitfield to prepare for surgery. Go ahead and stop eliquis as previously discussed.

## 2018-11-12 ENCOUNTER — Telehealth (INDEPENDENT_AMBULATORY_CARE_PROVIDER_SITE_OTHER): Payer: Self-pay | Admitting: Orthopaedic Surgery

## 2018-11-12 NOTE — Telephone Encounter (Signed)
Please advise 

## 2018-11-12 NOTE — Telephone Encounter (Signed)
Patient left a voicemail stating she had an injection in her shoulder and is in a lot of pain and unable to move her arm.  Patient states she is not sure if this is a normal reaction to the injection.  Patient requested a return call.

## 2018-11-12 NOTE — Telephone Encounter (Signed)
Normal to have pain from cortisone for 24-48 hrs

## 2018-11-12 NOTE — Telephone Encounter (Signed)
Unable to reach patient, # disconnected, LMOM for significant other, daughter's # disconnected.

## 2018-11-12 NOTE — Telephone Encounter (Signed)
Patient called, normal per Dr. Cleophas DunkerWhitfield. Advised to call back 11/13/18 if no improvement

## 2018-11-28 ENCOUNTER — Encounter: Payer: Self-pay | Admitting: Orthopaedic Surgery

## 2018-11-28 DIAGNOSIS — M23352 Other meniscus derangements, posterior horn of lateral meniscus, left knee: Secondary | ICD-10-CM | POA: Diagnosis not present

## 2018-11-28 DIAGNOSIS — M94262 Chondromalacia, left knee: Secondary | ICD-10-CM | POA: Diagnosis not present

## 2018-11-28 DIAGNOSIS — M23322 Other meniscus derangements, posterior horn of medial meniscus, left knee: Secondary | ICD-10-CM | POA: Diagnosis not present

## 2018-11-29 ENCOUNTER — Ambulatory Visit (INDEPENDENT_AMBULATORY_CARE_PROVIDER_SITE_OTHER): Payer: 59 | Admitting: Orthopaedic Surgery

## 2018-11-29 ENCOUNTER — Encounter (INDEPENDENT_AMBULATORY_CARE_PROVIDER_SITE_OTHER): Payer: Self-pay | Admitting: Orthopaedic Surgery

## 2018-11-29 ENCOUNTER — Ambulatory Visit (HOSPITAL_COMMUNITY)
Admission: RE | Admit: 2018-11-29 | Discharge: 2018-11-29 | Disposition: A | Discharge status: Home / Self Care | Payer: 59 | Source: Ambulatory Visit | Attending: Physician Assistant | Admitting: Physician Assistant

## 2018-11-29 VITALS — Temp 97.1°F

## 2018-11-29 DIAGNOSIS — M79662 Pain in left lower leg: Secondary | ICD-10-CM

## 2018-11-29 DIAGNOSIS — M7989 Other specified soft tissue disorders: Secondary | ICD-10-CM

## 2018-11-29 MED ORDER — ONDANSETRON HCL 4 MG PO TABS: 4.0000 mg | ORAL_TABLET | Freq: Three times a day (TID) | ORAL | 0 refills | Status: DC | PRN

## 2018-11-29 MED ORDER — HYDROMORPHONE HCL 2 MG PO TABS: 2.0000 mg | ORAL_TABLET | ORAL | 0 refills | Status: DC | PRN

## 2018-11-29 NOTE — Progress Notes (Signed)
LLE venous duplex       has been completed. Preliminary results can be found under CV proc through chart review. Farrel DemarkJill Eunice, RDMS, RVT    Called results to Dr. Cleophas DunkerWhitfield.

## 2018-11-29 NOTE — Progress Notes (Signed)
Office Visit Note   Patient: Joanne Winters           Date of Birth: 1966-03-08           MRN: [id number] Visit Date: 11/29/2018              Requested by: Jomarie Longs, PA-C [address] [place], [place] [address] PCP: Jomarie Longs, PA-C   Assessment & Plan: Visit Diagnoses:  1. Pain and swelling of lower leg, left     Plan: Joanne Winters had left knee arthroscopy yesterday for tearing of the medial lateral menisci.  She also had significant degenerative changes in all 3 compartments with areas of exposed bone in the patellofemoral compartment and medially she was prescribed oxycodone for pain postoperatively she notes she is used that in the past without a problem.  Late last night she considerable nausea and pain to the point where she visited emergency facility in [place] near where she lives.  She was referred to the office this morning for reevaluation.  She has pain every place to touch without obvious infection.  She does have calf pain.  The prior history of DVT we will obtain a Doppler study.  She does have Eliquis at home.  I asked her to start taking that yesterday afternoon.  She is not sure if it actually dissolved as she had some nausea.  We will also give her a prescription for Dilaudid and Zofran. knee placed in a knee immobilizer.  She is to stay off of her feet and apply ice and take the medicine over the next day or so we will check her back on Monday.  The vascular lab will call me with the results  Follow-Up Instructions: Return in about 1 week (around 12/06/2018).   Orders:  No orders of the defined types were placed in this encounter.  Meds ordered this encounter  Medications  . HYDROmorphone (DILAUDID) 2 MG tablet    Sig: Take 1 tablet (2 mg total) by mouth every 4 (four) hours as needed for severe pain.    Dispense:  30 tablet    Refill:  0  . ondansetron (ZOFRAN) 4 MG tablet    Sig: Take 1 tablet (4 mg total) by mouth every 8 (eight)  hours as needed for nausea or vomiting.    Dispense:  20 tablet    Refill:  0      Procedures: No procedures performed   Clinical Data: No additional findings.   Subjective: Chief Complaint  Patient presents with  . Right Shoulder - Pain  Significant postop left knee pain status post arthroscopic debridement yesterday.  No fever or chills.  No shortness of breath or chest pain.  Touch thigh knee or calf  HPI  Review of Systems   Objective: Vital Signs: Temp (!) 97.1 F (36.2 C)   Physical Exam  Ortho Exam difficult to examine as she would hurt every place I touched her thigh knee and calf left lower extremity.  Neurologically intact.  Afebrile.  Might have very small effusion .no drainage from the arthroscopic portals  Specialty Comments:  No specialty comments available.  Imaging: No results found.   PMFS History: Patient Active Problem List   Diagnosis Date Noted  . Tendinopathy of right rotator cuff 10/22/2018  . Vaginal discharge 10/21/2018  . Burning with urination 10/21/2018  . Arthritis of right acromioclavicular joint 09/16/2018  . 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  . Chronic  pain of left knee 08/30/2018  . Closed nondisplaced fracture of base of neck of left femur (HCC) 08/30/2018  . Fall 08/25/2018  . Acute pain of right shoulder 08/25/2018  . DVT (deep venous thrombosis) (HCC) 08/16/2018  . Baker's cyst of knee, left 06/05/2018  . DDD (degenerative disc disease), lumbar 05/27/2018  . Cough 02/13/2018  . Plantar fasciitis, left 09/11/2017  . Hoarseness or changing voice 09/11/2017  . Left knee pain 07/11/2017  . Multiple thyroid nodules 02/28/2017  . 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 Neuropathy 10/20/2016  . De Quervain's tenosynovitis, right 10/20/2016  . 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 Motion sickness 07/16/2016  . Skin tag 07/16/2016  . PUD (peptic ulcer disease) 12/15/2015  . Essential hypertension 12/06/2015  . Plantar  wart 05/12/2015  . Thyroid goiter 12/22/2014  . GERD (gastroesophageal reflux disease) 10/07/2014  . Seasonal allergies 10/07/2014  . Environmental allergies 10/07/2014  . Morbid obesity (HCC) 10/07/2014   Past Medical History:  Diagnosis Date  . Essential hypertension 12/06/2015  . GERD (gastroesophageal reflux disease)   . Morbid obesity (HCC) 12/22/2014  . Seasonal allergies     Family History  Problem Relation Age of Onset  . Hypertension Mother   . Hypertension Father   . Cancer - Colon Father   . Hypertension Daughter   . Cancer Maternal Grandmother        breast  . Hypertension Maternal Grandmother   . Cancer Paternal Grandmother        breast  . Hypertension Paternal Grandmother     Past Surgical History:  Procedure Laterality Date  . ABLATION  2013  . CESAREAN SECTION    . gall stone  1995   Social History   Occupational History  . Not on file  Tobacco Use  . Smoking status: Never Smoker  . Smokeless tobacco: Never Used  Substance and Sexual Activity  . Alcohol use: Yes    Alcohol/week: 0.0 standard drinks    Comment: occ  . Drug use: No  . Sexual activity: Not on file     Valeria BatmanPeter W Whitfield, MD   Note - This record has been created using [employer] software.  Chart creation errors have been sought, but may not always  have been located. Such creation errors do not reflect on  the standard of medical care.

## 2018-12-02 ENCOUNTER — Encounter (INDEPENDENT_AMBULATORY_CARE_PROVIDER_SITE_OTHER): Payer: Self-pay | Admitting: Orthopaedic Surgery

## 2018-12-02 ENCOUNTER — Ambulatory Visit (INDEPENDENT_AMBULATORY_CARE_PROVIDER_SITE_OTHER): Payer: 59 | Admitting: Orthopaedic Surgery

## 2018-12-02 VITALS — BP 131/82 | HR 96 | Ht 65.0 in | Wt 234.0 lb

## 2018-12-02 DIAGNOSIS — G8929 Other chronic pain: Secondary | ICD-10-CM

## 2018-12-02 DIAGNOSIS — M25562 Pain in left knee: Secondary | ICD-10-CM

## 2018-12-02 NOTE — Progress Notes (Signed)
Office Visit Note   Patient: Joanne Winters           Date of Birth: 03/30/1966           MRN: [id number] Visit Date: 12/02/2018              Requested by: Joanne Winters, Joanne L, PA-C [address] [place], [place] [id number] PCP: Joanne Winters, Joanne L, PA-C   Assessment & Plan: Visit Diagnoses:  1. Chronic pain of left knee     Plan: 4 days status post left knee arthroscopy and feeling better.  Uses 2 crutches with weightbearing as tolerated.  Doppler study was negative for DVT.  No fever or chills.  We will continue with increased activity return in 2 weeks.  No work.  Continue Eliquis for the next week and then start aspirin Follow-Up Instructions: Return in about 2 weeks (around 12/16/2018).   Orders:  No orders of the defined types were placed in this encounter.  No orders of the defined types were placed in this encounter.     Procedures: No procedures performed   Clinical Data: No additional findings.   Subjective: Chief Complaint  Patient presents with  . Left Knee - Routine Post Op  . Knee Pain    post op , pain is alot better   4 days status post left knee arthroscopy with partial medial lateral meniscectomy.  Joanne Winters had a significant amount of degenerative changes in all 3 compartments I suspect that is going to be an issue for her over time.  She is feeling better over the weekend.  She is using 2 crutches and weightbearing as tolerated.  No related fever or chills.  I did have Doppler study performed Friday which was negative for DVT.  Taking Eliquis with prior history of DVT.  HPI  Review of Systems   Objective: Vital Signs: BP 131/82   Pulse 96   Ht 5\' 5"  (1.651 m)   Wt 234 lb (106.1 kg)   BMI 38.94 kg/m   Physical Exam  Ortho Exam arthroscopic portals left knee healing without problem.  Still having some calf pain but little if any edema distally.  Neurovascular exam intact.  Has just about full knee extension with small effusion.   Flexes about 90 degrees  Specialty Comments:  No specialty comments available.  Imaging: No results found.   PMFS History: Patient Active Problem List   Diagnosis Date Noted  . Tendinopathy of right rotator cuff 10/22/2018  . Vaginal discharge 10/21/2018  . Burning with urination 10/21/2018  . Arthritis of right acromioclavicular joint 09/16/2018  . 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  . Chronic pain of left knee 08/30/2018  . Closed nondisplaced fracture of base of neck of left femur (HCC) 08/30/2018  . Fall 08/25/2018  . Acute pain of right shoulder 08/25/2018  . DVT (deep venous thrombosis) (HCC) 08/16/2018  . Baker's cyst of knee, left 06/05/2018  . DDD (degenerative disc disease), lumbar 05/27/2018  . Cough 02/13/2018  . Plantar fasciitis, left 09/11/2017  . Hoarseness or changing voice 09/11/2017  . Left knee pain 07/11/2017  . Multiple thyroid nodules 02/28/2017  . 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 Neuropathy 10/20/2016  . De Quervain's tenosynovitis, right 10/20/2016  . 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 Motion sickness 07/16/2016  . Skin tag 07/16/2016  . PUD (peptic ulcer disease) 12/15/2015  . Essential hypertension 12/06/2015  . Plantar wart 05/12/2015  . Thyroid goiter 12/22/2014  .  GERD (gastroesophageal reflux disease) 10/07/2014  . Seasonal allergies 10/07/2014  . Environmental allergies 10/07/2014  . Morbid obesity (HCC) 10/07/2014   Past Medical History:  Diagnosis Date  . Essential hypertension 12/06/2015  . GERD (gastroesophageal reflux disease)   . Morbid obesity (HCC) 12/22/2014  . Seasonal allergies     Family History  Problem Relation Age of Onset  . Hypertension Mother   . Hypertension Father   . Cancer - Colon Father   . Hypertension Daughter   . Cancer Maternal Grandmother        breast  . Hypertension Maternal Grandmother   . Cancer Paternal Grandmother        breast  . Hypertension Paternal Grandmother     Past  Surgical History:  Procedure Laterality Date  . ABLATION  2013  . CESAREAN SECTION    . gall stone  1995   Social History   Occupational History  . Not on file  Tobacco Use  . Smoking status: Never Smoker  . Smokeless tobacco: Never Used  Substance and Sexual Activity  . Alcohol use: Yes    Alcohol/week: 0.0 standard drinks    Comment: occ  . Drug use: No  . Sexual activity: Not on file     Joanne Batman, MD   Note - This record has been created using [employer].  Chart creation errors have been sought, but may not always  have been located. Such creation errors do not reflect on  the standard of medical care.

## 2018-12-09 ENCOUNTER — Encounter (INDEPENDENT_AMBULATORY_CARE_PROVIDER_SITE_OTHER): Payer: Self-pay | Admitting: Orthopaedic Surgery

## 2018-12-09 ENCOUNTER — Telehealth (INDEPENDENT_AMBULATORY_CARE_PROVIDER_SITE_OTHER): Payer: Self-pay | Admitting: Orthopaedic Surgery

## 2018-12-09 NOTE — Telephone Encounter (Signed)
Patient left a voicemail requesting a return call to let her know when she will be able to return to work.

## 2018-12-09 NOTE — Telephone Encounter (Signed)
LMOM for patient to call office to schedule appt and to discuss returning to work. Patient should have schedule f/u appt on 12/16/18 when leaving 12/02/18 appt.

## 2018-12-16 ENCOUNTER — Encounter (INDEPENDENT_AMBULATORY_CARE_PROVIDER_SITE_OTHER): Payer: Self-pay | Admitting: Orthopaedic Surgery

## 2018-12-16 ENCOUNTER — Ambulatory Visit (INDEPENDENT_AMBULATORY_CARE_PROVIDER_SITE_OTHER): Payer: 59 | Admitting: Orthopaedic Surgery

## 2018-12-16 VITALS — BP 149/76 | HR 89 | Resp 15 | Ht 65.0 in | Wt 232.0 lb

## 2018-12-16 DIAGNOSIS — G8929 Other chronic pain: Secondary | ICD-10-CM

## 2018-12-16 DIAGNOSIS — M25562 Pain in left knee: Secondary | ICD-10-CM

## 2018-12-16 NOTE — Progress Notes (Signed)
Office Visit Note   Patient: Joanne Winters           Date of Birth: 04/20/1966           MRN: [id number] Visit Date: 12/16/2018              Requested by: Jomarie LongsBreeback, Jade L, PA-C [address] [place], [place] [address] PCP: Jomarie LongsBreeback, Jade L, PA-C   Assessment & Plan: Visit Diagnoses:  1. Chronic pain of left knee     Plan: 18 days status post left knee arthroscopy with partial medial lateral meniscectomy.  Also had considerable tricompartmental arthritis.  Had considerable pain postoperatively with a negative Doppler.  Presently doing very well and wants to return to work tomorrow.  Not having any of her preoperative pain.  We will give her a note to return to work without restrictions and return in 1 month  Follow-Up Instructions: Return in about 4 weeks (around 01/13/2019).   Orders:  No orders of the defined types were placed in this encounter.  No orders of the defined types were placed in this encounter.     Procedures: No procedures performed   Clinical Data: No additional findings.   Subjective: Chief Complaint  Patient presents with  . Left Knee - Routine Post Op  Mrs. Joanne Winters presents in the office today for a routine post op appointment, she has no complaints and is doing great. Patient wants to return to work tomorrow with no restrictions.   HPI  Review of Systems  Constitutional: Negative for chills, fatigue and fever.  HENT: Negative for sneezing, sore throat and tinnitus.   Eyes: Negative for pain and redness.  Respiratory: Negative for shortness of breath, wheezing and stridor.   Cardiovascular: Negative for leg swelling.  Gastrointestinal: Negative for abdominal pain, blood in stool and constipation.  Endocrine: Negative for polyuria.  Genitourinary: Negative for hematuria and pelvic pain.  Musculoskeletal: Negative for back pain.  Skin: Negative for rash.  Allergic/Immunologic: Negative for immunocompromised state.  Neurological:  Negative for dizziness, weakness and headaches.  Hematological: Does not bruise/bleed easily.  Psychiatric/Behavioral: Negative for confusion. The patient is not nervous/anxious.      Objective: Vital Signs: BP (!) 149/76 (BP Location: Right Arm, Patient Position: Sitting, Cuff Size: Normal)   Pulse 89   Resp 15   Ht 5\' 5"  (1.651 m)   Wt 232 lb (105.2 kg)   BMI 38.61 kg/m   Physical Exam  Ortho Exam left knee without effusion.  Arthroscopic portals healing without problem.  No calf pain.  No popliteal discomfort.  Full extension and over 100 degrees of flexion without instability.  No distal edema  Specialty Comments:  No specialty comments available.  Imaging: No results found.   PMFS History: Patient Active Problem List   Diagnosis Date Noted  . Tendinopathy of right rotator cuff 10/22/2018  . Vaginal discharge 10/21/2018  . Burning with urination 10/21/2018  . Arthritis of right acromioclavicular joint 09/16/2018  . 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  . Chronic pain of left knee 08/30/2018  . Closed nondisplaced fracture of base of neck of left femur (HCC) 08/30/2018  . Fall 08/25/2018  . Acute pain of right shoulder 08/25/2018  . DVT (deep venous thrombosis) (HCC) 08/16/2018  . Baker's cyst of knee, left 06/05/2018  . DDD (degenerative disc disease), lumbar 05/27/2018  . Cough 02/13/2018  . Plantar fasciitis, left 09/11/2017  . Hoarseness or changing voice 09/11/2017  . Left knee  pain 07/11/2017  . Multiple thyroid nodules 02/28/2017  . 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 Neuropathy 10/20/2016  . De Quervain's tenosynovitis, right 10/20/2016  . 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 Motion sickness 07/16/2016  . Skin tag 07/16/2016  . PUD (peptic ulcer disease) 12/15/2015  . Essential hypertension 12/06/2015  . Plantar wart 05/12/2015  . Thyroid goiter 12/22/2014  . GERD (gastroesophageal reflux disease) 10/07/2014  . Seasonal allergies 10/07/2014    . Environmental allergies 10/07/2014  . Morbid obesity (HCC) 10/07/2014   Past Medical History:  Diagnosis Date  . Essential hypertension 12/06/2015  . GERD (gastroesophageal reflux disease)   . Morbid obesity (HCC) 12/22/2014  . Seasonal allergies     Family History  Problem Relation Age of Onset  . Hypertension Mother   . Hypertension Father   . Cancer - Colon Father   . Hypertension Daughter   . Cancer Maternal Grandmother        breast  . Hypertension Maternal Grandmother   . Cancer Paternal Grandmother        breast  . Hypertension Paternal Grandmother     Past Surgical History:  Procedure Laterality Date  . ABLATION  2013  . CESAREAN SECTION    . gall stone  1995   Social History   Occupational History  . Not on file  Tobacco Use  . Smoking status: Never Smoker  . Smokeless tobacco: Never Used  Substance and Sexual Activity  . Alcohol use: Yes    Alcohol/week: 0.0 standard drinks    Comment: occ  . Drug use: No  . Sexual activity: Not on file

## 2019-01-16 ENCOUNTER — Encounter (INDEPENDENT_AMBULATORY_CARE_PROVIDER_SITE_OTHER): Payer: Self-pay | Admitting: Orthopaedic Surgery

## 2019-01-16 ENCOUNTER — Ambulatory Visit (INDEPENDENT_AMBULATORY_CARE_PROVIDER_SITE_OTHER): Payer: 59 | Admitting: Orthopaedic Surgery

## 2019-01-16 VITALS — BP 123/77 | HR 86 | Ht 65.0 in | Wt 232.0 lb

## 2019-01-16 DIAGNOSIS — M25562 Pain in left knee: Secondary | ICD-10-CM

## 2019-01-16 DIAGNOSIS — G8929 Other chronic pain: Secondary | ICD-10-CM

## 2019-01-16 NOTE — Progress Notes (Signed)
Office Visit Note   Patient: Joanne Winters           Date of Birth: 12/12/1966           MRN: [id number] Visit Date: 01/16/2019              Requested by: Jomarie LongsBreeback, Jade L, PA-C [address] [place], [place] [address] PCP: Jomarie LongsBreeback, Jade L, PA-C   Assessment & Plan: Visit Diagnoses:  1. Chronic pain of left knee     Plan: Approximately 6 weeks post left knee arthroscopy with debridement of the medial and lateral meniscal tear debridement of tricompartmental arthritis.  Back to work.  Having some discomfort which I would expect given the amount of arthritis.  She can take an over-the-counter medicines i.e. NSAIDs or Tylenol might want to consider a pullover knee support.  Overall doing much better than she did preoperatively and happy with the results.  Follow-Up Instructions: Return if symptoms worsen or fail to improve.   Orders:  No orders of the defined types were placed in this encounter.  No orders of the defined types were placed in this encounter.     Procedures: No procedures performed   Clinical Data: No additional findings.   Subjective: Chief Complaint  Patient presents with  . Left Knee - Follow-up  . Knee Pain     still having swelling and pain , want to know if have a work note   Working with some discomfort as I would expect given the amount of arthritis in her knee.  Occasional swelling.  Pain seems to be alleviated with over-the-counter medicines.  No shortness of breath or chest pain.  No distal edema.  Knee has not been unstable  HPI  Review of Systems   Objective: Vital Signs: BP 123/77 (BP Location: Left Arm)   Pulse 86   Ht 5\' 5"  (1.651 m)   Wt 232 lb (105.2 kg)   BMI 38.61 kg/m   Physical Exam  Ortho Exam left knee exam with well-healed arthroscopic portals.  Some mild medial joint pain diffusely.  Minimal effusion.  No lateral joint pain.  Some patellar crepitation full extension and flexion about 100 degrees without  instability  Specialty Comments:  No specialty comments available.  Imaging: No results found.   PMFS History: Patient Active Problem List   Diagnosis Date Noted  . Tendinopathy of right rotator cuff 10/22/2018  . Vaginal discharge 10/21/2018  . Burning with urination 10/21/2018  . Arthritis of right acromioclavicular joint 09/16/2018  . 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  . Chronic pain of left knee 08/30/2018  . Closed nondisplaced fracture of base of neck of left femur (HCC) 08/30/2018  . Fall 08/25/2018  . Acute pain of right shoulder 08/25/2018  . DVT (deep venous thrombosis) (HCC) 08/16/2018  . Baker's cyst of knee, left 06/05/2018  . DDD (degenerative disc disease), lumbar 05/27/2018  . Cough 02/13/2018  . Plantar fasciitis, left 09/11/2017  . Hoarseness or changing voice 09/11/2017  . Left knee pain 07/11/2017  . Multiple thyroid nodules 02/28/2017  . 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 Neuropathy 10/20/2016  . De Quervain's tenosynovitis, right 10/20/2016  . 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 Motion sickness 07/16/2016  . Skin tag 07/16/2016  . PUD (peptic ulcer disease) 12/15/2015  . Essential hypertension 12/06/2015  . Plantar wart 05/12/2015  . Thyroid goiter 12/22/2014  . GERD (gastroesophageal reflux disease) 10/07/2014  . Seasonal allergies 10/07/2014  . Environmental allergies  10/07/2014  . Morbid obesity (HCC) 10/07/2014   Past Medical History:  Diagnosis Date  . Essential hypertension 12/06/2015  . GERD (gastroesophageal reflux disease)   . Morbid obesity (HCC) 12/22/2014  . Seasonal allergies     Family History  Problem Relation Age of Onset  . Hypertension Mother   . Hypertension Father   . Cancer - Colon Father   . Hypertension Daughter   . Cancer Maternal Grandmother        breast  . Hypertension Maternal Grandmother   . Cancer Paternal Grandmother        breast  . Hypertension Paternal Grandmother     Past Surgical  History:  Procedure Laterality Date  . ABLATION  2013  . CESAREAN SECTION    . gall stone  1995   Social History   Occupational History  . Not on file  Tobacco Use  . Smoking status: Never Smoker  . Smokeless tobacco: Never Used  Substance and Sexual Activity  . Alcohol use: Yes    Alcohol/week: 0.0 standard drinks    Comment: occ  . Drug use: No  . Sexual activity: Not on file     Valeria Batman, MD   Note - This record has been created using [employer].  Chart creation errors have been sought, but may not always  have been located. Such creation errors do not reflect on  the standard of medical care.

## 2019-01-31 ENCOUNTER — Ambulatory Visit: Payer: 59 | Admitting: Family Medicine

## 2019-01-31 ENCOUNTER — Encounter: Payer: Self-pay | Admitting: Family Medicine

## 2019-01-31 VITALS — BP 147/73 | HR 70 | Ht 65.0 in | Wt 238.0 lb

## 2019-01-31 DIAGNOSIS — M25511 Pain in right shoulder: Secondary | ICD-10-CM | POA: Diagnosis not present

## 2019-01-31 DIAGNOSIS — M7581 Other shoulder lesions, right shoulder: Secondary | ICD-10-CM

## 2019-01-31 NOTE — Progress Notes (Signed)
Joanne Winters is a 53 y.o. female who presents to [facility] today for right shoulder pain.  Joanne Winters notes right shoulder pain ongoing now for a few months.  She had initial evaluation and treatment with her primary care provider as well as with [facility] as part of her treatment for her knee injury.  She had MRI in November 2019 showing rotator cuff tendinopathy and articular surface mild tearing.  She had glenohumeral injection which did not provide immediate benefit but did provide benefit a few days later.  She notes the pain is returned recently.  She has pain in her right lateral upper arm worse with overhead motion and reaching back.  No radiating pain weakness or numbness.  She has been doing home exercise program for her shoulder a bit which is helped some.    ROS:  As above  Exam:  BP (!) 147/73   Pulse 70   Ht 5\' 5"  (1.651 m)   Wt 238 lb (108 kg)   BMI 39.61 kg/m  Wt Readings from Last 5 Encounters:  01/31/19 238 lb (108 kg)  01/16/19 232 lb (105.2 kg)  12/16/18 232 lb (105.2 kg)  12/02/18 234 lb (106.1 kg)  11/11/18 232 lb (105.2 kg)   General: Well Developed, well nourished, and in no acute distress.  Neuro/Psych: Alert and oriented x3, extra-ocular muscles intact, able to move all 4 extremities, sensation grossly intact. Skin: Warm and dry, no rashes noted.  Respiratory: Not using accessory muscles, speaking in full sentences, trachea midline.  Cardiovascular: Pulses palpable, no extremity edema. Abdomen: Does not appear distended. MSK:  Right shoulder normal-appearing Not particularly tender. Range of motion abduction limited to 120 degrees. Normal external rotation. Internal rotation limited to lumbar spine. Positive Hawkins and Neer's test.  Positive empty can test. Strength is intact throughout. Pulses cap refill and sensation are intact distally.  Left shoulder normal-appearing nontender normal motion  normal strength negative impingement testing.    Lab and Radiology Results EXAM: MRI OF THE RIGHT SHOULDER WITHOUT CONTRAST  TECHNIQUE: Multiplanar, multisequence MR imaging of the shoulder was performed. No intravenous contrast was administered.  COMPARISON:  Radiographs 08/21/2018  FINDINGS: Rotator cuff: Moderate rotator cuff tendinopathy/tendinosis with interstitial tears involving the infraspinatus, supraspinatus and subscapularis tendons. There is a shallow articular surface tear involving the supraspinatus tendon with approximately 7 mm of laminar retraction of the articular fibers. No full-thickness retracted tear is identified.  Muscles:  Normal  Biceps long head: Intact. Moderate tendinopathy involving the intra-articular portion and partial-thickness tearing near the attachment site.  Acromioclavicular Joint: Mild degenerative changes. Type 2 acromion. No lateral downsloping or undersurface spurring.  Glenohumeral Joint: Moderate-sized joint effusion and mild synovitis.  Labrum: Superior labral tear extending into the region the biceps anchor. The anterior posterior labrum are intact.  Bones:  No significant bony findings.  Other: Mild subacromial/subdeltoid bursitis.  IMPRESSION: 1. Moderate rotator cuff tendinopathy/tendinosis with interstitial tears. There is also a articular surface tear involving the supraspinatus tendon. No full-thickness retracted tear. 2. Superior labral tear extending into the region the biceps anchor and involving the attachment region of the biceps tendon. 3. No significant findings for bony impingement. 4. Moderate-sized glenohumeral joint effusion and mild synovitis. 5. Mild subacromial/subdeltoid bursitis.   Electronically Signed   By: Rudie MeyerP.  Gallerani M.D.   On: 10/21/2018 15:35 I personally (independently) visualized and performed the interpretation of the images attached in this note.  Procedure: Real-time  Ultrasound Guided Injection of  right subacromial bursa Device: GE Logiq E   Images permanently stored and available for review in the ultrasound unit. Verbal informed consent obtained.  Discussed risks and benefits of procedure. Warned about infection bleeding damage to structures skin hypopigmentation and fat atrophy among others. Patient expresses understanding and agreement Time-out conducted.   Noted no overlying erythema, induration, or other signs of local infection.   Skin prepped in a sterile fashion.   Local anesthesia: Topical Ethyl chloride.   With sterile technique and under real time ultrasound guidance:  40 mg of Kenalog and 2 mL of Marcaine injected easily.   Completed without difficulty   Pain immediately resolved suggesting accurate placement of the medication.   Advised to call if fevers/chills, erythema, induration, drainage, or persistent bleeding.   Images permanently stored and available for review in the ultrasound unit.  Impression: Technically successful ultrasound guided injection.        Assessment and Plan: 53 y.o. female with right shoulder pain: Very likely due to rotator cuff tendinopathy is based on MRI November 2019.  Patient did not have immediate benefit from glenohumeral injection and had immediate benefit from subacromial injection.  Although her tear is more articular surface I believe the bursitis or impingement is the majority cause of her pain.  Plan for injection as above as well as home exercise program.  Consider dedicated physical therapy.  Recheck in a few weeks or sooner if not improving.   PDMP not reviewed this encounter. No orders of the defined types were placed in this encounter.  No orders of the defined types were placed in this encounter.   Historical information moved to improve visibility of documentation.  Past Medical History:  Diagnosis Date  . Essential hypertension 12/06/2015  . GERD (gastroesophageal reflux disease)     . Morbid obesity (HCC) 12/22/2014  . Seasonal allergies    Past Surgical History:  Procedure Laterality Date  . ABLATION  2013  . CESAREAN SECTION    . gall stone  1995   Social History   Tobacco Use  . Smoking status: Never Smoker  . Smokeless tobacco: Never Used  Substance Use Topics  . Alcohol use: Yes    Alcohol/week: 0.0 standard drinks    Comment: occ   family history includes Cancer in her maternal grandmother and paternal grandmother; Cancer - Colon in her father; Hypertension in her daughter, father, maternal grandmother, mother, and paternal grandmother.  Medications: Current Outpatient Medications  Medication Sig Dispense Refill  . albuterol (PROAIR HFA) 108 (90 Base) MCG/ACT inhaler Inhale 1-2 puffs into the lungs every 6 (six) hours as needed for wheezing or shortness of breath. 1 Inhaler 2  . ALPRAZolam (XANAX) 0.5 MG tablet Take 1-2 tablets as needed for flying and travel. 20 tablet 0  . diazepam (VALIUM) 5 MG tablet One tab by mouth, 2 hours before procedure. 2 tablet 0  . diclofenac (VOLTAREN) 75 MG EC tablet TAKE 1 TABLET BY MOUTH TWICE A DAY 60 tablet 1  . diclofenac sodium (VOLTAREN) 1 % GEL APPLY 4 G TOPICALLY 4 (FOUR) TIMES DAILY. TO AFFECTED JOINT. 100 g 1  . ELIQUIS 5 MG TABS tablet TAKE 1 TABLET BY MOUTH TWICE A DAY 60 tablet 1  . hydrochlorothiazide (HYDRODIURIL) 25 MG tablet TAKE 1 TABLET BY MOUTH  EVERY MORNING FOR BLOOD  PRESSURE CONTROL 90 tablet 1  . HYDROmorphone (DILAUDID) 2 MG tablet Take 1 tablet (2 mg total) by mouth every 4 (four) hours as needed for severe  pain. 30 tablet 0  . montelukast (SINGULAIR) 10 MG tablet Take 1 tablet (10 mg total) by mouth at bedtime. 90 tablet 4  . ondansetron (ZOFRAN) 4 MG tablet Take 1 tablet (4 mg total) by mouth every 8 (eight) hours as needed for nausea or vomiting. 20 tablet 0  . pantoprazole (PROTONIX) 40 MG tablet TAKE 1 TABLET BY MOUTH  DAILY 90 tablet 3   No current facility-administered medications for  this visit.    Allergies  Allergen Reactions  . Hydrocodone Other (See Comments)  . Tramadol     nausea      Discussed warning signs or symptoms. Please see discharge instructions. Patient expresses understanding.

## 2019-01-31 NOTE — Patient Instructions (Signed)
Thank you for coming in today. Call or go to the ER if you develop a large red swollen joint with extreme pain or oozing puss.  Work on shoulder range of motion and strength exercises.  We may consider PT as well.

## 2019-02-20 ENCOUNTER — Encounter: Payer: Self-pay | Admitting: Physician Assistant

## 2019-02-28 ENCOUNTER — Ambulatory Visit: Payer: 59 | Admitting: Physician Assistant

## 2019-02-28 ENCOUNTER — Other Ambulatory Visit: Payer: Self-pay

## 2019-02-28 ENCOUNTER — Encounter: Payer: Self-pay | Admitting: Physician Assistant

## 2019-02-28 VITALS — BP 147/75 | HR 70 | Ht 65.0 in | Wt 230.0 lb

## 2019-02-28 DIAGNOSIS — K2100 Gastro-esophageal reflux disease with esophagitis, without bleeding: Secondary | ICD-10-CM

## 2019-02-28 DIAGNOSIS — I10 Essential (primary) hypertension: Secondary | ICD-10-CM | POA: Diagnosis not present

## 2019-02-28 DIAGNOSIS — Z1211 Encounter for screening for malignant neoplasm of colon: Secondary | ICD-10-CM

## 2019-02-28 DIAGNOSIS — E6609 Other obesity due to excess calories: Secondary | ICD-10-CM | POA: Diagnosis not present

## 2019-02-28 DIAGNOSIS — Z6838 Body mass index (BMI) 38.0-38.9, adult: Secondary | ICD-10-CM

## 2019-02-28 MED ORDER — PANTOPRAZOLE SODIUM 40 MG PO TBEC: 40.0000 mg | DELAYED_RELEASE_TABLET | Freq: Every day | ORAL | 3 refills | Status: DC

## 2019-02-28 MED ORDER — PHENTERMINE HCL 15 MG PO CAPS: 15.0000 mg | ORAL_CAPSULE | ORAL | 0 refills | Status: DC

## 2019-02-28 MED ORDER — TOPIRAMATE 50 MG PO TABS: ORAL_TABLET | ORAL | 0 refills | Status: DC

## 2019-02-28 MED ORDER — HYDROCHLOROTHIAZIDE 25 MG PO TABS: ORAL_TABLET | ORAL | 3 refills | Status: DC

## 2019-02-28 NOTE — Progress Notes (Signed)
Subjective:    Patient ID: Joanne Winters, female    DOB: 03/07/1966, 53 y.o.   MRN: [id number]  HPI  Pt is a 53 yo female obese female with HTN, GERD, thyroid nodules who presents to the clinic to discuss weight loss. She has lost weight with phentermine before but gained it all back. She had surgery on her knee in December. She is doing much better and can now walk and exercise more. She was referred to bariatric to consider surgery but nutrition visit was going to be 300 dollars. She really wants to get some weight off. Insurance will not pay for weight loss medications.   .. Active Ambulatory Problems    Diagnosis Date Noted  . GERD (gastroesophageal reflux disease) 10/07/2014  . Seasonal allergies 10/07/2014  . Environmental allergies 10/07/2014  . Class 2 obesity due to excess calories without serious comorbidity with body mass index (BMI) of 38.0 to 38.9 in adult 10/07/2014  . Thyroid goiter 12/22/2014  . Plantar wart 05/12/2015  . Essential hypertension 12/06/2015  . PUD (peptic ulcer disease) 12/15/2015  . 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 Motion sickness 07/16/2016  . Skin tag 07/16/2016  . Neuropathy 10/20/2016  . De Quervain's tenosynovitis, right 10/20/2016  . 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 Multiple thyroid nodules 02/28/2017  . Left knee pain 07/11/2017  . Plantar fasciitis, left 09/11/2017  . Hoarseness or changing voice 09/11/2017  . Cough 02/13/2018  . DDD (degenerative disc disease), lumbar 05/27/2018  . Baker's cyst of knee, left 06/05/2018  . DVT (deep venous thrombosis) (HCC) 08/16/2018  . Fall 08/25/2018  . Acute pain of right shoulder 08/25/2018  . 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  . Chronic pain of left knee 08/30/2018  . Closed nondisplaced fracture of base of neck of left femur (HCC) 08/30/2018  . Arthritis of right acromioclavicular joint 09/16/2018  . Vaginal discharge 10/21/2018  . Burning with urination 10/21/2018  . Tendinopathy  of right rotator cuff 10/22/2018   Resolved Ambulatory Problems    Diagnosis Date Noted  . Lateral epicondylitis of right elbow 12/22/2014  . Morbid obesity (HCC) 12/22/2014  . Abnormal weight gain 03/01/2015  . Right calf pain 04/26/2015  . Elevated blood pressure 04/26/2015  . Pain in lower limb 05/26/2015  . Heel spur, right 09/11/2017  . Throat tightness 09/11/2017  . Spasm of muscle, back 02/28/2018  . Hamstring strain, left, subsequent encounter 05/24/2018   No Additional Past Medical History     Review of Systems See HPI.     Objective:   Physical Exam Vitals signs reviewed.  Constitutional:      Appearance: Normal appearance.  Cardiovascular:     Rate and Rhythm: Normal rate and regular rhythm.     Pulses: Normal pulses.  Pulmonary:     Effort: Pulmonary effort is normal.     Breath sounds: Normal breath sounds.  Neurological:     General: No focal deficit present.     Mental Status: She is alert and oriented to person, place, and time.  Psychiatric:        Mood and Affect: Mood normal.           Assessment & Plan:  [name] KitchenMarland KitchenPearlie was seen today for hypertension and weight check.  Diagnoses and all orders for this visit:  Class 2 obesity due to excess calories without serious comorbidity with body mass index (BMI) of 38.0 to 38.9 in adult -     topiramate (TOPAMAX) 50 MG tablet;  Take one tablet twice a day. -     phentermine 15 MG capsule; Take 1 capsule (15 mg total) by mouth every morning.  Essential hypertension -     hydrochlorothiazide (HYDRODIURIL) 25 MG tablet; Take one tablet daily.  Gastroesophageal reflux disease with esophagitis -     pantoprazole (PROTONIX) 40 MG tablet; Take 1 tablet (40 mg total) by mouth daily.  Colon cancer screening -     Ambulatory referral to Gastroenterology   .[name] KitchenDiscussed low carb diet with 1500 calories and 80g of protein.  Exercising at least 150 minutes a week.  [employer] could be a Chief Technology Officer.   Discussed options. Her insurance will not pay for weight loss medication. She was referred to surgery and there were a lot of cost there too. She was supposed to go to nutritionist but going to cost her 300 dollars. She would like something to help her lose weight that she can afford.  Low dose phentermine and topamax. Discussed side effects.  Follow up in 1 month for weight and BP check.   referral for colonoscopy made today. Encouraged to make this a priority

## 2019-03-03 ENCOUNTER — Encounter: Payer: Self-pay | Admitting: Physician Assistant

## 2019-03-22 ENCOUNTER — Other Ambulatory Visit: Payer: Self-pay | Admitting: Physician Assistant

## 2019-03-22 DIAGNOSIS — Z6838 Body mass index (BMI) 38.0-38.9, adult: Principal | ICD-10-CM

## 2019-03-22 DIAGNOSIS — E6609 Other obesity due to excess calories: Secondary | ICD-10-CM

## 2019-03-26 ENCOUNTER — Ambulatory Visit (INDEPENDENT_AMBULATORY_CARE_PROVIDER_SITE_OTHER): Payer: 59 | Admitting: Family Medicine

## 2019-03-26 ENCOUNTER — Other Ambulatory Visit: Payer: Self-pay

## 2019-03-26 ENCOUNTER — Encounter: Payer: Self-pay | Admitting: Family Medicine

## 2019-03-26 VITALS — BP 125/95 | HR 87 | Temp 97.8°F | Wt 230.0 lb

## 2019-03-26 DIAGNOSIS — M25511 Pain in right shoulder: Secondary | ICD-10-CM

## 2019-03-26 NOTE — Progress Notes (Signed)
Joanne Winters is a 53 y.o. female who [facility] today for right shoulder pain.  Joanne Winters has been seen previously for shoulder pain.  I saw her October 14.  At that time she is been seen by another orthopedic or sports medicine doctor where she had received a glenohumeral injection which provided some pain relief but did not provide immediate pain relief.  She had return of pain and we proceeded with subacromial bursa injection.  She notes that really did not help at all and would like to try doing the glenohumeral injection again if possible as she thinks that may have helped her.  She notes her shoulder pain worse with overhead motion reaching back.  This is been ongoing for several months now.    ROS:  As above  Exam:  BP (!) 125/95   Pulse 87   Temp 97.8 F (36.6 C) (Oral)   Wt 230 lb (104.3 kg)   BMI 38.27 kg/m  Wt Readings from Last 5 Encounters:  03/26/19 230 lb (104.3 kg)  02/28/19 230 lb (104.3 kg)  01/31/19 238 lb (108 kg)  01/16/19 232 lb (105.2 kg)  12/16/18 232 lb (105.2 kg)   General: Well Developed, well nourished, and in no acute distress.  Neuro/Psych: Alert and oriented x3, extra-ocular muscles intact, able to move all 4 extremities, sensation grossly intact. Skin: Warm and dry, no rashes noted.  Respiratory: Not using accessory muscles, speaking in full sentences, trachea midline.  Cardiovascular: Pulses palpable, no extremity edema. Abdomen: Does not appear distended. MSK: Right shoulder normal-appearing normal motion pain with abduction and internal rotation.  Strength is intact.    Lab and Radiology Results Procedure: Real-time Ultrasound Guided Injection of right shoulder glenohumeral joint Device: GE Logiq E   Images permanently stored and available for review in the ultrasound unit. Verbal informed consent obtained.  Discussed risks and benefits of procedure. Warned about infection bleeding  damage to structures skin hypopigmentation and fat atrophy among others. Patient expresses understanding and agreement Time-out conducted.   Noted no overlying erythema, induration, or other signs of local infection.   Skin prepped in a sterile fashion.   Local anesthesia: Topical Ethyl chloride.   With sterile technique and under real time ultrasound guidance:  40 mg of Kenalog and 3 mL of Marcaine injected easily.   Completed without difficulty   Pain immediately resolved suggesting accurate placement of the medication.   Advised to call if fevers/chills, erythema, induration, drainage, or persistent bleeding.   Images permanently stored and available for review in the ultrasound unit.  Impression: Technically successful ultrasound guided injection.      Assessment and Plan: 53 y.o. female with right shoulder pain.  Patient had substantial and immediate wrist pain response to glenohumeral injection.  Her pain is very likely due to a interarticular cause.  Plan for watchful waiting and home exercise program.  Recheck as needed.   PDMP not reviewed this encounter. No orders of the defined types were placed in this encounter.  No orders of the defined types were placed in this encounter.   Historical information moved to improve visibility of documentation.  Past Medical History:  Diagnosis Date  . Essential hypertension 12/06/2015  . GERD (gastroesophageal reflux disease)   . Morbid obesity (HCC) 12/22/2014  . Seasonal allergies    Past Surgical History:  Procedure Laterality Date  . ABLATION  2013  . CESAREAN SECTION    . gall stone  1995  Social History   Tobacco Use  . Smoking status: Never Smoker  . Smokeless tobacco: Never Used  Substance Use Topics  . Alcohol use: Yes    Alcohol/week: 0.0 standard drinks    Comment: occ   family history includes Cancer in her maternal grandmother and paternal grandmother; Cancer - Colon in her father; Hypertension in her  daughter, father, maternal grandmother, mother, and paternal grandmother.  Medications: Current Outpatient Medications  Medication Sig Dispense Refill  . ALPRAZolam (XANAX) 0.5 MG tablet Take 1-2 tablets as needed for flying and travel. (Patient not taking: Reported on 03/26/2019) 20 tablet 0  . hydrochlorothiazide (HYDRODIURIL) 25 MG tablet Take one tablet daily. (Patient not taking: Reported on 03/26/2019) 90 tablet 3  . pantoprazole (PROTONIX) 40 MG tablet Take 1 tablet (40 mg total) by mouth daily. (Patient not taking: Reported on 03/26/2019) 90 tablet 3  . phentermine 15 MG capsule Take 1 capsule (15 mg total) by mouth every morning. (Patient not taking: Reported on 03/26/2019) 30 capsule 0  . topiramate (TOPAMAX) 50 MG tablet TAKE 1 TABLET BY MOUTH TWICE A DAY (Patient not taking: Reported on 03/26/2019) 60 tablet 0   No current facility-administered medications for this visit.    Allergies  Allergen Reactions  . Hydrocodone Other (See Comments)  . Tramadol     nausea      Discussed warning signs or symptoms. Please see discharge instructions. Patient expresses understanding.

## 2019-03-26 NOTE — Patient Instructions (Signed)
Thank you for coming in today.  Call or go to the ER if you develop a large red swollen joint with extreme pain or oozing puss.   Keep me updated.

## 2019-04-04 ENCOUNTER — Ambulatory Visit: Payer: 59

## 2019-04-13 ENCOUNTER — Encounter: Payer: Self-pay | Admitting: Physician Assistant

## 2019-04-16 ENCOUNTER — Ambulatory Visit (INDEPENDENT_AMBULATORY_CARE_PROVIDER_SITE_OTHER): Payer: 59 | Admitting: Physician Assistant

## 2019-04-16 ENCOUNTER — Encounter: Payer: Self-pay | Admitting: Physician Assistant

## 2019-04-16 DIAGNOSIS — E6609 Other obesity due to excess calories: Secondary | ICD-10-CM

## 2019-04-16 DIAGNOSIS — Z6837 Body mass index (BMI) 37.0-37.9, adult: Secondary | ICD-10-CM | POA: Diagnosis not present

## 2019-04-16 MED ORDER — TOPIRAMATE 50 MG PO TABS: ORAL_TABLET | ORAL | 0 refills | Status: DC

## 2019-04-16 MED ORDER — LIRAGLUTIDE -WEIGHT MANAGEMENT 18 MG/3ML ~~LOC~~ SOPN: 0.6000 mg | PEN_INJECTOR | Freq: Every day | SUBCUTANEOUS | 1 refills | Status: DC

## 2019-04-16 MED ORDER — PHENTERMINE HCL 15 MG PO CAPS: 15.0000 mg | ORAL_CAPSULE | ORAL | 0 refills | Status: DC

## 2019-04-16 NOTE — Progress Notes (Signed)
Patient doing well. Wants to discuss increasing dose of phentermine.

## 2019-04-16 NOTE — Progress Notes (Signed)
Patient ID: Joanne Winters, female   DOB: 06/25/1966, 53 y.o.   MRN: [id number] .[name] KitchenVirtual Visit via Video Note  I connected with Avina Cobb on 04/17/19 at  9:10 AM EDT by a video enabled telemedicine application and verified that I am speaking with the correct person using two identifiers.   I discussed the limitations of evaluation and management by telemedicine and the availability of in person appointments. The patient expressed understanding and agreed to proceed.  History of Present Illness: Pt is a 53 yo obese female who calls in to discuss weight loss and phentermine/topamax combination. She is tolerating medication well. no obvious side effects. She has lost 5lbs. She is walking more. She feels like appetite control is better but still making bad food decisions. She is hoping for more weight loss. nutritionist was too expensive.   .. Active Ambulatory Problems    Diagnosis Date Noted  . GERD (gastroesophageal reflux disease) 10/07/2014  . Seasonal allergies 10/07/2014  . Environmental allergies 10/07/2014  . Class 2 obesity due to excess calories without serious comorbidity with body mass index (BMI) of 38.0 to 38.9 in adult 10/07/2014  . Thyroid goiter 12/22/2014  . Plantar wart 05/12/2015  . Essential hypertension 12/06/2015  . PUD (peptic ulcer disease) 12/15/2015  . 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 Motion sickness 07/16/2016  . Skin tag 07/16/2016  . Neuropathy 10/20/2016  . De Quervain's tenosynovitis, right 10/20/2016  . 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 Multiple thyroid nodules 02/28/2017  . Left knee pain 07/11/2017  . Plantar fasciitis, left 09/11/2017  . Hoarseness or changing voice 09/11/2017  . Cough 02/13/2018  . DDD (degenerative disc disease), lumbar 05/27/2018  . Baker's cyst of knee, left 06/05/2018  . DVT (deep venous thrombosis) (HCC) 08/16/2018  . Fall 08/25/2018  . Acute pain of right shoulder 08/25/2018  . Post-traumatic osteoarthritis of right shoulder  08/30/2018  . Chronic pain of left knee 08/30/2018  . Closed nondisplaced fracture of base of neck of left femur (HCC) 08/30/2018  . Arthritis of right acromioclavicular joint 09/16/2018  . Vaginal discharge 10/21/2018  . Burning with urination 10/21/2018  . Tendinopathy of right rotator cuff 10/22/2018   Resolved Ambulatory Problems    Diagnosis Date Noted  . Lateral epicondylitis of right elbow 12/22/2014  . Morbid obesity (HCC) 12/22/2014  . Abnormal weight gain 03/01/2015  . Right calf pain 04/26/2015  . Elevated blood pressure 04/26/2015  . Pain in lower limb 05/26/2015  . Heel spur, right 09/11/2017  . Throat tightness 09/11/2017  . Spasm of muscle, back 02/28/2018  . Hamstring strain, left, subsequent encounter 05/24/2018   No Additional Past Medical History   Reviewed med, allergy and problem list.     Observations/Objective: No acute distress.  Normal breathing.    .. Today's Vitals   04/16/19 0851  BP: 125/82  Pulse: 88  Temp: (!) 97.2 F (36.2 C)  TempSrc: Oral  Weight: 225 lb (102.1 kg)  Height: 5\' 5"  (1.651 m)   Body mass index is 37.44 kg/m.   Assessment and Plan: [name] KitchenMarland KitchenPearlie was seen today for obesity.  Diagnoses and all orders for this visit:  Class 2 obesity due to excess calories without serious comorbidity with body mass index (BMI) of 37.0 to 37.9 in adult -     phentermine 15 MG capsule; Take 1 capsule (15 mg total) by mouth every morning. -     topiramate (TOPAMAX) 50 MG tablet; TAKE 1 TABLET BY MOUTH TWICE A DAY -  Liraglutide -Weight Management (SAXENDA) 18 MG/3ML SOPN; Inject 0.6 mg into the skin daily. For one week then increase by .6mg  weekly until reaches 3mg  daily.  Please include ultra fine needles 6mm   .[name] Kitchen.Discussed low carb diet with 1500 calories and 80g of protein.  Exercising at least 150 minutes a week.  My Fitness Pal could be a [employer] resource.  Added saxenda. Discussed side effects. Explained how to use pen. Discussed  taper. Follow up in 2 months.  Continue topamax and phentermine.     Follow Up Instructions:    I discussed the assessment and treatment plan with the patient. The patient was provided an opportunity to ask questions and all were answered. The patient agreed with the plan and demonstrated an understanding of the instructions.   The patient was advised to call back or seek an in-person evaluation if the symptoms worsen or if the condition fails to improve as anticipated.  I provided 15 minutes of non-face-to-face time during this encounter.   Tandy GawJade Obed Samek, PA-C

## 2019-04-17 ENCOUNTER — Encounter: Payer: Self-pay | Admitting: Physician Assistant

## 2019-04-18 ENCOUNTER — Telehealth: Payer: Self-pay

## 2019-04-18 NOTE — Telephone Encounter (Signed)
[facility] called and states the Joanne Winters is not a covered medication under patient's plan.

## 2019-04-21 ENCOUNTER — Other Ambulatory Visit: Payer: Self-pay | Admitting: Physician Assistant

## 2019-04-21 NOTE — Telephone Encounter (Signed)
Left a message advising patient of recommendations.  

## 2019-04-21 NOTE — Telephone Encounter (Signed)
Please let patient know this. She will need to continue on phentermine/topamax combination already sent for weight loss.

## 2019-05-02 LAB — HM COLONOSCOPY

## 2019-05-07 ENCOUNTER — Encounter: Payer: Self-pay | Admitting: Physician Assistant

## 2019-05-07 DIAGNOSIS — Z800 Family history of malignant neoplasm of digestive organs: Secondary | ICD-10-CM | POA: Insufficient documentation

## 2019-05-09 ENCOUNTER — Ambulatory Visit (INDEPENDENT_AMBULATORY_CARE_PROVIDER_SITE_OTHER): Payer: 59 | Admitting: Family Medicine

## 2019-05-09 ENCOUNTER — Ambulatory Visit (INDEPENDENT_AMBULATORY_CARE_PROVIDER_SITE_OTHER): Payer: 59

## 2019-05-09 ENCOUNTER — Other Ambulatory Visit: Payer: Self-pay

## 2019-05-09 ENCOUNTER — Encounter: Payer: Self-pay | Admitting: Family Medicine

## 2019-05-09 VITALS — BP 143/84 | HR 77 | Ht 65.0 in | Wt 224.5 lb

## 2019-05-09 DIAGNOSIS — M25511 Pain in right shoulder: Secondary | ICD-10-CM

## 2019-05-09 NOTE — Progress Notes (Signed)
Joanne Winters is a 53 y.o. female who presents to [facility] today for right shoulder pain   Right shoulder pain.  Patient is been seen several times for right shoulder pain.  Her most recent visit was April 8.  She had been having ongoing shoulder pain for some time.  She had a glenohumeral injection which did not provide immediate relief but had pretty good control of pain overall.  She had a subacromial injection that she did not help.  On April 8 she had a repeat glenohumeral injection that did provide immediate pain relief.  In the interim she notes that shot only last about 3 weeks. Pain has returned quite a bit as bad as 9/10.   Pain is located into lateral upper arm.   At work has to do lifting which may have worsened her pain.    ROS as above:  Exam:  BP (!) 143/84   Pulse 77   Ht 5\' 5"  (1.651 m)   Wt 224 lb 8 oz (101.8 kg)   BMI 37.36 kg/m  Wt Readings from Last 5 Encounters:  05/09/19 224 lb 8 oz (101.8 kg)  04/16/19 225 lb (102.1 kg)  03/26/19 230 lb (104.3 kg)  02/28/19 230 lb (104.3 kg)  01/31/19 238 lb (108 kg)    Gen: Well NAD HEENT: EOMI,  MMM Lungs: Normal work of breathing. CTABL Heart: RRR no MRG Abd: NABS, Soft. Nondistended, Nontender Exts: Brisk capillary refill, warm and well perfused.  MSK: Right shoulder normal-appearing not particularly tender. Range of motion abduction limited to 100 degrees. Limited internal and external rotation as well. Positive Hawkins and Neer's test.  Positive empty can test. Intact strength.  Lab and Radiology Results X-ray images right shoulder today personally independently reviewed No significant change in appearance from previous x-ray in September 2019.  AC DJD is present.  Mild glenohumeral DJD is present no acute fractures or lytic bone lesions.. Await formal radiology review   EXAM: MRI  OF THE RIGHT SHOULDER WITHOUT CONTRAST  TECHNIQUE: Multiplanar, multisequence MR imaging of the shoulder was performed. No intravenous contrast was administered.  COMPARISON:  Radiographs 08/21/2018  FINDINGS: Rotator cuff: Moderate rotator cuff tendinopathy/tendinosis with interstitial tears involving the infraspinatus, supraspinatus and subscapularis tendons. There is a shallow articular surface tear involving the supraspinatus tendon with approximately 7 mm of laminar retraction of the articular fibers. No full-thickness retracted tear is identified.  Muscles:  Normal  Biceps long head: Intact. Moderate tendinopathy involving the intra-articular portion and partial-thickness tearing near the attachment site.  Acromioclavicular Joint: Mild degenerative changes. Type 2 acromion. No lateral downsloping or undersurface spurring.  Glenohumeral Joint: Moderate-sized joint effusion and mild synovitis.  Labrum: Superior labral tear extending into the region the biceps anchor. The anterior posterior labrum are intact.  Bones:  No significant bony findings.  Other: Mild subacromial/subdeltoid bursitis.  IMPRESSION: 1. Moderate rotator cuff tendinopathy/tendinosis with interstitial tears. There is also a articular surface tear involving the supraspinatus tendon. No full-thickness retracted tear. 2. Superior labral tear extending into the region the biceps anchor and involving the attachment region of the biceps tendon. 3. No significant findings for bony impingement. 4. Moderate-sized glenohumeral joint effusion and mild synovitis. 5. Mild subacromial/subdeltoid bursitis.   Electronically Signed   By: Rudie Meyer M.D.   On: 10/21/2018 15:35  I personally (independently) visualized and performed the interpretation of the images attached in this note.  Assessment and Plan:  53 y.o. female with continued shoulder pain.  Patient has a variety of changes seen on  MRI in November 2019.  She has articular sided rotator cuff tear as well as tendinitis as well as labrum tears.  Given her good response to most recent glenohumeral injection at least immediately I believe the majority of her pain is being generated internally.  Unfortunately she continues to struggle.  At this point I think she is effectively failed conservative management and the next best thing would be surgery.  However she notes that she just got off of FMLA for her knee and she does not want to have surgery right now.  Reasonable to modify work restrictions and proceed with watchful waiting.  Patient will let me know if she would like to have a surgical evaluation or consultation.  Additionally could proceed with repeat injection as early as August.  That would be 3 months from her most recent injection in April.  I spent 25 minutes with this patient, greater than 50% was face-to-face time counseling regarding differential diagnosis treatment plan options MRI and x-ray findings.[name] Kitchen.  PDMP not reviewed this encounter. Orders Placed This Encounter  Procedures  . DG Shoulder Right    Standing Status:   Future    Standing Expiration Date:   07/08/2020    Order Specific Question:   Reason for Exam (SYMPTOM  OR DIAGNOSIS REQUIRED)    Answer:   eval pain    Order Specific Question:   Is patient pregnant?    Answer:   No    Order Specific Question:   Preferred imaging location?    Answer[facility]    Order Specific Question:   Radiology Contrast Protocol - do NOT remove file path    Answer:   [email or web address]   No orders of the defined types were placed in this encounter.    Historical information moved to improve visibility of documentation.  Past Medical History:  Diagnosis Date  . Essential hypertension 12/06/2015  . GERD (gastroesophageal reflux disease)   . Morbid obesity (HCC) 12/22/2014  . Seasonal allergies    Past Surgical  History:  Procedure Laterality Date  . ABLATION  2013  . CESAREAN SECTION    . gall stone  1995   Social History   Tobacco Use  . Smoking status: Never Smoker  . Smokeless tobacco: Never Used  Substance Use Topics  . Alcohol use: Yes    Alcohol/week: 0.0 standard drinks    Comment: occ   family history includes Cancer in her maternal grandmother and paternal grandmother; Cancer - Colon in her father; Hypertension in her daughter, father, maternal grandmother, mother, and paternal grandmother.  Medications: Current Outpatient Medications  Medication Sig Dispense Refill  . hydrochlorothiazide (HYDRODIURIL) 25 MG tablet Take one tablet daily. 90 tablet 3  . pantoprazole (PROTONIX) 40 MG tablet Take 1 tablet (40 mg total) by mouth daily. 90 tablet 3  . phentermine 15 MG capsule Take 1 capsule (15 mg total) by mouth every morning. 90 capsule 0  . topiramate (TOPAMAX) 50 MG tablet TAKE 1 TABLET BY MOUTH TWICE A DAY 180 tablet 0   No current facility-administered medications for this visit.    Allergies  Allergen Reactions  . Hydrocodone Other (See Comments)  . Tramadol     nausea     Discussed warning signs or symptoms. Please see discharge instructions. Patient expresses understanding.

## 2019-05-09 NOTE — Patient Instructions (Signed)
Thank you for coming in today. I think your shoulder pain will likely require surgery as you have done as much as your really can for it.  Ok to modify work duties.  Send forms to me if needed.   If not doing well can repeat injection on Aug 8th or later.   I do recommend you seeing a surgeon. Let me know if you want a referral.

## 2019-05-24 ENCOUNTER — Other Ambulatory Visit: Payer: Self-pay | Admitting: Physician Assistant

## 2019-05-24 DIAGNOSIS — E6609 Other obesity due to excess calories: Secondary | ICD-10-CM

## 2019-06-06 ENCOUNTER — Encounter: Payer: Self-pay | Admitting: Physician Assistant

## 2019-06-25 ENCOUNTER — Telehealth: Payer: Self-pay | Admitting: Physician Assistant

## 2019-06-25 NOTE — Telephone Encounter (Signed)
Would agree with triage! Needs ED

## 2019-06-25 NOTE — Telephone Encounter (Signed)
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

## 2019-06-25 NOTE — Telephone Encounter (Signed)
Patient called in and stated that she wanted to make an appointment. Stated that her heart was hurting. She was trying to apply pressure. Stated that her arm was going numb and tingling. She also stated that something was going on with her face. Patient sounded out of breath. Asked triage and per nurse patient was to go to the ER ASAP. Patient stated she was going to call or try to drive herself. Nurse was to call her back to follow up as well.

## 2019-06-26 ENCOUNTER — Encounter: Payer: Self-pay | Admitting: Family Medicine

## 2019-06-26 ENCOUNTER — Ambulatory Visit (INDEPENDENT_AMBULATORY_CARE_PROVIDER_SITE_OTHER): Payer: 59 | Admitting: Family Medicine

## 2019-06-26 VITALS — Ht 65.0 in | Wt 218.0 lb

## 2019-06-26 DIAGNOSIS — K2100 Gastro-esophageal reflux disease with esophagitis, without bleeding: Secondary | ICD-10-CM

## 2019-06-26 DIAGNOSIS — R079 Chest pain, unspecified: Secondary | ICD-10-CM | POA: Diagnosis not present

## 2019-06-26 MED ORDER — SUCRALFATE 1 G PO TABS: 1.0000 g | ORAL_TABLET | Freq: Four times a day (QID) | ORAL | 0 refills | Status: DC

## 2019-06-26 MED ORDER — PANTOPRAZOLE SODIUM 40 MG PO TBEC: 40.0000 mg | DELAYED_RELEASE_TABLET | Freq: Two times a day (BID) | ORAL | 3 refills | Status: DC

## 2019-06-26 MED ORDER — CELECOXIB 100 MG PO CAPS: 100.0000 mg | ORAL_CAPSULE | Freq: Two times a day (BID) | ORAL | 2 refills | Status: DC | PRN

## 2019-06-26 NOTE — Progress Notes (Signed)
Having cough/chest pain only at night. Thinks this is from reflux, medication not working anymore. She will take blood pressure/temp and let you know readings. Needs note for work for today.

## 2019-06-26 NOTE — Patient Instructions (Signed)
Thank you for coming in today. Please increase Protonix to twice daily. Take Carafate 4 times daily for 1 week. You should hear from both the stomach doctor and a cardiologist at the [facility]. Please stop Aleve PM. I recommend trying Tylenol first for pain control and if not sufficient then taking the prescription Celebrex that I sent to the pharmacy. For the sleepiness part of Aleve p.m. that is just Benadryl 25 mg which is 1 pill.  If not rapidly improving recheck in person next week.   Gastritis, Adult Gastritis is inflammation of the stomach. There are two kinds of gastritis:  Acute gastritis. This kind develops suddenly.  Chronic gastritis. This kind is much more common and lasts for a long time. Gastritis happens when the lining of the stomach becomes weak or gets damaged. Without treatment, gastritis can lead to stomach bleeding and ulcers. What are the causes? This condition may be caused by:  An infection.  Drinking too much alcohol.  Certain medicines. These include steroids, antibiotics, and some over-the-counter medicines, such as aspirin or ibuprofen.  Having too much acid in the stomach.  A disease of the intestines or stomach.  Stress.  An allergic reaction.  Crohn's disease.  Some cancer treatments (radiation). Sometimes the cause of this condition is not known. What are the signs or symptoms? Symptoms of this condition include:  Pain or a burning sensation in the upper abdomen.  Nausea.  Vomiting.  An uncomfortable feeling of fullness after eating.  Weight loss.  Bad breath.  Blood in your vomit or stools. In some cases, there are no symptoms. How is this diagnosed? This condition may be diagnosed with:  Your medical history and a description of your symptoms.  A physical exam.  Tests. These can include: ? Blood tests. ? Stool tests. ? A test in which a thin, flexible instrument with a light and a camera is passed down  the esophagus and into the stomach (upper endoscopy). ? A test in which a sample of tissue is taken for testing (biopsy). How is this treated? This condition may be treated with medicines. The medicines that are used vary depending on the cause of the gastritis:  If the condition is caused by a bacterial infection, you may be given antibiotic medicines.  If the condition is caused by too much acid in the stomach, you may be given medicines called H2 blockers, proton pump inhibitors, or antacids. Treatment may also involve stopping the use of certain medicines, such as aspirin, ibuprofen, or other NSAIDs. Follow these instructions at home: Medicines  Take over-the-counter and prescription medicines only as told by your health care provider.  If you were prescribed an antibiotic medicine, take it as told by your health care provider. Do not stop taking the antibiotic even if you start to feel better. Eating and drinking   Eat small, frequent meals instead of large meals.  Avoid foods and drinks that make your symptoms worse.  Drink enough fluid to keep your urine pale yellow. Alcohol use  Do not drink alcohol if: ? Your health care provider tells you not to drink. ? You are pregnant, may be pregnant, or are planning to become pregnant.  If you drink alcohol: ? Limit your use to:  0-1 drink a day for women.  0-2 drinks a day for men. ? Be aware of how much alcohol is in your drink. In the [place] one drink equals one 12 oz bottle of beer (355 mL),  one 5 oz glass of wine (148 mL), or one 1 oz glass of hard liquor (44 mL). General instructions  Talk with your health care provider about ways to manage stress, such as getting regular exercise or practicing deep breathing, meditation, or yoga.  Do not use any products that contain nicotine or tobacco, such as cigarettes and e-cigarettes. If you need help quitting, ask your health care provider.  Keep all follow-up visits as told by  your health care provider. This is important. Contact a health care provider if:  Your symptoms get worse.  Your symptoms return after treatment. Get help right away if:  You vomit blood or material that looks like coffee grounds.  You have black or dark red stools.  You are unable to keep fluids down.  Your abdominal pain gets worse.  You have a fever.  You do not feel better after one week. Summary  Gastritis is inflammation of the lining of the stomach that can occur suddenly (acute) or develop slowly over time (chronic).  This condition is diagnosed with a medical history, a physical exam, or tests.  This condition may be treated with medicines to treat infection or medicines to reduce the amount of acid in your stomach.  Follow your health care provider's instructions about taking medicines, making changes to your diet, and knowing when to call for help. This information is not intended to replace advice given to you by your health care provider. Make sure you discuss any questions you have with your health care provider. Document Released: 11/28/2001 Document Revised: 04/23/2018 Document Reviewed: 04/23/2018 Elsevier Patient Education  2020 Elsevier Inc.    Costochondritis  Costochondritis is swelling and irritation (inflammation) of the tissue (cartilage) that connects your ribs to your breastbone (sternum). This causes pain in the front of your chest. The pain usually starts gradually and involves more than one rib. What are the causes? The exact cause of this condition is not always known. It results from stress on the cartilage where your ribs attach to your sternum. The cause of this stress could be:  Chest injury (trauma).  Exercise or activity, such as lifting.  Severe coughing. What increases the risk? You may be at higher risk for this condition if you:  Are female.  Are 1931?53 years old.  Recently started a new exercise or work activity.  Have low  levels of vitamin D.  Have a condition that makes you cough frequently. What are the signs or symptoms? The main symptom of this condition is chest pain. The pain:  Usually starts gradually and can be sharp or dull.  Gets worse with deep breathing, coughing, or exercise.  Gets better with rest.  May be worse when you press on the sternum-rib connection (tenderness). How is this diagnosed? This condition is diagnosed based on your symptoms, medical history, and a physical exam. Your health care provider will check for tenderness when pressing on your sternum. This is the most important finding. You may also have tests to rule out other causes of chest pain. These may include:  A chest X-ray to check for lung problems.  An electrocardiogram (ECG) to see if you have a heart problem that could be causing the pain.  An imaging scan to rule out a chest or rib fracture. How is this treated? This condition usually goes away on its own over time. Your health care provider may prescribe an NSAID to reduce pain and inflammation. Your health care provider may also suggest  that you:  Rest and avoid activities that make pain worse.  Apply heat or cold to the area to reduce pain and inflammation.  Do exercises to stretch your chest muscles. If these treatments do not help, your health care provider may inject a numbing medicine at the sternum-rib connection to help relieve the pain. Follow these instructions at home:  Avoid activities that make pain worse. This includes any activities that use chest, abdominal, and side muscles.  If directed, put ice on the painful area: ? Put ice in a plastic bag. ? Place a towel between your skin and the bag. ? Leave the ice on for 20 minutes, 2-3 times a day.  If directed, apply heat to the affected area as often as told by your health care provider. Use the heat source that your health care provider recommends, such as a moist heat pack or a heating pad.  ? Place a towel between your skin and the heat source. ? Leave the heat on for 20-30 minutes. ? Remove the heat if your skin turns bright red. This is especially important if you are unable to feel pain, heat, or cold. You may have a greater risk of getting burned.  Take over-the-counter and prescription medicines only as told by your health care provider.  Return to your normal activities as told by your health care provider. Ask your health care provider what activities are safe for you.  Keep all follow-up visits as told by your health care provider. This is important. Contact a health care provider if:  You have chills or a fever.  Your pain does not go away or it gets worse.  You have a cough that does not go away (is persistent). Get help right away if:  You have shortness of breath. This information is not intended to replace advice given to you by your health care provider. Make sure you discuss any questions you have with your health care provider. Document Released: 09/13/2005 Document Revised: 12/19/2017 Document Reviewed: 03/29/2016 Elsevier Patient Education  2020 [employer]

## 2019-06-26 NOTE — Progress Notes (Signed)
Virtual Visit  I connected with      Daliana Friebel  by a telemedicine application and verified that I am speaking with the correct person using two identifiers.   I discussed the limitations of evaluation and management by telemedicine and the availability of in person appointments. The patient expressed understanding and agreed to proceed.  History of Present Illness: Joanne Winters is a 53 y.o. female who would like to discuss atypical chest pain and ED follow-up. Pain started the night of 06/24/2019 and worsened throughout the next day. Pain became severe enough that she had to sit down. Describes pain as a tightness around the chest and sensitive to positional changes such as sitting up. Patient [facility] on 06/25/19. Evaluation was negative for MI.  Patient had work-up including negative EKG, troponin, d-dimer and negative chest x-ray.  GI cocktail provided relief for ~20 minutes. Pain has continued since and persists throughout the day. Pain is interfering with sleep.  She notes the pain is a little bit worse with chest wall motion.  She denies any exertional pain however.  She denies palpitation or shortness of breath associated with exertion.  Patient has been taking Protonix consistently. Patient has been taking AlevePM at night for pain. Recently started eating new foods with [employer] meals. Says that she has had more gas and bloating with the new foods.    Observations/Objective: Ht 5\' 5"  (1.651 m)   Wt 218 lb (98.9 kg)   BMI 36.28 kg/m  Wt Readings from Last 5 Encounters:  06/26/19 218 lb (98.9 kg)  05/09/19 224 lb 8 oz (101.8 kg)  04/16/19 225 lb (102.1 kg)  03/26/19 230 lb (104.3 kg)  02/28/19 230 lb (104.3 kg)   Exam: Normal Speech and affect.  Alert and oriented Not in apparent distress. Able to answer questions.  Lab and Radiology Results Lab results EKG report chest x-ray report and [facility].  Assessment and Plan: [age] y.o. female with Hx of GERD, PUD, Essential HTN, DVT, and Thyroid Goiter is following up on 2-day Hx of atypical chest pain described of tightness around the chest, more intense than previous GERD pain. Patient visited ED yesterday where evaluation suspected GERD exacerbation. Pain improved briefly with GI cocktail but has persisted since. Improvement with GI cocktail suggests GI cause.  Patient has been using NSAIDs consistently which raises the risk of gastritis/peptic ulcer disease.  Increased Protonix dose to 40 mg BID. Prescribed Sucralfate 1 g 4 times daily. Advised to stop AlevePM. Recommended Tylenol for pain management. Prescribed Celebrex 100 mg bid if Tylenol provides insufficient pain relief. Referred to Cardiology for stress test.  Patient is slightly increased risk for CAD and will likely benefit from further risk factor stratification.  Referred to GI for further evaluation. Did not recommend for COVID-19 testing due to chest pain not being consistent with respiratory cause.     Orders Placed This Encounter  Procedures  . Ambulatory referral to Cardiology    Referral Priority:   Routine    Referral Type:   Consultation    Referral Reason:   Specialty Services Required    Requested Specialty:   Cardiology    Number of Visits Requested:   1  . Ambulatory referral to Gastroenterology    Referral Priority:   Routine    Referral Type:   Consultation    Referral Reason:   Specialty Services Required    Referred to Provider:  Cirigliano, Vito V, DO    Number of Visits Requested:   1   Meds ordered this encounter  Medications  . sucralfate (CARAFATE) 1 g tablet    Sig: Take 1 tablet (1 g total) by mouth 4 (four) times daily.    Dispense:  120 tablet    Refill:  0  . pantoprazole (PROTONIX) 40 MG tablet    Sig: Take 1 tablet (40 mg total) by mouth 2 (two) times daily.    Dispense:  180 tablet    Refill:  3  . celecoxib (CELEBREX) 100 MG capsule     Sig: Take 1 capsule (100 mg total) by mouth 2 (two) times daily as needed for moderate pain.    Dispense:  60 capsule    Refill:  2    Follow Up Instructions:    I discussed the assessment and treatment plan with the patient. The patient was provided an opportunity to ask questions and all were answered. The patient agreed with the plan and demonstrated an understanding of the instructions.   The patient was advised to call back or seek an in-person evaluation if the symptoms worsen or if the condition fails to improve as anticipated.  Time: 25 minutes of intraservice time, with >39 minutes of total time during today's visit.      Historical information moved to improve visibility of documentation.  Past Medical History:  Diagnosis Date  . Essential hypertension 12/06/2015  . GERD (gastroesophageal reflux disease)   . Morbid obesity ([place]) 12/22/2014  . Seasonal allergies    Past Surgical History:  Procedure Laterality Date  . ABLATION  2013  . CESAREAN SECTION    . gall stone  1995   Social History   Tobacco Use  . Smoking status: Never Smoker  . Smokeless tobacco: Never Used  Substance Use Topics  . Alcohol use: Yes    Alcohol/week: 0.0 standard drinks    Comment: occ   family history includes Cancer in her maternal grandmother and paternal grandmother; Cancer - Colon in her father; Hypertension in her daughter, father, maternal grandmother, mother, and paternal grandmother.  Medications: Current Outpatient Medications  Medication Sig Dispense Refill  . hydrochlorothiazide (HYDRODIURIL) 25 MG tablet Take one tablet daily. 90 tablet 3  . pantoprazole (PROTONIX) 40 MG tablet Take 1 tablet (40 mg total) by mouth 2 (two) times daily. 180 tablet 3  . phentermine 15 MG capsule Take 1 capsule (15 mg total) by mouth every morning. 90 capsule 0  . topiramate (TOPAMAX) 50 MG tablet TAKE 1 TABLET BY MOUTH TWICE A DAY 60 tablet 0  . celecoxib (CELEBREX) 100 MG capsule Take 1  capsule (100 mg total) by mouth 2 (two) times daily as needed for moderate pain. 60 capsule 2  . sucralfate (CARAFATE) 1 g tablet Take 1 tablet (1 g total) by mouth 4 (four) times daily. 120 tablet 0   No current facility-administered medications for this visit.    Allergies  Allergen Reactions  . Hydrocodone Other (See Comments)  . Tramadol     nausea    I personally was present and performed or re-performed the history, physical exam and medical decision-making activities of this service and have verified that the service and findings are accurately documented in the student's note. ___________________________________________ Lynne Leader M.D., ABFM., CAQSM. Primary Care and Sports Medicine Adjunct Instructor of [employer]

## 2019-06-27 ENCOUNTER — Encounter: Payer: Self-pay | Admitting: Family Medicine

## 2019-06-30 ENCOUNTER — Encounter: Payer: Self-pay | Admitting: *Deleted

## 2019-06-30 ENCOUNTER — Encounter: Payer: Self-pay | Admitting: Cardiology

## 2019-06-30 ENCOUNTER — Other Ambulatory Visit: Payer: Self-pay

## 2019-06-30 ENCOUNTER — Ambulatory Visit (INDEPENDENT_AMBULATORY_CARE_PROVIDER_SITE_OTHER): Payer: 59 | Admitting: Cardiology

## 2019-06-30 VITALS — BP 118/68 | HR 80 | Ht 65.0 in | Wt 219.0 lb

## 2019-06-30 DIAGNOSIS — I10 Essential (primary) hypertension: Secondary | ICD-10-CM | POA: Diagnosis not present

## 2019-06-30 DIAGNOSIS — R0789 Other chest pain: Secondary | ICD-10-CM | POA: Diagnosis not present

## 2019-06-30 DIAGNOSIS — R079 Chest pain, unspecified: Secondary | ICD-10-CM | POA: Diagnosis not present

## 2019-06-30 DIAGNOSIS — N920 Excessive and frequent menstruation with regular cycle: Secondary | ICD-10-CM | POA: Insufficient documentation

## 2019-06-30 NOTE — Addendum Note (Signed)
Addended by: Particia Nearing B on: 06/30/2019 11:35 AM   Modules accepted: Orders

## 2019-06-30 NOTE — Progress Notes (Signed)
Cardiology Office Note:    Date:  06/30/2019   ID:  Joanne Winters, DOB 1966-01-28, MRN [id number]  PCP:  Donella Stade, PA-C  Cardiologist:  Jenean Lindau, MD   Referring MD: Donella Stade, PA-C    ASSESSMENT:    1. Chest discomfort   2. Essential hypertension    PLAN:    In order of problems listed above:  1. Chest discomfort: These symptoms are atypical for coronary etiology.  Patient has risk factors for coronary artery disease and therefore we will perform a Lexiscan sestamibi to reassure her.  Also exercise does not bring around any symptoms and so my level of suspicion is moderate in this lady.  She knows to go to the nearest emergency room for any concerning symptoms. 2. Essential hypertension: Blood pressure stable and is monitored by her primary care physician. 3. Patient will be seen in follow-up appointment in 3 months or earlier if the patient has any concerns    Medication Adjustments/Labs and Tests Ordered: Current medicines are reviewed at length with the patient today.  Concerns regarding medicines are outlined above.  No orders of the defined types were placed in this encounter.  No orders of the defined types were placed in this encounter.    History of Present Illness:    Joanne Winters is a 53 y.o. female who is being seen today for the evaluation of chest discomfort at the request of Alden Hipp, Jade L, PA-C.  Patient has past medical history of essential hypertension.  She mentions to me that she experienced chest discomfort and went to [facility] emergency room and was treated and released.  Subsequently she has felt fine.  She is an active lady at work and also otherwise.  She tells me that after this hospital discharge she went for a long walk without any significant symptoms.  She was sent here by her primary care physician to be evaluated for chest discomfort.  No radiation to the neck or to the arms.  At the time of my evaluation, the patient is  alert awake oriented and in no distress.  Past Medical History:  Diagnosis Date   Essential hypertension 12/06/2015   GERD (gastroesophageal reflux disease)    Morbid obesity ([place]) 12/22/2014   Seasonal allergies     Past Surgical History:  Procedure Laterality Date   ABLATION  2013   CESAREAN SECTION     gall stone  1995    Current Medications: Current Meds  Medication Sig   celecoxib (CELEBREX) 100 MG capsule Take 1 capsule (100 mg total) by mouth 2 (two) times daily as needed for moderate pain.   hydrochlorothiazide (HYDRODIURIL) 25 MG tablet Take one tablet daily.   pantoprazole (PROTONIX) 40 MG tablet Take 1 tablet (40 mg total) by mouth 2 (two) times daily.     Allergies:   Hydrocodone and Tramadol   Social History   Socioeconomic History   Marital status: Married    Spouse name: Not on file   Number of children: Not on file   Years of education: Not on file   Highest education level: Not on file  Occupational History   Not on file  Social Needs   Financial resource strain: Not on file   Food insecurity    Worry: Not on file    Inability: Not on file   Transportation needs    Medical: Not on file    Non-medical: Not on file  Tobacco Use   Smoking status:  Never Smoker   Smokeless tobacco: Never Used  Substance and Sexual Activity   Alcohol use: Yes    Alcohol/week: 0.0 standard drinks    Comment: occ   Drug use: No   Sexual activity: Not on file  Lifestyle   Physical activity    Days per week: Not on file    Minutes per session: Not on file   Stress: Not on file  Relationships   Social connections    Talks on phone: Not on file    Gets together: Not on file    Attends religious service: Not on file    Active member of club or organization: Not on file    Attends meetings of clubs or organizations: Not on file    Relationship status: Not on file  Other Topics Concern   Not on file  Social History Narrative   Not on  file     Family History: The patient's family history includes Cancer in her maternal grandmother and paternal grandmother; Cancer - Colon in her father; Hypertension in her daughter, father, maternal grandmother, mother, and paternal grandmother.  ROS:   Please see the history of present illness.    All other systems reviewed and are negative.  EKGs/Labs/Other Studies Reviewed:    The following studies were reviewed today: EKG reveals sinus rhythm and nonspecific ST-T changes.   Recent Labs: 07/05/2018: ALT 12; BUN 12; Creat 0.88; Hemoglobin 11.7; Platelets 256; Potassium 4.3; Sodium 137; TSH 1.25  Recent Lipid Panel    Component Value Date/Time   CHOL 180 07/05/2018 1528   TRIG 75 07/05/2018 1528   HDL 57 07/05/2018 1528   CHOLHDL 3.2 07/05/2018 1528   VLDL 15 07/20/2016 1107   LDLCALC 107 (H) 07/05/2018 1528    Physical Exam:    VS:  BP 118/68 (BP Location: Left Arm, Patient Position: Sitting, Cuff Size: Normal)    Pulse 80    Ht 5\' 5"  (1.651 m)    Wt 219 lb (99.3 kg)    SpO2 98%    BMI 36.44 kg/m     Wt Readings from Last 3 Encounters:  06/30/19 219 lb (99.3 kg)  06/26/19 218 lb (98.9 kg)  05/09/19 224 lb 8 oz (101.8 kg)     GEN: Patient is in no acute distress HEENT: Normal NECK: No JVD; No carotid bruits LYMPHATICS: No lymphadenopathy CARDIAC: S1 S2 regular, 2/6 systolic murmur at the apex. RESPIRATORY:  Clear to auscultation without rales, wheezing or rhonchi  ABDOMEN: Soft, non-tender, non-distended MUSCULOSKELETAL:  No edema; No deformity  SKIN: Warm and dry NEUROLOGIC:  Alert and oriented x 3 PSYCHIATRIC:  Normal affect    Signed, Garwin Brothersajan R Betzy Barbier, MD  06/30/2019 11:21 AM     [facility]

## 2019-06-30 NOTE — Patient Instructions (Signed)
Medication Instructions:  Your physician recommends that you continue on your current medications as directed. Please refer to the Current Medication list given to you today.  If you need a refill on your cardiac medications before your next appointment, please call your pharmacy.   Lab work: None If you have labs (blood work) drawn today and your tests are completely normal, you will receive your results only by: [name] Kitchen MyChart Message (if you have MyChart) OR . A paper copy in the mail If you have any lab test that is abnormal or we need to change your treatment, we will call you to review the results.  Testing/Procedures: Your physician has requested that you have an echocardiogram. Echocardiography is a painless test that uses sound waves to create images of your heart. It provides your doctor with information about the size and shape of your heart and how well your heart's chambers and valves are working. This procedure takes approximately one hour. There are no restrictions for this procedure.  Your physician has requested that you have a lexiscan myoview. For further information please visit [email or web address]. Please follow instruction sheet, as given.   Follow-Up: At [facility], you and your health needs are our priority.  As part of our continuing mission to provide you with exceptional heart care, we have created designated Provider Care Teams.  These Care Teams include your primary Cardiologist (physician) and Advanced Practice Providers (APPs -  Physician Assistants and Nurse Practitioners) who all work together to provide you with the care you need, when you need it. You will need a follow up appointment in 3 months. Any Other Special Instructions Will Be Listed Below (If Applicable).

## 2019-07-09 ENCOUNTER — Telehealth (HOSPITAL_COMMUNITY): Payer: Self-pay | Admitting: *Deleted

## 2019-07-09 NOTE — Telephone Encounter (Signed)
Left message on voicemail per DPR in reference to upcoming appointment scheduled on 07/11/19 with detailed instructions given per Myocardial Perfusion Study Information Sheet for the test. LM to arrive 15 minutes early, and that it is imperative to arrive on time for appointment to keep from having the test rescheduled. If you need to cancel or reschedule your appointment, please call the office within 24 hours of your appointment. Failure to do so may result in a cancellation of your appointment, and a $50 no show fee. Phone number given for call back for any questions. Kolby Schara Jacqueline    

## 2019-07-11 ENCOUNTER — Telehealth: Payer: Self-pay

## 2019-07-11 ENCOUNTER — Ambulatory Visit (HOSPITAL_BASED_OUTPATIENT_CLINIC_OR_DEPARTMENT_OTHER): Payer: 59

## 2019-07-11 ENCOUNTER — Other Ambulatory Visit: Payer: Self-pay

## 2019-07-11 ENCOUNTER — Ambulatory Visit (HOSPITAL_BASED_OUTPATIENT_CLINIC_OR_DEPARTMENT_OTHER)
Admission: RE | Admit: 2019-07-11 | Discharge: 2019-07-11 | Disposition: A | Discharge status: Home / Self Care | Payer: 59 | Source: Ambulatory Visit | Attending: Cardiology | Admitting: Cardiology

## 2019-07-11 VITALS — Ht 65.0 in | Wt 219.0 lb

## 2019-07-11 DIAGNOSIS — R079 Chest pain, unspecified: Secondary | ICD-10-CM

## 2019-07-11 DIAGNOSIS — R0789 Other chest pain: Secondary | ICD-10-CM | POA: Insufficient documentation

## 2019-07-11 LAB — MYOCARDIAL PERFUSION IMAGING
LV dias vol: 76 mL (ref 46–106)
LV sys vol: 28 mL
Peak HR: 96 {beats}/min
Rest HR: 70 {beats}/min
SDS: 0
SRS: 0
SSS: 0
TID: 1.1

## 2019-07-11 MED ORDER — TECHNETIUM TC 99M TETROFOSMIN IV KIT
11.0000 | PACK | Freq: Once | INTRAVENOUS | Status: AC | PRN
  Administered 2019-07-11: 11 via INTRAVENOUS
  Filled 2019-07-11: qty 11

## 2019-07-11 MED ORDER — TECHNETIUM TC 99M TETROFOSMIN IV KIT
32.6000 | PACK | Freq: Once | INTRAVENOUS | Status: AC | PRN
  Administered 2019-07-11: 32.6 via INTRAVENOUS
  Filled 2019-07-11: qty 33

## 2019-07-11 MED ORDER — REGADENOSON 0.4 MG/5ML IV SOLN
0.4000 mg | Freq: Once | INTRAVENOUS | Status: AC
  Administered 2019-07-11: 0.4 mg via INTRAVENOUS

## 2019-07-11 NOTE — Telephone Encounter (Signed)
Results relayed for echo and lexi, copy sent to Dr. Alden Hipp per Dr. Docia Furl.

## 2019-07-11 NOTE — Progress Notes (Signed)
  Echocardiogram 2D Echocardiogram has been performed.   Cardell Peach  07/11/2019, 10:09 AM

## 2019-07-11 NOTE — Telephone Encounter (Signed)
-----   Message from Jenean Lindau, MD sent at 07/11/2019  1:00 PM EDT ----- The results of the study is unremarkable. Please inform patient. I will discuss in detail at next appointment. Cc  primary care/referring physician Jenean Lindau, MD 07/11/2019 1:00 PM

## 2019-07-18 ENCOUNTER — Other Ambulatory Visit: Payer: Self-pay | Admitting: Physician Assistant

## 2019-07-18 ENCOUNTER — Other Ambulatory Visit: Payer: Self-pay | Admitting: Family Medicine

## 2019-07-18 DIAGNOSIS — E6609 Other obesity due to excess calories: Secondary | ICD-10-CM

## 2019-07-18 DIAGNOSIS — Z6837 Body mass index (BMI) 37.0-37.9, adult: Secondary | ICD-10-CM

## 2019-07-18 NOTE — Telephone Encounter (Signed)
Can we call patient and see how she is doing and if she still needs refills. Usually have a month can stop taking.

## 2019-08-19 ENCOUNTER — Telehealth: Payer: Self-pay | Admitting: Neurology

## 2019-08-19 NOTE — Telephone Encounter (Signed)
Patient left vm asking about her life insurance forms that were sent in May.   I called and LMOM letting her know I don't see any requests for information and asked her to have them resend any paperwork to [place] and call back if needed.

## 2019-08-20 ENCOUNTER — Encounter: Payer: Self-pay | Admitting: Physician Assistant

## 2019-08-21 NOTE — Telephone Encounter (Signed)
I have not seen this. I am totally ok with releasing records. Are we supposed to have gotten it yet?

## 2019-08-21 NOTE — Telephone Encounter (Signed)
She also left a vm about this and I called her back letting her know we have not received it and gave her our fax number.

## 2019-09-10 ENCOUNTER — Telehealth: Payer: Self-pay | Admitting: Neurology

## 2019-09-10 NOTE — Telephone Encounter (Signed)
Thanks

## 2019-09-10 NOTE — Telephone Encounter (Signed)
Patient left vm stating the blue surgical masks required for her job are causing her to have skin breakouts. She states if she gets a letter from PCP that they will let her use a face shield. Letter written. Left message on machine for patient letting her know this is available to print from mychart or let us know if she needs a printed copy.  [place].

## 2019-09-18 ENCOUNTER — Encounter: Payer: Self-pay | Admitting: Physician Assistant

## 2019-09-19 ENCOUNTER — Telehealth: Payer: Self-pay | Admitting: Physician Assistant

## 2019-09-19 MED ORDER — ALPRAZOLAM 0.5 MG PO TABS: ORAL_TABLET | ORAL | 0 refills | Status: DC

## 2019-09-19 NOTE — Telephone Encounter (Signed)
Sent electronically 

## 2019-09-19 NOTE — Telephone Encounter (Signed)
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

## 2019-09-19 NOTE — Telephone Encounter (Signed)
pended

## 2019-10-06 ENCOUNTER — Ambulatory Visit: Payer: 59 | Admitting: Cardiology

## 2019-10-14 ENCOUNTER — Ambulatory Visit: Payer: 59 | Admitting: Cardiology

## 2019-10-24 IMAGING — US US EXTREM LOW VENOUS*L*
1 series · 13 of 24 positions shown · non-contrast
Comparison: None.

CLINICAL DATA: 51-year-old female with progressive left lower
extremity pain and swelling after falling 5 days ago.



[Series 1: us extrem low venous*left* · 0.08mm/px · 13 of 37 slices shown]
[im 1/37]
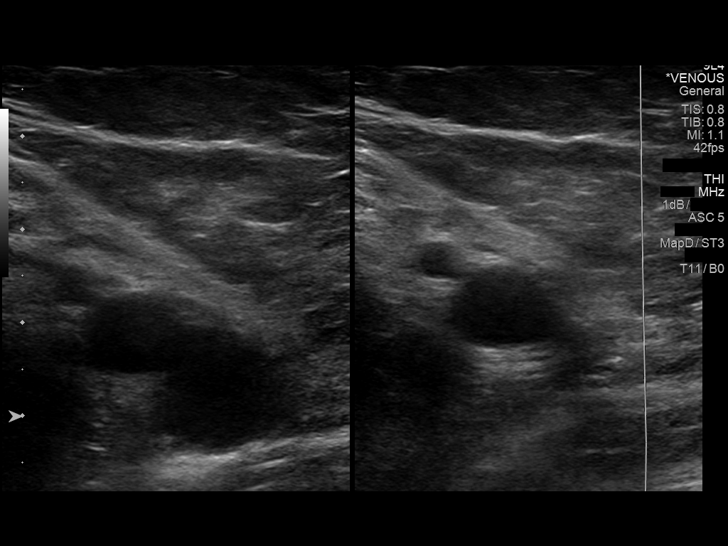
[im 4/37]
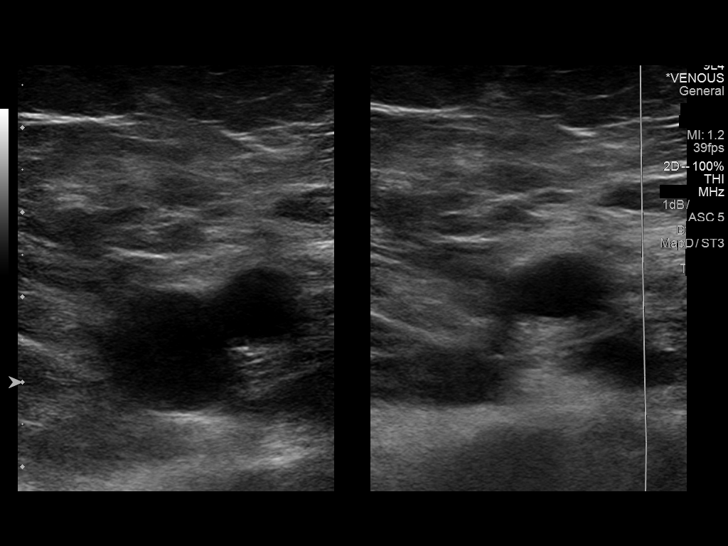
[im 7/37]
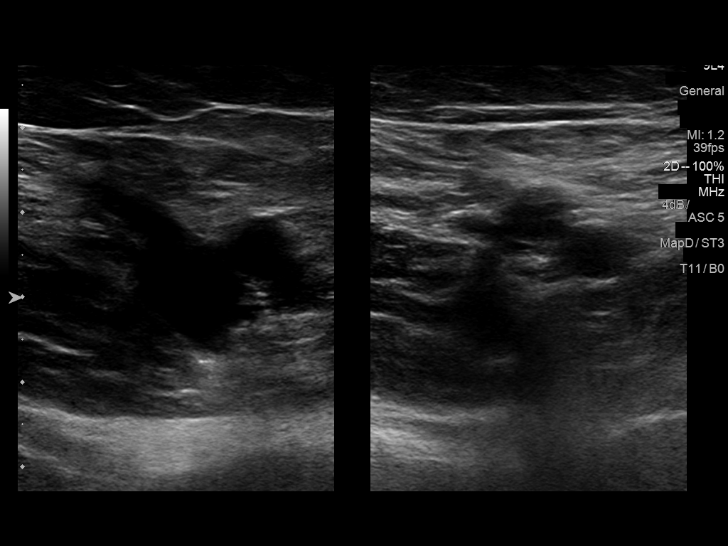
[im 10/37]
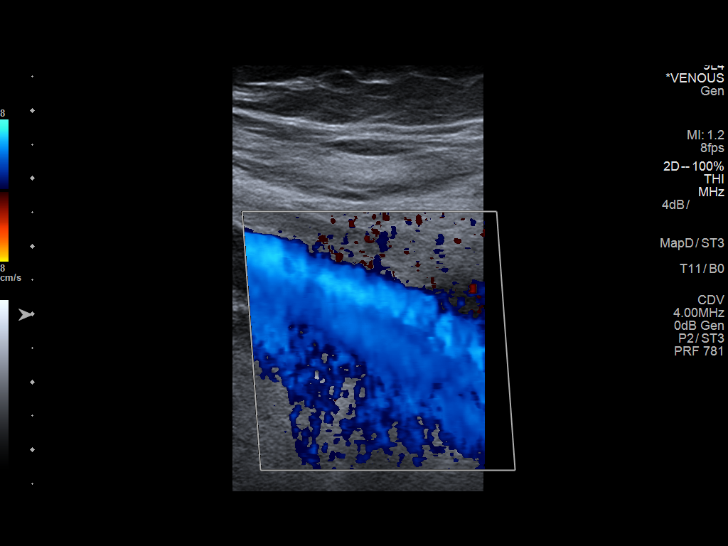
[im 13/37]
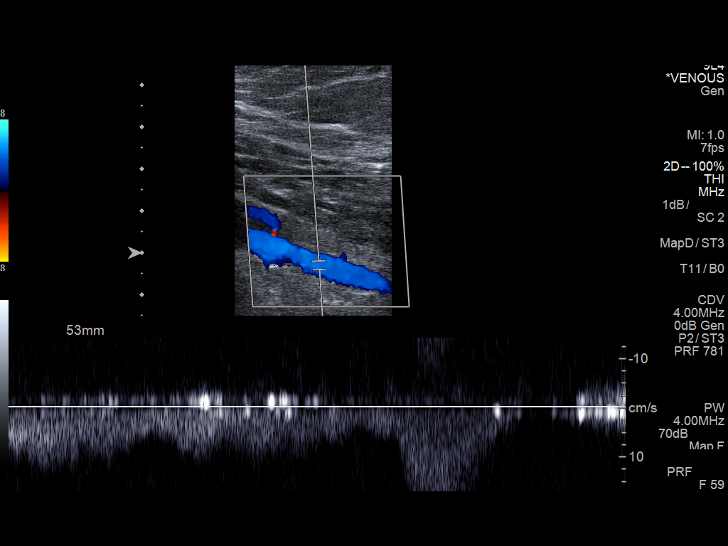
[im 16/37]
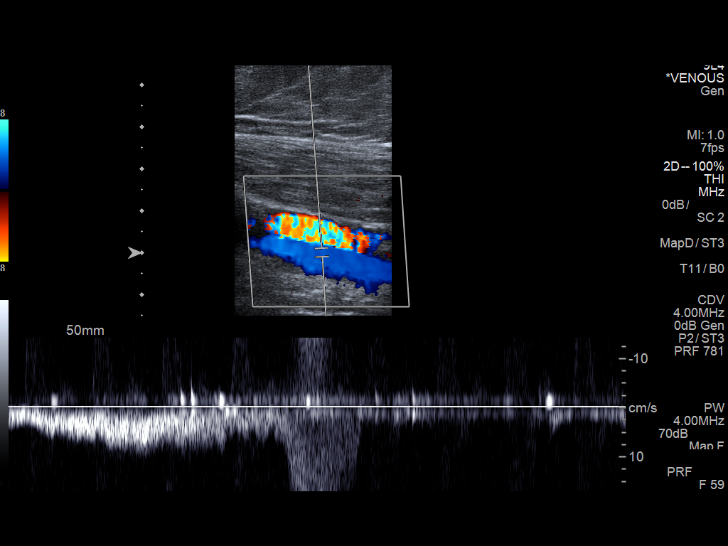
[im 19/37]
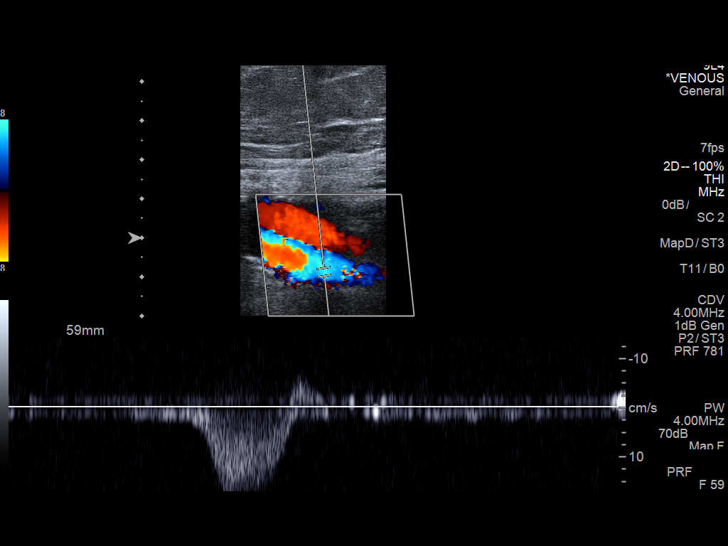
[im 21/37]
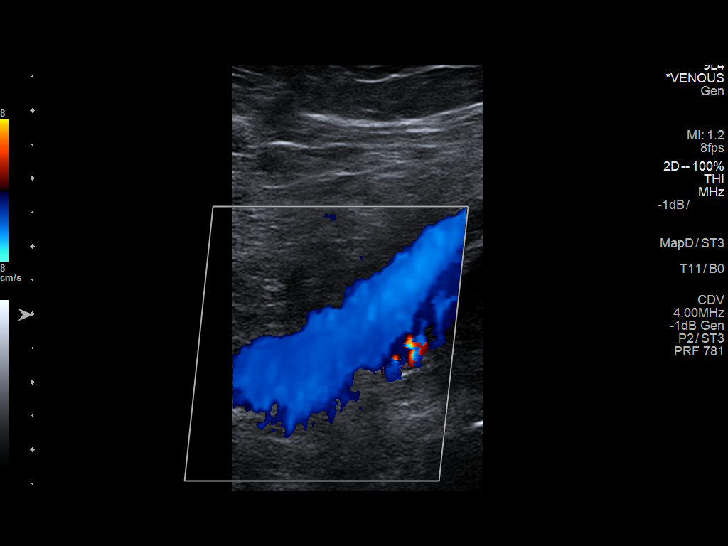
[im 24/37]
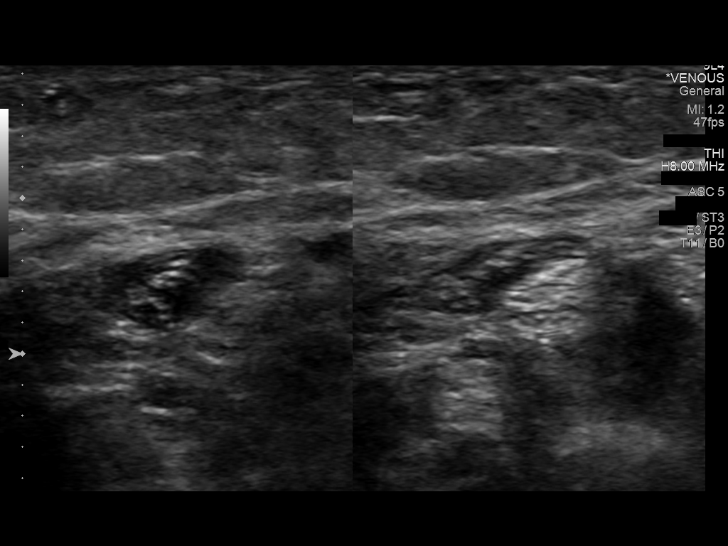
[im 27/37]
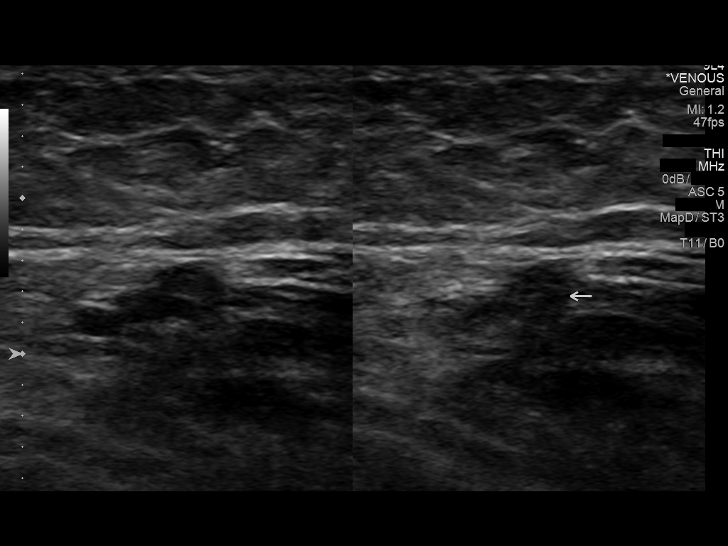
[im 30/37]
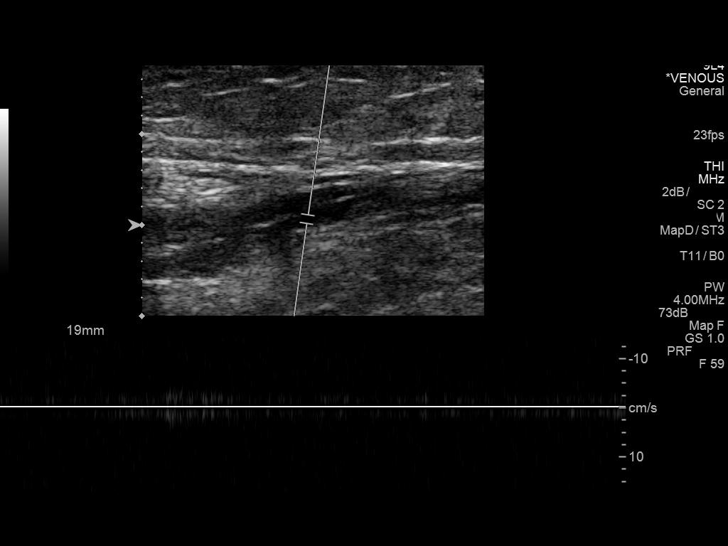
[im 33/37]
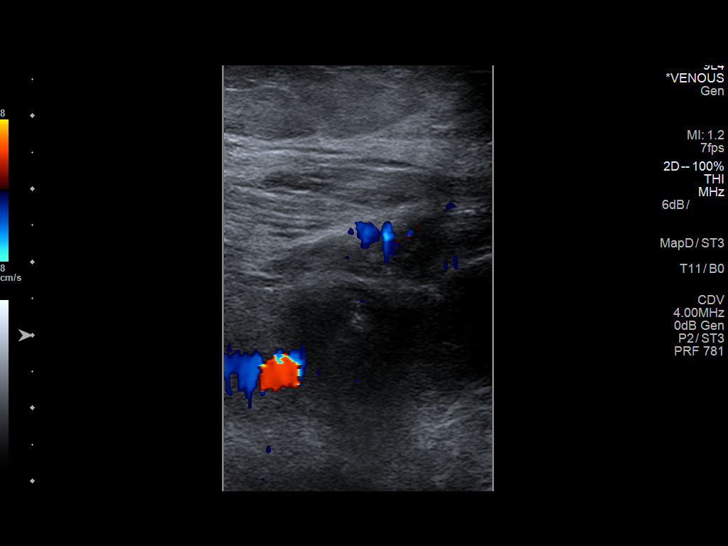
[im 37/37]
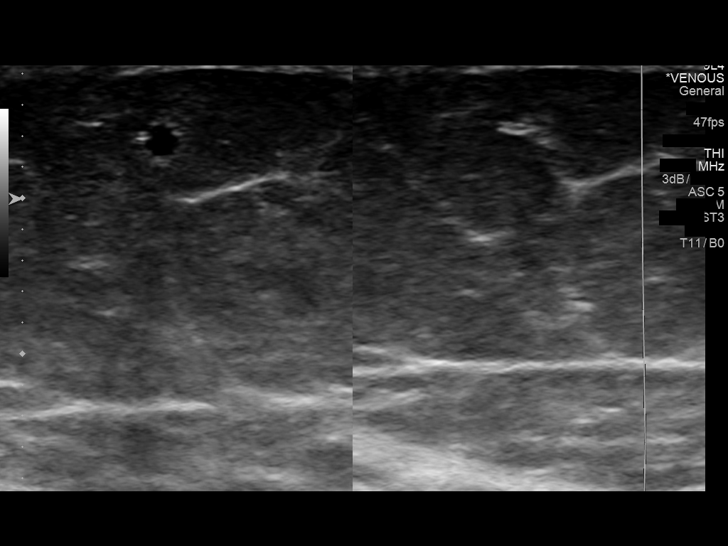

[13 of 24 positions shown; findings below may reference images not displayed]

FINDINGS: Contralateral Common Femoral Vein: Respiratory phasicity is normal
and symmetric with the symptomatic side. No evidence of thrombus.
Normal compressibility.

Common Femoral Vein: No evidence of thrombus. Normal
compressibility, respiratory phasicity and response to augmentation.

Saphenofemoral Junction: No evidence of thrombus. Normal
compressibility and flow on color Doppler imaging.

Profunda Femoral Vein: No evidence of thrombus. Normal
compressibility and flow on color Doppler imaging.

Femoral Vein: No evidence of thrombus. Normal compressibility,
respiratory phasicity and response to augmentation.

Popliteal Vein: No evidence of thrombus. Normal compressibility,
respiratory phasicity and response to augmentation.

Calf Veins: No evidence of thrombus within the proximal calf veins.
However, at the level of the ankle, 1 of the paired posterior tibial
veins does not compress and is expanded with low-level internal
echoes. No evidence of flow on color Doppler imaging.

Superficial Great Saphenous Vein: No evidence of thrombus. Normal
compressibility.

Venous Reflux:  None.

Other Findings:  None.
IMPRESSION: Positive for isolated calf DVT involving 1 of the paired posterior
tibial veins at the ankle.

These results will be called to the ordering clinician or
representative by the Radiologist Assistant, and communication
documented in the PACS or zVision Dashboard.

## 2020-01-20 IMAGING — US US EXTREM LOW VENOUS*L*
1 series · 14 of 24 positions shown · non-contrast
Comparison: 08/15/2018

CLINICAL DATA: History of left lower extremity DVT. Preop knee
surgery.

EXAM:
LEFT LOWER EXTREMITY VENOUS DOPPLER ULTRASOUND
TECHNIQUE: Gray-scale sonography with compression, as well as color and duplex
ultrasound, were performed to evaluate the deep venous system from
the level of the common femoral vein through the popliteal and
proximal calf veins.

[Series 1: us extrem low venous*left* · 0.08mm/px · 14 of 31 slices shown]
[im 1/31]
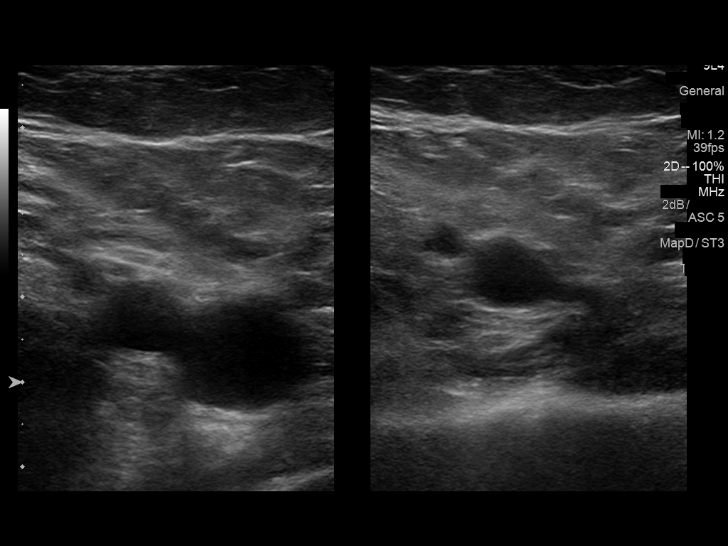
[im 3/31]
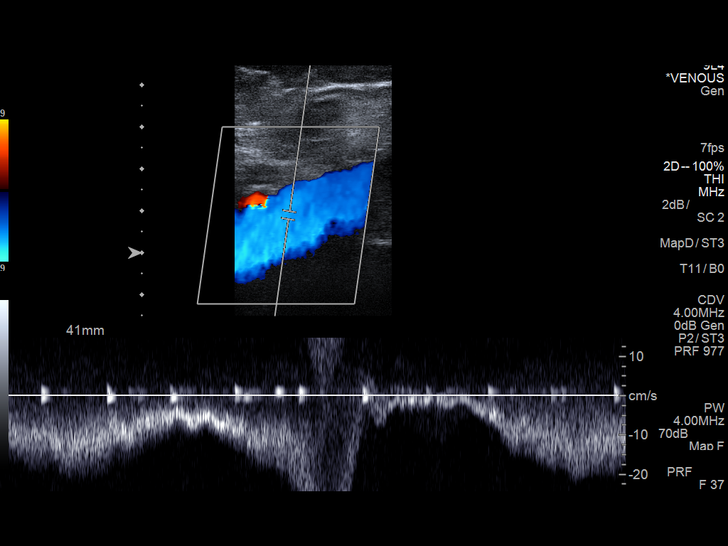
[im 6/31]
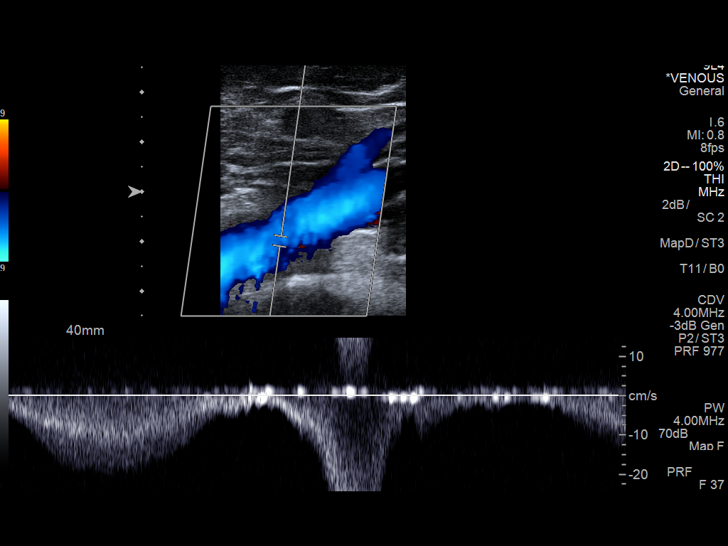
[im 8/31]
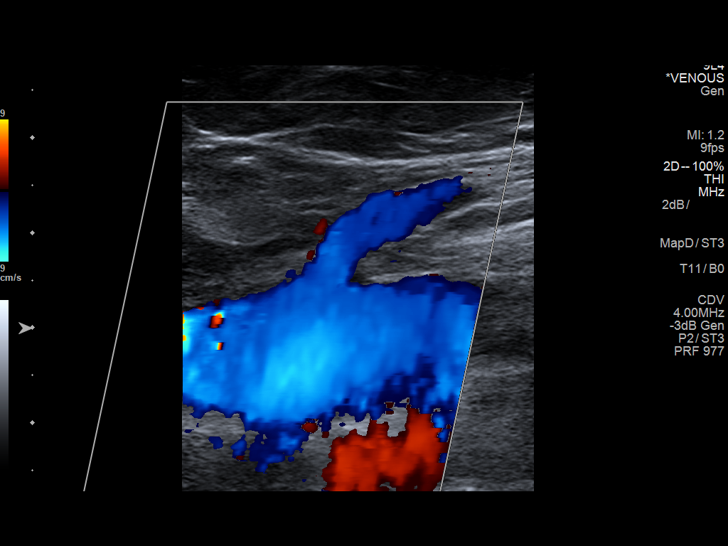
[im 10/31]
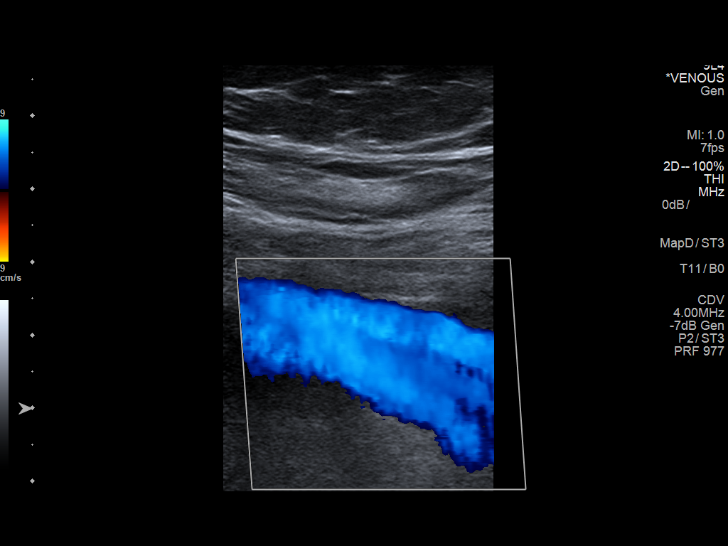
[im 12/31]
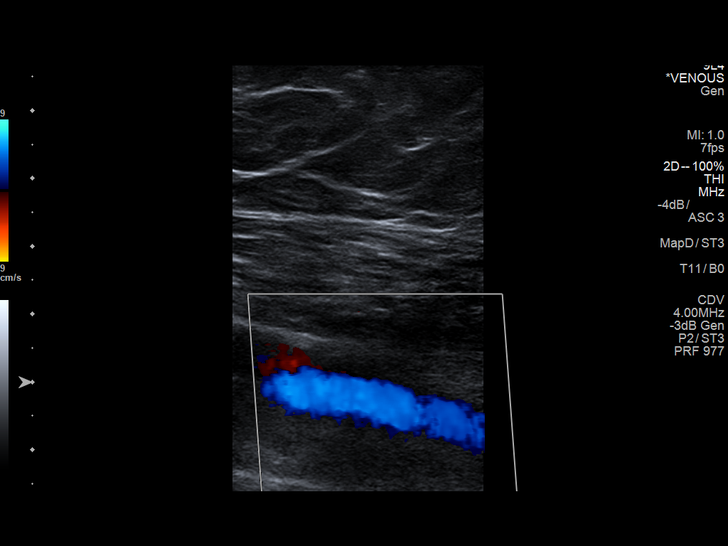
[im 15/31]
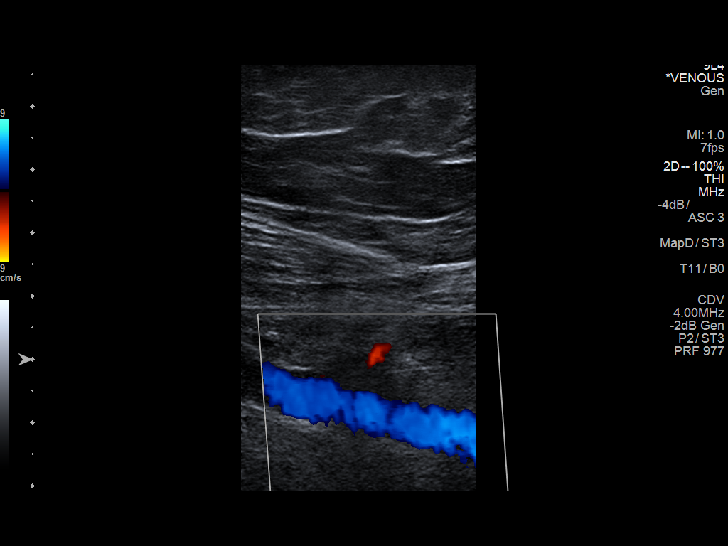
[im 16/31]
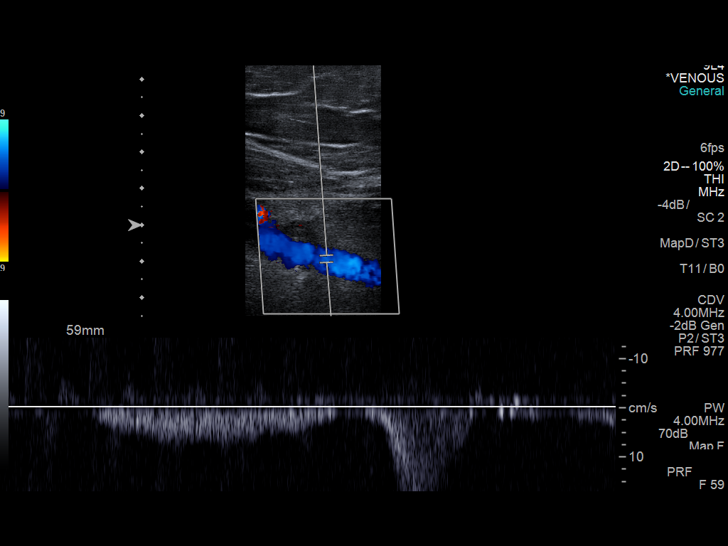
[im 19/31]
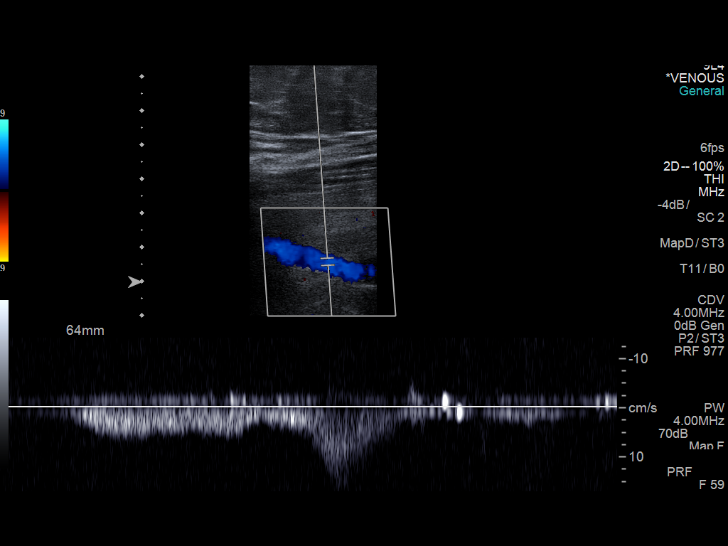
[im 21/31]
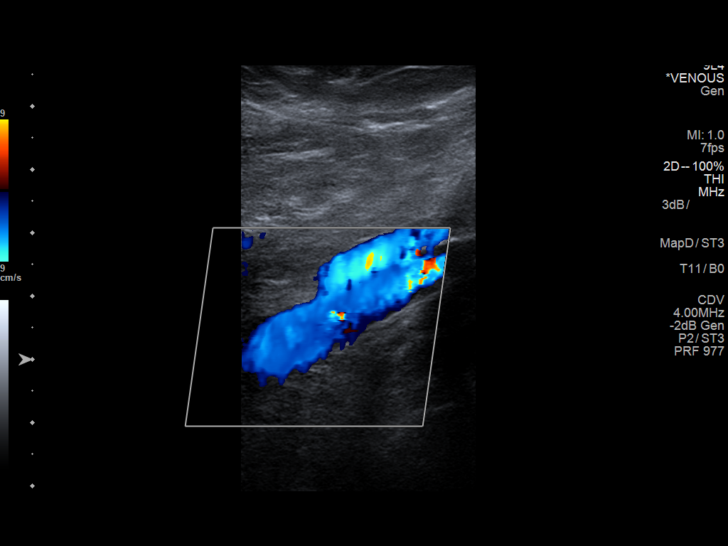
[im 24/31]
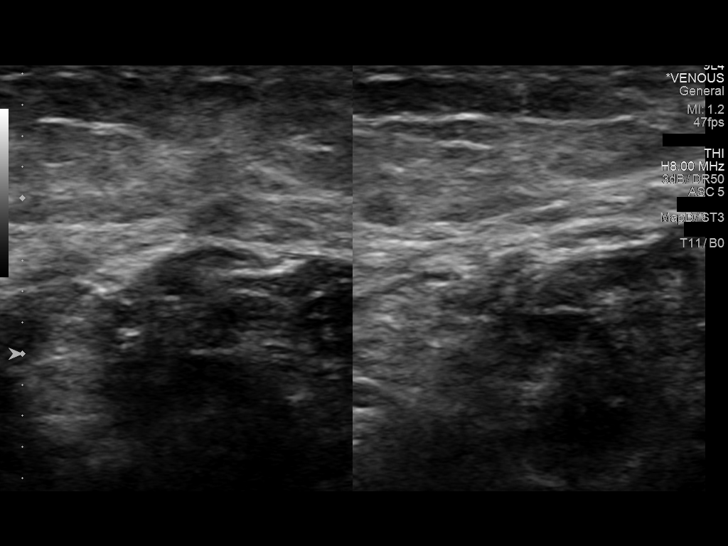
[im 25/31]
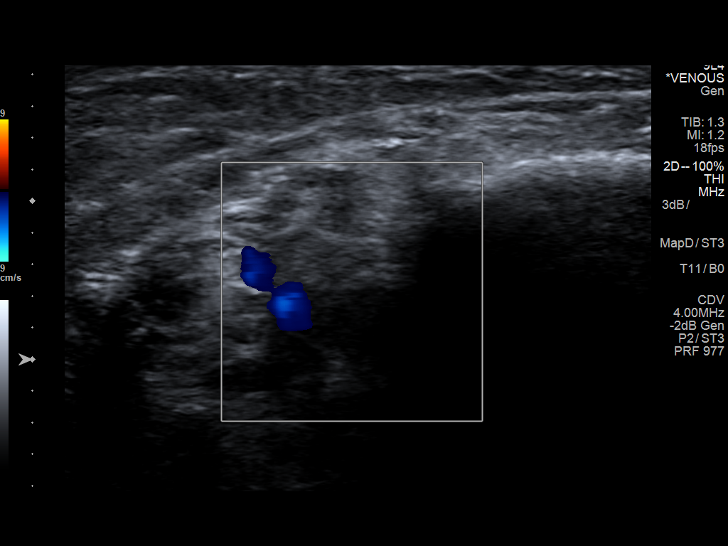
[im 28/31]
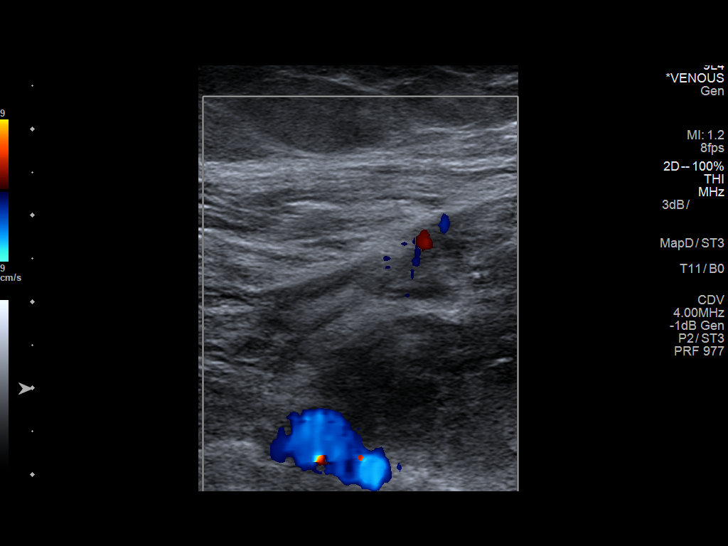
[im 31/31]
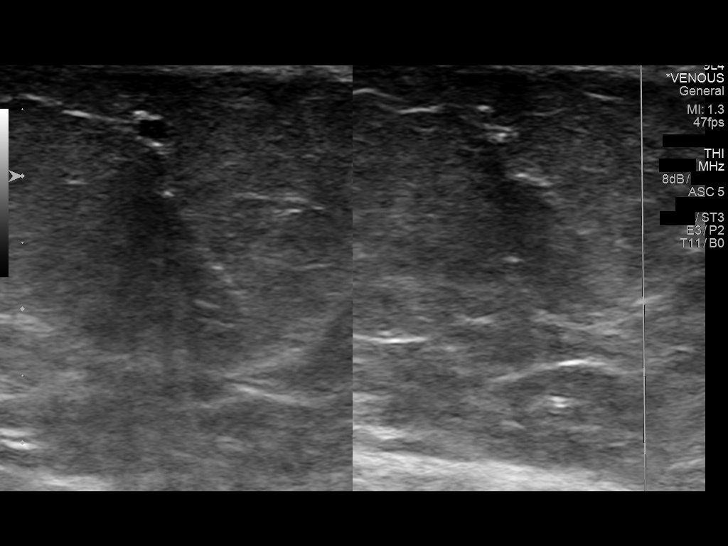

[14 of 24 positions shown; findings below may reference images not displayed]

FINDINGS: Normal compressibility of the common femoral, superficial femoral,
and popliteal veins, as well as the proximal calf veins. No filling
defects to suggest DVT on grayscale or color Doppler imaging. The
posterior tibial DVT seen previously has apparently resolved.
Doppler waveforms show normal direction of venous flow, normal
respiratory phasicity and response to augmentation. Visualized
segments of the saphenous venous system normal in caliber and
compressibility. Survey views of the contralateral common femoral
vein are unremarkable.
IMPRESSION: No evidence of left lower extremity deep vein thrombosis.

## 2020-04-09 ENCOUNTER — Encounter: Payer: Self-pay | Admitting: Medical-Surgical

## 2020-04-09 ENCOUNTER — Ambulatory Visit: Payer: 59 | Admitting: Medical-Surgical

## 2020-04-09 ENCOUNTER — Other Ambulatory Visit: Payer: Self-pay

## 2020-04-09 VITALS — BP 143/85 | HR 77 | Temp 97.8°F | Ht 65.0 in | Wt 215.0 lb

## 2020-04-09 DIAGNOSIS — N939 Abnormal uterine and vaginal bleeding, unspecified: Secondary | ICD-10-CM

## 2020-04-09 DIAGNOSIS — Z1231 Encounter for screening mammogram for malignant neoplasm of breast: Secondary | ICD-10-CM

## 2020-04-09 NOTE — Progress Notes (Signed)
Subjective:    CC: Abnormal uterine bleeding  HPI: Very pleasant 54 year old female presenting today for discussion about abnormal uterine bleeding.  She had uterine ablation done approximately 9 years ago followed by approximately 12 months of light pink menstrual bleeding and then cessation of menses.  On 2020-04-05 she abruptly started with heavy menstrual bleeding, bright red blood for 3 days which then turned dark red.  On day 1 and 2, changed pads approximately every hour with day 3 requiring every 2 hour pad  changes.  No vaginal bleeding today.  Reports back pain at onset of bleeding, abdominal pressure in the lower abdomen with a feeling of pressure/pain in the vagina as if something is pressing on it.   No other vaginal discharge or odor, urinary symptoms.  Denies shortness of breath, chest pain, palpitations, headaches, lightheadedness/dizziness.  Unreliable age of menopause in female relatives as her mother had a hysterectomy and her grandmother reportedly went through menopause at age 12.  Does endorse some intermittent hot flashes earlier this year but these have resolved.  Not currently sexually active, female partner, denies penetration with sexual toys.  Overdue for mammogram.  Would like to have that order placed today.  I reviewed the past medical history, family history, social history, surgical history, and allergies today and no changes were needed.  Please see the problem list section below in epic for further details.  Past Medical History: Past Medical History:  Diagnosis Date  . Essential hypertension 12/06/2015  . GERD (gastroesophageal reflux disease)   . Morbid obesity (HCC) 12/22/2014  . Seasonal allergies    Past Surgical History: Past Surgical History:  Procedure Laterality Date  . ABLATION  2013  . CESAREAN SECTION    . gall stone  1995   Social History: Social History   Socioeconomic History  . Marital status: Married    Spouse name: Not on file  .  Number of children: Not on file  . Years of education: Not on file  . Highest education level: Not on file  Occupational History  . Not on file  Tobacco Use  . Smoking status: Never Smoker  . Smokeless tobacco: Never Used  Substance and Sexual Activity  . Alcohol use: Yes    Alcohol/week: 0.0 standard drinks    Comment: occ  . Drug use: No  . Sexual activity: Not on file  Other Topics Concern  . Not on file  Social History Narrative  . Not on file   Social Determinants of Health   Financial Resource Strain:   . Difficulty of Paying Living Expenses:   Food Insecurity:   . Worried About Programme researcher, broadcasting/film/video in the Last Year:   . Barista in the Last Year:   Transportation Needs:   . Freight forwarder (Medical):   [name] Kitchen Lack of Transportation (Non-Medical):   Physical Activity:   . Days of Exercise per Week:   . Minutes of Exercise per Session:   Stress:   . Feeling of Stress :   Social Connections:   . Frequency of Communication with Friends and Family:   . Frequency of Social Gatherings with Friends and Family:   . Attends Religious Services:   . Active Member of Clubs or Organizations:   . Attends Banker Meetings:   [name] Kitchen Marital Status:    Family History: Family History  Problem Relation Age of Onset  . Hypertension Mother   . Hypertension Father   . Cancer -  Colon Father   . Hypertension Daughter   . Cancer Maternal Grandmother        breast  . Hypertension Maternal Grandmother   . Cancer Paternal Grandmother        breast  . Hypertension Paternal Grandmother    Allergies: Allergies  Allergen Reactions  . Hydrocodone Other (See Comments)  . Tramadol     nausea   Medications: See med rec.  Review of Systems: No fevers, chills, night sweats, weight loss, chest pain, or shortness of breath.   Objective:    General: Well Developed, well nourished, and in no acute distress.  Neuro: Alert and oriented x3.  HEENT: Normocephalic,  atraumatic.  Skin: Warm and dry. Cardiac: Regular rate and rhythm, no murmurs rubs or gallops, no lower extremity edema.  Respiratory: Clear to auscultation bilaterally. Not using accessory muscles, speaking in full sentences.   Impression and Recommendations:    1. Abnormal uterine bleeding Checking CBC.  Also checking FSH/LH to evaluate for menopause.  Will obtain a transvaginal ultrasound.  Referring to gynecology for further evaluation and possible endometrial biopsy. - US Transvaginal Non-OB; Future - Ambulatory referral to Gynecology - FSH/LH - CBC  2. Encounter for screening mammogram for malignant neoplasm of breast Mammogram ordered. - MM 3D SCREEN BREAST BILATERAL  Return if symptoms worsen or fail to improve. ___________________________________________ Clearnce Sorrel, DNP, APRN, FNP-BC [facility] [place]

## 2020-04-10 LAB — CBC
HCT: 40.1 % (ref 35.0–45.0)
Hemoglobin: 12.9 g/dL (ref 11.7–15.5)
MCH: 25.7 pg — ABNORMAL LOW (ref 27.0–33.0)
MCHC: 32.2 g/dL (ref 32.0–36.0)
MCV: 79.9 fL — ABNORMAL LOW (ref 80.0–100.0)
MPV: 10.4 fL (ref 7.5–12.5)
Platelets: 282 10*3/uL (ref 140–400)
RBC: 5.02 10*6/uL (ref 3.80–5.10)
RDW: 13.8 % (ref 11.0–15.0)
WBC: 10.9 10*3/uL — ABNORMAL HIGH (ref 3.8–10.8)

## 2020-04-10 LAB — FSH/LH
FSH: 24.4 m[IU]/mL
LH: 10.2 m[IU]/mL

## 2020-04-12 ENCOUNTER — Ambulatory Visit: Payer: 59 | Admitting: Medical-Surgical

## 2020-04-21 ENCOUNTER — Ambulatory Visit: Payer: 59

## 2020-04-22 ENCOUNTER — Ambulatory Visit (INDEPENDENT_AMBULATORY_CARE_PROVIDER_SITE_OTHER): Payer: 59

## 2020-04-22 ENCOUNTER — Other Ambulatory Visit: Payer: Self-pay

## 2020-04-22 DIAGNOSIS — N939 Abnormal uterine and vaginal bleeding, unspecified: Secondary | ICD-10-CM

## 2020-04-22 DIAGNOSIS — Z1231 Encounter for screening mammogram for malignant neoplasm of breast: Secondary | ICD-10-CM | POA: Diagnosis not present

## 2020-05-20 ENCOUNTER — Ambulatory Visit (INDEPENDENT_AMBULATORY_CARE_PROVIDER_SITE_OTHER): Payer: 59 | Admitting: Obstetrics and Gynecology

## 2020-05-20 ENCOUNTER — Other Ambulatory Visit (HOSPITAL_COMMUNITY)
Admission: RE | Admit: 2020-05-20 | Discharge: 2020-05-20 | Disposition: A | Discharge status: Home / Self Care | Payer: 59 | Source: Ambulatory Visit | Attending: Obstetrics and Gynecology | Admitting: Obstetrics and Gynecology

## 2020-05-20 ENCOUNTER — Other Ambulatory Visit: Payer: Self-pay | Admitting: Obstetrics and Gynecology

## 2020-05-20 ENCOUNTER — Other Ambulatory Visit: Payer: Self-pay

## 2020-05-20 ENCOUNTER — Encounter: Payer: Self-pay | Admitting: Obstetrics and Gynecology

## 2020-05-20 VITALS — BP 127/80 | HR 76 | Resp 16 | Ht 65.0 in | Wt 217.0 lb

## 2020-05-20 DIAGNOSIS — Z9889 Other specified postprocedural states: Secondary | ICD-10-CM | POA: Diagnosis not present

## 2020-05-20 DIAGNOSIS — N939 Abnormal uterine and vaginal bleeding, unspecified: Secondary | ICD-10-CM

## 2020-05-20 DIAGNOSIS — Z124 Encounter for screening for malignant neoplasm of cervix: Secondary | ICD-10-CM | POA: Insufficient documentation

## 2020-05-20 NOTE — Addendum Note (Signed)
Addended by: Granville Lewis on: 05/20/2020 02:25 PM   Modules accepted: Orders

## 2020-05-20 NOTE — Progress Notes (Signed)
GYNECOLOGY OFFICE NOTE  History:  54 y.o. G3P2010 here today for bleeding s/p ablation. Had endometrial ablation ten years ago, no bleeding after until 5 years ago, had spotting and was put on OCPs which stopped it. Has not been on OCPs for several years and has not had bleeding until about a month ago, she started bleeding at work. Had one week of heavy, red bleeding. None since. No other complaints.  Past Medical History:  Diagnosis Date  . Essential hypertension 12/06/2015  . GERD (gastroesophageal reflux disease)   . Menorrhagia   . Morbid obesity ([place]) 12/22/2014  . Seasonal allergies     Past Surgical History:  Procedure Laterality Date  . ABLATION  2013  . CESAREAN SECTION    . gall stone  1995     Current Outpatient Medications:  .  ALPRAZolam (XANAX) 0.5 MG tablet, Take 1-2 tablets as needed for flying and travel., Disp: 20 tablet, Rfl: 0 .  hydrochlorothiazide (HYDRODIURIL) 25 MG tablet, Take one tablet daily., Disp: 90 tablet, Rfl: 3 .  [facility] ER 4 MG/5ML SUER, Take 5 mLs by mouth in the morning and at bedtime., Disp: , Rfl:  .  pantoprazole (PROTONIX) 40 MG tablet, Take 1 tablet (40 mg total) by mouth 2 (two) times daily., Disp: 180 tablet, Rfl: 3  The following portions of the patient's history were reviewed and updated as appropriate: allergies, current medications, past family history, past medical history, past social history, past surgical history and problem list.   Review of Systems:  Pertinent items noted in HPI and remainder of comprehensive ROS otherwise negative.   Objective:  Physical Exam BP 127/80   Pulse 76   Resp 16   Ht 5\' 5"  (1.651 m)   Wt 217 lb (98.4 kg)   BMI 36.11 kg/m  CONSTITUTIONAL: Well-developed, well-nourished female in no acute distress.  HENT:  Normocephalic, atraumatic. External right and left ear normal. Oropharynx is clear and moist EYES: Conjunctivae and EOM are normal. Pupils are equal, round, and reactive to light. No  scleral icterus.  NECK: Normal range of motion, supple, no masses SKIN: Skin is warm and dry. No rash noted. Not diaphoretic. No erythema. No pallor. NEUROLOGIC: Alert and oriented to person, place, and time. Normal reflexes, muscle tone coordination. No cranial nerve deficit noted. PSYCHIATRIC: Normal mood and affect. Normal behavior. Normal judgment and thought content. CARDIOVASCULAR: Normal heart rate noted RESPIRATORY: Effort normal, no problems with respiration noted ABDOMEN: Soft, no distention noted.   PELVIC: Normal appearing external genitalia; normal appearing vaginal mucosa and cervix.  No abnormal discharge noted.  Pap smear obtained.  EMB done, see note MUSCULOSKELETAL: Normal range of motion. No edema noted.  Exam done with chaperone present.  Labs and Imaging US Transvaginal Non-OB  Result Date: 04/22/2020 CLINICAL DATA:  Abnormal uterine bleeding for 1 month. Prior C-section and endometrial ablation 8 years ago. Patient is postmenopausal. Gravida 3 para 2. EXAM: TRANSABDOMINAL ULTRASOUND OF PELVIS TECHNIQUE: Transabdominal ultrasound examination of the pelvis was performed including evaluation of the uterus, ovaries, adnexal regions, and pelvic cul-de-sac. COMPARISON:  None. FINDINGS: Uterus Measurements: 7.9 x 3.3 x 4.7 centimeters = volume: 64 mL. Small posterior fibroid is 0.7 x 0.7 x 1.1 centimeters. Small cysts are identified within the central uterus at the junction of the cervix with the body of the uterus, measuring 0.6 x 0.6 x 0.6 centimeters and 0.9 x 0.4 centimeters. Endometrium Endometrium is not visualized following ablation. Right ovary Measurements: The ovary is not  visualized, either absent or obscured. Left ovary Measurements: The ovary is not visualized, either absent or obscured. Other findings:  No abnormal free fluid. IMPRESSION: 1. Cystic changes within the central uterus in the LOWER uterine body, possibly representing sequelae of prior endometrial ablation.  Similar findings could be identified in adenomatosis. 2. Small posterior fibroid is 1.1 centimeters. 3. Nonvisualized ovaries. Electronically Signed   By: Norva Pavlov M.D.   On: 04/22/2020 17:10   MM 3D SCREEN BREAST BILATERAL  Result Date: 04/23/2020 CLINICAL DATA:  Screening. EXAM: DIGITAL SCREENING BILATERAL MAMMOGRAM WITH TOMO AND CAD COMPARISON:  Previous exam(s). ACR Breast Density Category b: There are scattered areas of fibroglandular density. FINDINGS: There are no findings suspicious for malignancy. Images were processed with CAD. IMPRESSION: No mammographic evidence of malignancy. A result letter of this screening mammogram will be mailed directly to the patient. RECOMMENDATION: Screening mammogram in one year. (Code:SM-B-01Y) BI-RADS CATEGORY  1: Negative. Electronically Signed   By: Baird Lyons M.D.   On: 04/23/2020 11:57    Assessment & Plan:   1. Abnormal uterine bleeding (AUB) Likely proliferative, recommended EMB, patient agreeable Will base further management on results of EMB  2. S/P endometrial ablation  3. Cervical cancer screening Pap today   Routine preventative health maintenance measures emphasized. Please refer to After Visit Summary for other counseling recommendations.   No follow-ups on file.  Total face-to-face time with patient: 25 minutes. Over 50% of encounter was spent on counseling and coordination of care.  Baldemar Lenis, M.D. [facility]ending [facility] [employer] [facility])

## 2020-05-20 NOTE — Addendum Note (Signed)
Addended by: Granville Lewis on: 05/20/2020 02:22 PM   Modules accepted: Orders

## 2020-05-20 NOTE — Progress Notes (Signed)
ENDOMETRIAL BIOPSY      Joanne Winters is a 54 y.o. G3P2010 here for endometrial biopsy.  The indications for endometrial biopsy were reviewed.  Risks of the biopsy including cramping, bleeding, infection, uterine perforation, inadequate specimen and need for additional procedures were discussed. The patient states she understands and agrees to undergo procedure today. Consent was signed. Time out was performed.   Indications: post ablation bleeding Urine HCG: n/a  A bivalve speculum was placed into the vagina and the cervix was easily visualized and was prepped with Betadine x2. A single-toothed tenaculum was placed on the anterior lip of the cervix to stabilize it. The 3 mm pipelle was introduced into the endometrial cavity without difficulty to a depth of 6 cm, and a moderate amount of tissue was obtained and sent to pathology. This was repeated for a total of 3 passes. The instruments were removed from the patient's vagina. Minimal bleeding from the cervix at the tenaculum was noted.   The patient tolerated the procedure well. Routine post-procedure instructions were given to the patient.    Will base further management on results of biopsy.  Baldemar Lenis, M.D. [facility]ending [facility] [employer] [facility])

## 2020-05-24 LAB — CYTOLOGY - PAP
Comment: NEGATIVE
Diagnosis: NEGATIVE
High risk HPV: NEGATIVE

## 2020-05-29 ENCOUNTER — Other Ambulatory Visit: Payer: Self-pay | Admitting: Physician Assistant

## 2020-05-29 DIAGNOSIS — I10 Essential (primary) hypertension: Secondary | ICD-10-CM

## 2020-07-21 ENCOUNTER — Other Ambulatory Visit: Payer: Self-pay | Admitting: Physician Assistant

## 2020-07-21 DIAGNOSIS — I10 Essential (primary) hypertension: Secondary | ICD-10-CM

## 2020-08-06 ENCOUNTER — Other Ambulatory Visit: Payer: Self-pay | Admitting: Physician Assistant

## 2020-08-06 DIAGNOSIS — I10 Essential (primary) hypertension: Secondary | ICD-10-CM

## 2020-08-09 ENCOUNTER — Other Ambulatory Visit: Payer: Self-pay | Admitting: Physician Assistant

## 2020-08-10 ENCOUNTER — Encounter: Payer: Self-pay | Admitting: Physician Assistant

## 2020-08-10 MED ORDER — ALPRAZOLAM 0.5 MG PO TABS: ORAL_TABLET | ORAL | 0 refills | Status: DC

## 2020-08-10 NOTE — Telephone Encounter (Signed)
Last written 09/19/2019 #20 no refills Last appt 04/09/2020

## 2020-08-23 ENCOUNTER — Other Ambulatory Visit: Payer: Self-pay | Admitting: Physician Assistant

## 2020-08-23 DIAGNOSIS — I10 Essential (primary) hypertension: Secondary | ICD-10-CM

## 2021-02-12 ENCOUNTER — Encounter: Payer: Self-pay | Admitting: Physician Assistant

## 2021-04-26 ENCOUNTER — Other Ambulatory Visit: Payer: Self-pay | Admitting: Physician Assistant

## 2021-05-24 ENCOUNTER — Other Ambulatory Visit: Payer: Self-pay | Admitting: Medical-Surgical

## 2021-05-24 DIAGNOSIS — Z1231 Encounter for screening mammogram for malignant neoplasm of breast: Secondary | ICD-10-CM

## 2021-06-06 ENCOUNTER — Encounter: Payer: 59 | Admitting: Physician Assistant

## 2021-06-09 ENCOUNTER — Ambulatory Visit: Payer: 59

## 2021-06-17 ENCOUNTER — Ambulatory Visit: Payer: 59 | Admitting: Physician Assistant

## 2021-06-17 ENCOUNTER — Encounter: Payer: Self-pay | Admitting: Physician Assistant

## 2021-06-17 ENCOUNTER — Other Ambulatory Visit: Payer: Self-pay

## 2021-06-17 VITALS — BP 136/67 | HR 87 | Wt 213.0 lb

## 2021-06-17 DIAGNOSIS — R252 Cramp and spasm: Secondary | ICD-10-CM | POA: Diagnosis not present

## 2021-06-17 DIAGNOSIS — Z13220 Encounter for screening for lipoid disorders: Secondary | ICD-10-CM | POA: Diagnosis not present

## 2021-06-17 DIAGNOSIS — Z131 Encounter for screening for diabetes mellitus: Secondary | ICD-10-CM

## 2021-06-17 DIAGNOSIS — I10 Essential (primary) hypertension: Secondary | ICD-10-CM

## 2021-06-17 DIAGNOSIS — K2100 Gastro-esophageal reflux disease with esophagitis, without bleeding: Secondary | ICD-10-CM | POA: Diagnosis not present

## 2021-06-17 DIAGNOSIS — F40243 Fear of flying: Secondary | ICD-10-CM

## 2021-06-17 MED ORDER — HYDROCHLOROTHIAZIDE 25 MG PO TABS: ORAL_TABLET | ORAL | 3 refills | Status: DC

## 2021-06-17 MED ORDER — PANTOPRAZOLE SODIUM 40 MG PO TBEC: 40.0000 mg | DELAYED_RELEASE_TABLET | Freq: Two times a day (BID) | ORAL | 3 refills | Status: DC

## 2021-06-17 MED ORDER — ALPRAZOLAM 0.5 MG PO TABS: ORAL_TABLET | ORAL | 0 refills | Status: DC

## 2021-06-17 NOTE — Progress Notes (Signed)
Subjective:    Patient ID: Joanne Winters, female    DOB: 24-Aug-1966, 55 y.o.   MRN: [id number]  HPI Patient is a 55 year old female with hypertension, GERD who presents to the clinic for complete physical but since she is having a problem we will reschedule.  She is having some leg cramping for the last week or so.  Mostly in the evenings and in the middle of the night. It is bilateral legs.  She denies any swelling or tenderness.  She denies any increase in pain or cramping with walking. Aleve does not help. She is working 12 hour shifts and not drinking as much fluids.   HTN- on HCTZ without problems. Not checking BP at home. No CP, palpitations, or SOB.   GERD- refilled Prilosec. No problems or concerns.    .. 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   Multiple thyroid nodules 02/28/2017   Left knee pain 07/11/2017   Plantar fasciitis, left 09/11/2017   Hoarseness or changing voice 09/11/2017   Cough 02/13/2018   DDD (degenerative disc disease), lumbar 05/27/2018   Baker's cyst of knee, left 06/05/2018   DVT (deep venous thrombosis) (HCC) 08/16/2018   Fall 08/25/2018   Acute pain of right shoulder 08/25/2018   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   Chronic pain of left knee 08/30/2018   Closed nondisplaced fracture of base of neck of left femur (HCC) 08/30/2018   Arthritis of right  acromioclavicular joint 09/16/2018   Vaginal discharge 10/21/2018   Burning with urination 10/21/2018   Tendinopathy of right rotator cuff 10/22/2018   Family history of colon cancer 05/07/2019   Menorrhagia 06/30/2019   Chest discomfort 06/30/2019   Leg cramps 06/21/2021   Gastroesophageal reflux disease with esophagitis 06/21/2021   Resolved Ambulatory Problems    Diagnosis Date Noted   Lateral epicondylitis of right elbow 12/22/2014   Morbid obesity (HCC) 12/22/2014   Abnormal weight gain 03/01/2015   Right calf pain 04/26/2015   Elevated blood pressure 04/26/2015   Pain in lower limb 05/26/2015   Heel spur, right 09/11/2017   Throat tightness 09/11/2017   Spasm of muscle, back 02/28/2018   Hamstring strain, left, subsequent encounter 05/24/2018   No Additional Past Medical History    Review of Systems See HPI.     Objective:   Physical Exam Vitals reviewed.  Constitutional:      Appearance: Normal appearance. She is obese.  HENT:     Head: Normocephalic.  Cardiovascular:     Rate and Rhythm: Normal rate and regular rhythm.     Pulses: Normal pulses.     Heart sounds: Normal heart sounds.  Pulmonary:     Effort: Pulmonary effort is normal.     Breath sounds: Normal breath sounds.  Musculoskeletal:     Right lower leg: No edema.     Left lower leg: No edema.     Comments: No swelling, redness,  warmth or tenderness.   Lymphadenopathy:     Cervical: No cervical adenopathy.  Neurological:     General: No focal deficit present.     Mental Status: She is alert and oriented to person, place, and time.  Psychiatric:        Mood and Affect: Mood normal.      .. Depression screen [facility] 2/9 06/21/2021 05/24/2018 02/27/2017  Decreased Interest 0 0 0  Down, Depressed, Hopeless 0 2 0  PHQ - 2 Score 0 2 0  Altered sleeping - 3 -  Tired, decreased energy - 3 -  Change in appetite - 0 -  Feeling bad or failure about yourself  - 1 -  Trouble concentrating - 0 -  Moving  slowly or fidgety/restless - 0 -  PHQ-9 Score - 9 -   .[name] Kitchen GAD 7 : Generalized Anxiety Score 06/21/2021 05/24/2018  Nervous, Anxious, on Edge 1 1  Control/stop worrying 1 2  Worry too much - different things 1 2  Trouble relaxing 1 3  Restless 0 2  Easily annoyed or irritable 1 1  Afraid - awful might happen 0 0  Total GAD 7 Score 5 11  Anxiety Difficulty Somewhat difficult -        Assessment & Plan:  [name] KitchenMarland KitchenPearlie was seen today for annual exam.  Diagnoses and all orders for this visit:  Leg cramps -     Magnesium -     COMPLETE METABOLIC PANEL WITH GFR -     CBC w/Diff/Platelet -     TSH -     Fe+TIBC+Fer  Essential hypertension -     hydrochlorothiazide (HYDRODIURIL) 25 MG tablet; TAKE 1 TABLET BY MOUTH  DAILY.  Gastroesophageal reflux disease with esophagitis -     pantoprazole (PROTONIX) 40 MG tablet; Take 1 tablet (40 mg total) by mouth 2 (two) times daily.  Screening for lipid disorders -     Lipid Panel w/reflex Direct LDL  Screening for diabetes mellitus -     COMPLETE METABOLIC PANEL WITH GFR  Fear of flying -     ALPRAZolam (XANAX) 0.5 MG tablet; Take 1-2 tablets as needed for flying and travel.  Discussed leg cramps. Will check potassium and magnesium. No new medications. Likely due to overuse and under hydration. Discussed gentle massage, stretches. Does not sound like claudication. Ok to use OTC alka seltzer tablets or start magnesium 250mg  daily.   PHQ/GAD numbers stable.  Refilled xanax for as needed usage. Mostly for fear of flying.   Pt will reschedule her CPE. Labs ordered today.

## 2021-06-17 NOTE — Patient Instructions (Addendum)
Leg Cramps Leg cramps occur when one or more muscles tighten and a person has no control over it (involuntary muscle contraction). Muscle cramps are most common in the calf muscles of the leg. They can occur during exercise or at rest. Leg cramps are painful, and they may last for a few seconds to a few minutes. Cramps may return several times before they finallystop. Usually, leg cramps are not caused by a serious medical problem. In many cases, the cause is not known. Some common causes include: Excessive physical effort (overexertion), such as during intense exercise. Doing the same motion over and over. Staying in a certain position for a long period of time. Improper preparation, form, or technique while doing a sport or an activity. Dehydration. Injury. Side effects of certain medicines. Abnormally low levels of minerals in your blood (electrolytes), especially potassium and calcium. This could result from: Pregnancy. Taking diuretic medicines. Follow these instructions at home: Eating and drinking Drink enough fluid to keep your urine pale yellow. Staying hydrated may help prevent cramps. Eat a healthy diet that includes plenty of nutrients to help your muscles function. A healthy diet includes fruits and vegetables, lean protein, whole grains, and low-fat or nonfat dairy products. Managing pain, stiffness, and swelling     Try massaging, stretching, and relaxing the affected muscle. Do this for several minutes at a time. If directed, put ice on areas that are sore or painful after a cramp. To do this: Put ice in a plastic bag. Place a towel between your skin and the bag. Leave the ice on for 20 minutes, 2-3 times a day. Remove the ice if your skin turns bright red. This is very important. If you cannot feel pain, heat, or cold, you have a greater risk of damage to the area. If directed, apply heat to muscles that are tense or tight. Do this before you exercise, or as often as told  by your health care provider. Use the heat source that your health care provider recommends, such as a moist heat pack or a heating pad. To do this: Place a towel between your skin and the heat source. Leave the heat on for 20-30 minutes. Remove the heat if your skin turns bright red. This is especially important if you are unable to feel pain, heat, or cold. You may have a greater risk of getting burned. Try taking hot showers or baths to help relax tight muscles. General instructions If you are having frequent leg cramps, avoid intense exercise for several days. Take over-the-counter and prescription medicines only as told by your health care provider. Keep all follow-up visits. This is important. Contact a health care provider if: Your leg cramps get more severe or more frequent, or they do not improve over time. Your foot becomes cold, numb, or blue. Summary Muscle cramps can develop in any muscle, but the most common place is in the calf muscles of the leg. Leg cramps are painful, and they may last for a few seconds to a few minutes. Usually, leg cramps are not caused by a serious medical problem. Often, the cause is not known. Stay hydrated, and take over-the-counter and prescription medicines only as told by your health care provider. This information is not intended to replace advice given to you by your health care provider. Make sure you discuss any questions you have with your healthcare provider. Document Revised: 04/21/2020 Document Reviewed: 04/21/2020 Elsevier Patient Education  2022 Elsevier Inc.  

## 2021-06-18 LAB — CBC WITH DIFFERENTIAL/PLATELET
Absolute Monocytes: 640 cells/uL (ref 200–950)
Basophils Absolute: 16 cells/uL (ref 0–200)
Basophils Relative: 0.2 %
Eosinophils Absolute: 123 cells/uL (ref 15–500)
Eosinophils Relative: 1.5 %
HCT: 39.9 % (ref 35.0–45.0)
Hemoglobin: 12.9 g/dL (ref 11.7–15.5)
Lymphs Abs: 2165 cells/uL (ref 850–3900)
MCH: 25.6 pg — ABNORMAL LOW (ref 27.0–33.0)
MCHC: 32.3 g/dL (ref 32.0–36.0)
MCV: 79.2 fL — ABNORMAL LOW (ref 80.0–100.0)
MPV: 10.6 fL (ref 7.5–12.5)
Monocytes Relative: 7.8 %
Neutro Abs: 5256 cells/uL (ref 1500–7800)
Neutrophils Relative %: 64.1 %
Platelets: 249 10*3/uL (ref 140–400)
RBC: 5.04 10*6/uL (ref 3.80–5.10)
RDW: 14 % (ref 11.0–15.0)
Total Lymphocyte: 26.4 %
WBC: 8.2 10*3/uL (ref 3.8–10.8)

## 2021-06-18 LAB — COMPLETE METABOLIC PANEL WITH GFR
AG Ratio: 1.6 (calc) (ref 1.0–2.5)
ALT: 11 U/L (ref 6–29)
AST: 13 U/L (ref 10–35)
Albumin: 4.5 g/dL (ref 3.6–5.1)
Alkaline phosphatase (APISO): 64 U/L (ref 37–153)
BUN: 8 mg/dL (ref 7–25)
CO2: 27 mmol/L (ref 20–32)
Calcium: 9.3 mg/dL (ref 8.6–10.4)
Chloride: 105 mmol/L (ref 98–110)
Creat: 0.66 mg/dL (ref 0.50–1.05)
GFR, Est African American: 115 mL/min/{1.73_m2} (ref >=60)
GFR, Est Non African American: 99 mL/min/{1.73_m2} (ref >=60)
Globulin: 2.8 g/dL (calc) (ref 1.9–3.7)
Glucose, Bld: 90 mg/dL (ref 65–99)
Potassium: 3.8 mmol/L (ref 3.5–5.3)
Sodium: 140 mmol/L (ref 135–146)
Total Bilirubin: 0.4 mg/dL (ref 0.2–1.2)
Total Protein: 7.3 g/dL (ref 6.1–8.1)

## 2021-06-18 LAB — LIPID PANEL W/REFLEX DIRECT LDL
Cholesterol: 189 mg/dL (ref <200)
HDL: 58 mg/dL (ref >=50)
LDL Cholesterol (Calc): 116 mg/dL (calc) — ABNORMAL HIGH
Non-HDL Cholesterol (Calc): 131 mg/dL (calc) — ABNORMAL HIGH (ref <130)
Total CHOL/HDL Ratio: 3.3 (calc) (ref <5.0)
Triglycerides: 61 mg/dL (ref <150)

## 2021-06-18 LAB — IRON,TIBC AND FERRITIN PANEL
%SAT: 20 % (calc) (ref 16–45)
Ferritin: 71 ng/mL (ref 16–232)
Iron: 69 ug/dL (ref 45–160)
TIBC: 339 mcg/dL (calc) (ref 250–450)

## 2021-06-18 LAB — TSH: TSH: 1.39 mIU/L

## 2021-06-18 LAB — MAGNESIUM: Magnesium: 2.1 mg/dL (ref 1.5–2.5)

## 2021-06-21 ENCOUNTER — Encounter: Payer: Self-pay | Admitting: Physician Assistant

## 2021-06-21 DIAGNOSIS — R252 Cramp and spasm: Secondary | ICD-10-CM | POA: Insufficient documentation

## 2021-06-21 DIAGNOSIS — K2100 Gastro-esophageal reflux disease with esophagitis, without bleeding: Secondary | ICD-10-CM | POA: Insufficient documentation

## 2021-06-21 NOTE — Progress Notes (Signed)
[name],   Magnesium looks good.  Kidney, liver, glucose look great.  Thyroid normal.  Iron stores and hemoglobin are good.  Serum iron a little low. Start a iron supplement in the morning.  HDL, good cholesterol, looks good.  LDL, bad cholesterol, stable and looks pretty good.

## 2021-06-28 ENCOUNTER — Encounter: Payer: Self-pay | Admitting: Physician Assistant

## 2021-07-01 ENCOUNTER — Ambulatory Visit (INDEPENDENT_AMBULATORY_CARE_PROVIDER_SITE_OTHER): Payer: 59 | Admitting: Physician Assistant

## 2021-07-01 VITALS — BP 130/63 | HR 76 | Ht 65.0 in | Wt 215.0 lb

## 2021-07-01 DIAGNOSIS — R252 Cramp and spasm: Secondary | ICD-10-CM

## 2021-07-01 DIAGNOSIS — Z87892 Personal history of anaphylaxis: Secondary | ICD-10-CM

## 2021-07-01 DIAGNOSIS — T7840XA Allergy, unspecified, initial encounter: Secondary | ICD-10-CM

## 2021-07-01 DIAGNOSIS — Z0000 Encounter for general adult medical examination without abnormal findings: Secondary | ICD-10-CM

## 2021-07-01 DIAGNOSIS — Z23 Encounter for immunization: Secondary | ICD-10-CM

## 2021-07-01 MED ORDER — EPINEPHRINE 0.3 MG/0.3ML IJ SOAJ: 0.3000 mg | INTRAMUSCULAR | 1 refills | Status: DC | PRN

## 2021-07-01 NOTE — Progress Notes (Signed)
Subjective:     Joanne Winters is a 55 y.o. female and is here for a comprehensive physical exam. The patient reports problems - she received a tdap before I came into room and when I walked in she had a scratchy throat, sweaty and feeling like throat is getting tight .  Pt was having some bilateral leg cramps worse at work. Magnesium normal. No swelling or tenderness to palpation. Not better since last visit.    Social History   Socioeconomic History   Marital status: Media planner    Spouse name: Not on file   Number of children: Not on file   Years of education: Not on file   Highest education level: Not on file  Occupational History   Not on file  Tobacco Use   Smoking status: Never   Smokeless tobacco: Never  Vaping Use   Vaping Use: Never used  Substance and Sexual Activity   Alcohol use: Yes    Alcohol/week: 0.0 standard drinks    Comment: occ   Drug use: No   Sexual activity: Yes    Birth control/protection: None  Other Topics Concern   Not on file  Social History Narrative   Not on file   Social Determinants of Health   Financial Resource Strain: Not on file  Food Insecurity: Not on file  Transportation Needs: Not on file  Physical Activity: Not on file  Stress: Not on file  Social Connections: Not on file  Intimate Partner Violence: Not on file   Health Maintenance  Topic Date Due   MAMMOGRAM  04/22/2021   COVID-19 Vaccine (3 - Moderna risk series) 07/17/2021 (Originally 09/01/2020)   Zoster Vaccines- Shingrix (1 of 2) 10/01/2021 (Originally 08/20/1984)   HIV Screening  07/01/2022 (Originally 08/20/1980)   INFLUENZA VACCINE  07/18/2021   COLONOSCOPY (Pts 45-39yrs Insurance coverage will need to be confirmed)  05/01/2024   PAP SMEAR-Modifier  05/20/2025   TETANUS/TDAP  07/02/2031   Hepatitis C Screening  Completed   HPV VACCINES  Aged Out   Pneumococcal Vaccine 47-75 Years old  Discontinued    The following portions of the patient's history were reviewed  and updated as appropriate: allergies, current medications, past family history, past medical history, past social history, past surgical history, and problem list.  Review of Systems Pertinent items noted in HPI and remainder of comprehensive ROS otherwise negative.   Objective:    BP 130/63   Pulse 76   Ht 5\' 5"  (1.651 m)   Wt 215 lb (97.5 kg)   SpO2 99%   BMI 35.78 kg/m  General appearance: alert, cooperative, and appears stated age Head: Normocephalic, without obvious abnormality, atraumatic Eyes: conjunctivae/corneas clear. PERRL, EOM's intact. Fundi benign. Ears: normal TM's and external ear canals both ears Nose: Nares normal. Septum midline. Mucosa normal. No drainage or sinus tenderness. Throat: lips, mucosa, and tongue normal; teeth and gums normal Neck: no adenopathy, no carotid bruit, no JVD, supple, symmetrical, trachea midline, and thyroid not enlarged, symmetric, no tenderness/mass/nodules Back: symmetric, no curvature. ROM normal. No CVA tenderness. Lungs: clear to auscultation bilaterally Heart: regular rate and rhythm, S1, S2 normal, no murmur, click, rub or gallop Abdomen: soft, non-tender; bowel sounds normal; no masses,  no organomegaly Extremities: extremities normal, atraumatic, no cyanosis or edema no calf swelling, redness, or tenderness.  Pulses: 2+ and symmetric Skin: Skin color, texture, turgor normal. No rashes or lesions Lymph nodes: Cervical, supraclavicular, and axillary nodes normal. Neurologic: Alert and oriented X 3, normal  strength and tone. Normal symmetric reflexes. Normal coordination and gait   .[name] Kitchen Depression screen [facility] 2/9 07/01/2021 06/21/2021 05/24/2018 02/27/2017  Decreased Interest 0 0 0 0  Down, Depressed, Hopeless 0 0 2 0  PHQ - 2 Score 0 0 2 0  Altered sleeping - - 3 -  Tired, decreased energy - - 3 -  Change in appetite - - 0 -  Feeling bad or failure about yourself  - - 1 -  Trouble concentrating - - 0 -  Moving slowly or  fidgety/restless - - 0 -  PHQ-9 Score - - 9 -    Assessment:    Healthy female exam.     Plan:    [name] KitchenMarland KitchenPearlie was seen today for annual exam.  Diagnoses and all orders for this visit:  Routine physical examination  Need for Tdap vaccination -     Tdap vaccine greater than or equal to 7yo IM  Allergic reaction, initial encounter -     EPINEPHrine 0.3 mg/0.3 mL IJ SOAJ injection; Inject 0.3 mg into the muscle as needed for anaphylaxis.  History of anaphylaxis -     EPINEPHrine 0.3 mg/0.3 mL IJ SOAJ injection; Inject 0.3 mg into the muscle as needed for anaphylaxis.  Appeared to have allergic reaction to Tdap. 50mg  of oral benadryl given in office and patient monitored for 30 minutes. Epi pen on hand but did not have to use.  Tdap added to allergy list.  Epi pen rx today to use due to allergies.  Continue follow up with allergy.   .. Discussed 150 minutes of exercise a week.  Encouraged vitamin D 1000 units and Calcium 1300mg  or 4 servings of dairy a day.  PHQ to goal. No concerns.  Fasting labs discussed in office today.  No major concerns.  Added iron supplement.  Mammogram scheduled for 8/4.  Colonoscopy UTD.  Declined shingrix. Moderna x2. Needs booster.  Tdap now up to date.   Bilateral leg cramps not improved with magnesium.  Will get ABI. Wear good supportive shoes.  Could be coming from low back pain.  If ABI normal consider sports medicine for low back work up.    See After Visit Summary for Counseling Recommendations

## 2021-07-01 NOTE — Patient Instructions (Signed)
Health Maintenance, Female Adopting a healthy lifestyle and getting preventive care are important in promoting health and wellness. Ask your health care provider about: The right schedule for you to have regular tests and exams. Things you can do on your own to prevent diseases and keep yourself healthy. What should I know about diet, weight, and exercise? Eat a healthy diet  Eat a diet that includes plenty of vegetables, fruits, low-fat dairy products, and lean protein. Do not eat a lot of foods that are high in solid fats, added sugars, or sodium.  Maintain a healthy weight Body mass index (BMI) is used to identify weight problems. It estimates body fat based on height and weight. Your health care provider can help determineyour BMI and help you achieve or maintain a healthy weight. Get regular exercise Get regular exercise. This is one of the most important things you can do for your health. Most adults should: Exercise for at least 150 minutes each week. The exercise should increase your heart rate and make you sweat (moderate-intensity exercise). Do strengthening exercises at least twice a week. This is in addition to the moderate-intensity exercise. Spend less time sitting. Even light physical activity can be beneficial. Watch cholesterol and blood lipids Have your blood tested for lipids and cholesterol at 56 years of age, then havethis test every 5 years. Have your cholesterol levels checked more often if: Your lipid or cholesterol levels are high. You are older than 56 years of age. You are at high risk for heart disease. What should I know about cancer screening? Depending on your health history and family history, you may need to have cancer screening at various ages. This may include screening for: Breast cancer. Cervical cancer. Colorectal cancer. Skin cancer. Lung cancer. What should I know about heart disease, diabetes, and high blood pressure? Blood pressure and heart  disease High blood pressure causes heart disease and increases the risk of stroke. This is more likely to develop in people who have high blood pressure readings, are of African descent, or are overweight. Have your blood pressure checked: Every 3-5 years if you are 18-39 years of age. Every year if you are 40 years old or older. Diabetes Have regular diabetes screenings. This checks your fasting blood sugar level. Have the screening done: Once every three years after age 40 if you are at a normal weight and have a low risk for diabetes. More often and at a younger age if you are overweight or have a high risk for diabetes. What should I know about preventing infection? Hepatitis B If you have a higher risk for hepatitis B, you should be screened for this virus. Talk with your health care provider to find out if you are at risk forhepatitis B infection. Hepatitis C Testing is recommended for: Everyone born from 1945 through 1965. Anyone with known risk factors for hepatitis C. Sexually transmitted infections (STIs) Get screened for STIs, including gonorrhea and chlamydia, if: You are sexually active and are younger than 56 years of age. You are older than 56 years of age and your health care provider tells you that you are at risk for this type of infection. Your sexual activity has changed since you were last screened, and you are at increased risk for chlamydia or gonorrhea. Ask your health care provider if you are at risk. Ask your health care provider about whether you are at high risk for HIV. Your health care provider may recommend a prescription medicine to help   prevent HIV infection. If you choose to take medicine to prevent HIV, you should first get tested for HIV. You should then be tested every 3 months for as long as you are taking the medicine. Pregnancy If you are about to stop having your period (premenopausal) and you may become pregnant, seek counseling before you get  pregnant. Take 400 to 800 micrograms (mcg) of folic acid every day if you become pregnant. Ask for birth control (contraception) if you want to prevent pregnancy. Osteoporosis and menopause Osteoporosis is a disease in which the bones lose minerals and strength with aging. This can result in bone fractures. If you are 65 years old or older, or if you are at risk for osteoporosis and fractures, ask your health care provider if you should: Be screened for bone loss. Take a calcium or vitamin D supplement to lower your risk of fractures. Be given hormone replacement therapy (HRT) to treat symptoms of menopause. Follow these instructions at home: Lifestyle Do not use any products that contain nicotine or tobacco, such as cigarettes, e-cigarettes, and chewing tobacco. If you need help quitting, ask your health care provider. Do not use street drugs. Do not share needles. Ask your health care provider for help if you need support or information about quitting drugs. Alcohol use Do not drink alcohol if: Your health care provider tells you not to drink. You are pregnant, may be pregnant, or are planning to become pregnant. If you drink alcohol: Limit how much you use to 0-1 drink a day. Limit intake if you are breastfeeding. Be aware of how much alcohol is in your drink. In the [place] one drink equals one 12 oz bottle of beer (355 mL), one 5 oz glass of wine (148 mL), or one 1 oz glass of hard liquor (44 mL). General instructions Schedule regular health, dental, and eye exams. Stay current with your vaccines. Tell your health care provider if: You often feel depressed. You have ever been abused or do not feel safe at home. Summary Adopting a healthy lifestyle and getting preventive care are important in promoting health and wellness. Follow your health care provider's instructions about healthy diet, exercising, and getting tested or screened for diseases. Follow your health care provider's  instructions on monitoring your cholesterol and blood pressure. This information is not intended to replace advice given to you by your health care provider. Make sure you discuss any questions you have with your healthcare provider. Document Revised: 11/27/2018 Document Reviewed: 11/27/2018 Elsevier Patient Education  2022 Elsevier Inc.  

## 2021-07-01 NOTE — Telephone Encounter (Signed)
Pt seen in clinic today.  

## 2021-07-11 ENCOUNTER — Encounter: Payer: Self-pay | Admitting: Physician Assistant

## 2021-07-21 ENCOUNTER — Ambulatory Visit (INDEPENDENT_AMBULATORY_CARE_PROVIDER_SITE_OTHER): Payer: 59

## 2021-07-21 ENCOUNTER — Other Ambulatory Visit: Payer: Self-pay

## 2021-07-21 DIAGNOSIS — Z1231 Encounter for screening mammogram for malignant neoplasm of breast: Secondary | ICD-10-CM

## 2021-07-25 ENCOUNTER — Ambulatory Visit (HOSPITAL_COMMUNITY): Payer: 59

## 2021-07-28 ENCOUNTER — Ambulatory Visit (HOSPITAL_COMMUNITY): Admission: RE | Admit: 2021-07-28 | Payer: 59 | Source: Ambulatory Visit

## 2021-08-05 ENCOUNTER — Ambulatory Visit (HOSPITAL_COMMUNITY)
Admission: RE | Admit: 2021-08-05 | Discharge: 2021-08-05 | Disposition: A | Discharge status: Home / Self Care | Payer: 59 | Source: Ambulatory Visit | Attending: Physician Assistant | Admitting: Physician Assistant

## 2021-08-05 ENCOUNTER — Other Ambulatory Visit: Payer: Self-pay

## 2021-08-05 DIAGNOSIS — R252 Cramp and spasm: Secondary | ICD-10-CM | POA: Insufficient documentation

## 2021-08-05 NOTE — Progress Notes (Signed)
Normal ABI. No signs of any arterial disease.

## 2021-08-05 NOTE — Progress Notes (Signed)
ABI's have been completed. Preliminary results can be found in CV Proc through chart review.   08/05/21 9:11 AM Olen Cordial RVT

## 2021-12-09 ENCOUNTER — Ambulatory Visit (INDEPENDENT_AMBULATORY_CARE_PROVIDER_SITE_OTHER): Payer: 59

## 2021-12-09 ENCOUNTER — Ambulatory Visit: Payer: 59 | Admitting: Sports Medicine

## 2021-12-09 ENCOUNTER — Other Ambulatory Visit: Payer: Self-pay

## 2021-12-09 DIAGNOSIS — Z09 Encounter for follow-up examination after completed treatment for conditions other than malignant neoplasm: Secondary | ICD-10-CM

## 2021-12-09 DIAGNOSIS — M1712 Unilateral primary osteoarthritis, left knee: Secondary | ICD-10-CM

## 2021-12-09 DIAGNOSIS — M7122 Synovial cyst of popliteal space [Baker], left knee: Secondary | ICD-10-CM

## 2021-12-09 NOTE — Assessment & Plan Note (Signed)
This is a pleasant 55 year old female, multiple derangements in her knee noted on an MRI several years ago. Known osteoarthritis, more recently increasing swelling, pain in the posterior aspect of her She has an effusion, tenderness at the posterior/medial joint line, negative Homans' sign and no calf swelling. Aspiration and injection, x-rays, formal physical therapy, return to see me in a month.

## 2021-12-09 NOTE — Progress Notes (Signed)
° ° °  Procedures performed today:    Procedure: Real-time Ultrasound Guided aspiration/injection of left knee Device: Samsung HS60  Verbal informed consent obtained.  Time-out conducted.  Noted no overlying erythema, induration, or other signs of local infection.  Skin prepped in a sterile fashion.  Local anesthesia: Topical Ethyl chloride.  With sterile technique and under real time ultrasound guidance: Noted effusion, aspirated 30 mils of clear, straw-colored fluid, syringe switched and 1 cc Kenalog 40, 2 cc lidocaine, 2 cc bupivacaine injected easily Completed without difficulty  Advised to call if fevers/chills, erythema, induration, drainage, or persistent bleeding.  Images permanently stored and available for review in PACS.  Impression: Technically successful ultrasound guided aspiration/injection.  Independent interpretation of notes and tests performed by another provider:   None.  Brief History, Exam, Impression, and Recommendations:    Primary osteoarthritis of left knee This is a pleasant 55 year old female, multiple derangements in her knee noted on an MRI several years ago. Known osteoarthritis, more recently increasing swelling, pain in the posterior aspect of her She has an effusion, tenderness at the posterior/medial joint line, negative Homans' sign and no calf swelling. Aspiration and injection, x-rays, formal physical therapy, return to see me in a month.    ___________________________________________ Ihor Austin. Benjamin Stain, M.D., ABFM., CAQSM. Primary Care and Sports Medicine [facility]  Adjunct Instructor of Family Medicine  [employer]

## 2021-12-23 ENCOUNTER — Ambulatory Visit: Payer: 59 | Admitting: Rehabilitative and Restorative Service Providers"

## 2021-12-29 ENCOUNTER — Ambulatory Visit: Payer: 59 | Attending: Sports Medicine | Admitting: Physical Therapy

## 2021-12-29 ENCOUNTER — Other Ambulatory Visit: Payer: Self-pay

## 2021-12-29 DIAGNOSIS — M25562 Pain in left knee: Secondary | ICD-10-CM | POA: Diagnosis not present

## 2021-12-29 DIAGNOSIS — M7122 Synovial cyst of popliteal space [Baker], left knee: Secondary | ICD-10-CM | POA: Diagnosis present

## 2021-12-29 DIAGNOSIS — M1712 Unilateral primary osteoarthritis, left knee: Secondary | ICD-10-CM | POA: Insufficient documentation

## 2021-12-29 DIAGNOSIS — G8929 Other chronic pain: Secondary | ICD-10-CM | POA: Diagnosis not present

## 2021-12-29 DIAGNOSIS — R2689 Other abnormalities of gait and mobility: Secondary | ICD-10-CM | POA: Diagnosis not present

## 2021-12-29 DIAGNOSIS — M6281 Muscle weakness (generalized): Secondary | ICD-10-CM

## 2021-12-29 DIAGNOSIS — R262 Difficulty in walking, not elsewhere classified: Secondary | ICD-10-CM | POA: Diagnosis not present

## 2021-12-29 DIAGNOSIS — M25662 Stiffness of left knee, not elsewhere classified: Secondary | ICD-10-CM

## 2021-12-29 NOTE — Therapy (Signed)
[facility] [place] [address] [place], [place], [address] Phone: [phone]   Fax:  [phone]  Physical Therapy Evaluation  Patient Details  Name: Joanne Winters MRN: [id number] Date of Birth: Apr 11, 1966 Referring Provider (PT): Monica Becton, MD   Encounter Date: 12/29/2021   PT End of Session - 12/29/21 1621     Visit Number 1    Number of Visits 12    Date for PT Re-Evaluation 02/09/22    Authorization Type UHC    PT Start Time 1530    PT Stop Time 1615    PT Time Calculation (min) 45 min    Activity Tolerance Patient tolerated treatment well    Behavior During Therapy [facility] for tasks assessed/performed             Past Medical History:  Diagnosis Date   Essential hypertension 12/06/2015   GERD (gastroesophageal reflux disease)    Menorrhagia    Morbid obesity (HCC) 12/22/2014   Seasonal allergies     Past Surgical History:  Procedure Laterality Date   ABLATION  2013   CESAREAN SECTION     gall stone  1995    There were no vitals filed for this visit.    Subjective Assessment - 12/29/21 1536     Subjective Pt reports mensical repair 3 years ago. She does report L knee OA chronically. Gets most irritated walking or performing stairs. Difficulty sleeping at times because of pain. Knee pain exacerbated ~1 month ago (noted a fall at work on her left knee 4 months ago). Pt notesshe will sometimes drag her leg because of her pain. Pt had fluid in her knee that Dr. Karie Schwalbe aspirated. Pt states she's been doing some exercises at home. Pt has a physical job    Pertinent History Knee OA, prior Baker's cyst    Limitations Walking;Standing;Lifting;House hold activities    How long can you sit comfortably? >4 hours    How long can you stand comfortably? All day if she's not doing anything strenous    How long can you walk comfortably? ~20 min she can feel tightness at work; at home not as much    Patient Stated Goals  Improve mobility with less pain    Currently in Pain? Yes    Pain Score 0-No pain   At worst 8/10   Pain Location Knee    Pain Orientation Left    Pain Descriptors / Indicators Tightness;Sharp;Shooting    Pain Type Chronic pain    Pain Radiating Towards Along lateral knee/calf    Pain Onset More than a month ago    Pain Frequency Constant    Aggravating Factors  Stairs, walking    Pain Relieving Factors Ice/heat,    Effect of Pain on Daily Activities Work, walking                [facility] PT Assessment - 12/29/21 0001       Assessment   Medical Diagnosis M17.12 (ICD-10-CM) - Primary osteoarthritis of left knee    Referring Provider (PT) Monica Becton, MD    Onset Date/Surgical Date --   Ongoing, but exacerbation ~1 month ago   Hand Dominance Right    Prior Therapy Shoulder      Precautions   Precautions None      Restrictions   Weight Bearing Restrictions No      Balance Screen   Has the patient fallen in the past 6 months Yes  How many times? 2   At work   Has the patient had a decrease in activity level because of a fear of falling?  No    Is the patient reluctant to leave their home because of a fear of falling?  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)    Leisure [place] (has not been able to because of R shoulder)      Observation/Other Assessments   Focus on Therapeutic Outcomes (FOTO)  51 (Risk adjusted 49); predicted 70 at visit 14      Functional Tests   Functional tests Sit to Stand      Sit to Stand   Comments Decreased weight shift left; decreased knee flexion left      ROM / Strength   AROM  / PROM / Strength AROM;Strength      AROM   AROM Assessment Site Knee    Right/Left Knee Right;Left    Right Knee Extension 0    Right Knee Flexion 120    Left Knee Extension -6    Left Knee Flexion 100      Strength   Strength Assessment Site Hip;Knee    Right/Left Hip Right;Left    Right Hip Flexion 4+/5    Right Hip Extension 4+/5    Right Hip ABduction 5/5    Left Hip Flexion 3+/5    Left Hip Extension 3+/5    Left Hip ABduction 3/5    Left Hip ADduction 3/5    Right/Left Knee Right;Left    Right Knee Flexion 5/5    Right Knee Extension 5/5    Left Knee Flexion 5/5    Left Knee Extension 4-/5   difficulty performing 10 SLRs     Palpation   Patella mobility WFL    Palpation comment TTP: L hamstring, ITB, gastroc/soleus, ant tib, extensor hallucis longus      Ambulation/Gait   Ambulation Distance (Feet) --   Around gym   Assistive device None    Gait Pattern Step-through pattern;Decreased weight shift to left;Decreased stance time - left;Antalgic    Ambulation Surface Level;Indoor                        Objective measurements completed on examination: See above findings.       [facility] Adult PT Treatment/Exercise - 12/29/21 0001       Exercises   Exercises Knee/Hip      Knee/Hip Exercises: Stretches   ITB Stretch 20 seconds    Gastroc Stretch 30 seconds    Soleus Stretch 30 seconds      Knee/Hip Exercises: Supine   Heel Slides Strengthening;Left;10 reps    Heel Slides Limitations with strap    Straight Leg Raises 10 reps                     PT Education - 12/29/21 1623     Education Details Exam findings, POC, HEP    Person(s) Educated Patient    Methods Explanation;Demonstration;Tactile cues;Verbal cues;Handout  Comprehension Verbalized understanding;Returned demonstration;Verbal cues required;Tactile cues required                 PT Long Term Goals - 12/29/21 1629       PT LONG TERM GOAL #1   Title Pt will be  independent with HEP    Time 6    Period Weeks    Status New    Target Date 02/09/22      PT LONG TERM GOAL #2   Title Pt will report decrease in max pain to </=4/10 during all activity    Baseline 8/10 at worst    Time 6    Period Weeks    Status New    Target Date 02/09/22      PT LONG TERM GOAL #3   Title Pt will be able to walk at least 30 minutes at work with 50% less pain    Time 6    Period Weeks    Status New    Target Date 02/09/22      PT LONG TERM GOAL #4   Title Pt will be able to squat at least 45 lbs with good form to demo improved body mechanics with work tasks    Time 6    Period Weeks    Status New    Target Date 02/09/22      PT LONG TERM GOAL #5   Title Pt will increase FOTO to at least 70    Baseline 51    Time 6    Period Weeks    Status New    Target Date 02/09/22                    Plan - 12/29/21 1623     Clinical Impression Statement Ms. West Carbo" Augello is a 56 y/o F presenting to OPPT due to exacerbation of her chronic L knee pain. Pt has knee OA at baseline and had fluid in her knee aspirated by Dr. Darene Lamer. On assessment, pt's demonstrates decreased L knee ROM, L LE weakness and multiple trigger points and taut musculature along ITB, hamstrings, gastroc/soleus, ant tib and extensor hallucis longus. L knee pain appears mostly along lateral knee into proximal lateral/posterior calf. Pt would highly benefit from PT to improve her overall mobility without pain (especially for work tasks).    Personal Factors and Comorbidities Age;Fitness;Time since onset of injury/illness/exacerbation    Examination-Activity Limitations Locomotion Level;Transfers;Squat;Stairs;Carry    Examination-Participation Restrictions Community Activity;Shop;Driving;Occupation    Stability/Clinical Decision Making Evolving/Moderate complexity    Clinical Decision Making Moderate    Rehab Potential Good    PT Frequency 2x / week    PT Duration 6 weeks    PT  Treatment/Interventions ADLs/Self Care Home Management;Aquatic Therapy;Cryotherapy;Electrical Stimulation;Iontophoresis 4mg /ml Dexamethasone;Moist Heat;DME Instruction;Ultrasound;Gait training;Stair training;Functional mobility training;Therapeutic activities;Therapeutic exercise;Balance training;Neuromuscular re-education;Manual techniques;Patient/family education;Dry needling;Passive range of motion;Taping;Vasopneumatic Device    PT Next Visit Plan Assess response to HEP -- modify/progress as able. Manual therapy/TPDN as indicated. Work on quad and hip strengthening.    PT Home Exercise Plan Access Code: [email or web address]    Consulted and Agree with Plan of Care Patient             Patient will benefit from skilled therapeutic intervention in order to improve the following deficits and impairments:  Decreased range of motion, Difficulty walking, Increased fascial restricitons, Abnormal gait, Increased muscle spasms, Decreased activity tolerance, Pain, Decreased balance, Improper body mechanics, Decreased mobility, Decreased strength, Hypomobility, Impaired flexibility  Visit Diagnosis: Chronic pain of left knee  Stiffness of left knee, not elsewhere classified  Muscle weakness (generalized)  Other abnormalities of gait and mobility  Difficulty in walking, not elsewhere classified     Problem List Patient Active Problem List   Diagnosis Date Noted   History of anaphylaxis 07/01/2021   Bilateral leg cramps 06/21/2021   Gastroesophageal reflux disease with esophagitis 06/21/2021   Menorrhagia 06/30/2019   Chest discomfort 06/30/2019   Family history of colon cancer 05/07/2019   Tendinopathy of right rotator cuff 10/22/2018   Vaginal discharge 10/21/2018   Burning with urination 10/21/2018   Arthritis of right acromioclavicular joint 09/16/2018   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   Chronic pain of left knee 08/30/2018   Closed nondisplaced fracture of base of  neck of left femur ([place]) 08/30/2018   Fall 08/25/2018   Acute pain of right shoulder 08/25/2018   DVT (deep venous thrombosis) ([place]) 08/16/2018   Primary osteoarthritis of left knee 06/05/2018   DDD (degenerative disc disease), lumbar 05/27/2018   Cough 02/13/2018   Plantar fasciitis, left 09/11/2017   Hoarseness or changing voice 09/11/2017   Left knee pain 07/11/2017   Multiple thyroid nodules 02/28/2017   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   Neuropathy 10/20/2016   De Quervain's tenosynovitis, right 10/20/2016   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   Motion sickness 07/16/2016   Skin tag 07/16/2016   PUD (peptic ulcer disease) 12/15/2015   Essential hypertension 12/06/2015   Plantar wart 05/12/2015   Thyroid goiter 12/22/2014   GERD (gastroesophageal reflux disease) 10/07/2014   Seasonal allergies 10/07/2014   Environmental allergies 10/07/2014   Class 2 obesity due to excess calories without serious comorbidity with body mass index (BMI) of 38.0 to 38.9 in adult 10/07/2014    [facility] April Gordy Levan, PT, DPT 12/29/2021, 4:32 PM  [facility] [employer], [place], [address] Phone: [phone]   Fax:  [phone]  Name: Joanne Winters MRN: [id number] Date of Birth: [birth year]

## 2022-01-09 ENCOUNTER — Ambulatory Visit: Payer: 59 | Admitting: Physical Therapy

## 2022-01-09 ENCOUNTER — Other Ambulatory Visit: Payer: Self-pay

## 2022-01-09 DIAGNOSIS — R262 Difficulty in walking, not elsewhere classified: Secondary | ICD-10-CM

## 2022-01-09 DIAGNOSIS — M25662 Stiffness of left knee, not elsewhere classified: Secondary | ICD-10-CM

## 2022-01-09 DIAGNOSIS — M25562 Pain in left knee: Secondary | ICD-10-CM

## 2022-01-09 DIAGNOSIS — M6281 Muscle weakness (generalized): Secondary | ICD-10-CM

## 2022-01-09 DIAGNOSIS — M7122 Synovial cyst of popliteal space [Baker], left knee: Secondary | ICD-10-CM | POA: Diagnosis not present

## 2022-01-09 DIAGNOSIS — G8929 Other chronic pain: Secondary | ICD-10-CM

## 2022-01-09 DIAGNOSIS — R2689 Other abnormalities of gait and mobility: Secondary | ICD-10-CM

## 2022-01-09 NOTE — Therapy (Signed)
[facility] [place] [address] [place], [place], [address] Phone: [phone]   Fax:  [phone]  Physical Therapy Treatment  Patient Details  Name: Joanne Winters MRN: [id number] Date of Birth: 01/20/1966 Referring Provider (PT): Monica Bectonhekkekandam, Thomas J, MD   Encounter Date: 01/09/2022   PT End of Session - 01/09/22 1606     Visit Number 2    Number of Visits 12    Date for PT Re-Evaluation 02/09/22    Authorization Type UHC    PT Start Time 1603    PT Stop Time 1645    PT Time Calculation (min) 42 min    Activity Tolerance Patient tolerated treatment well    [facility] for tasks assessed/performed             Past Medical History:  Diagnosis Date   Essential hypertension 12/06/2015   GERD (gastroesophageal reflux disease)    Menorrhagia    Morbid obesity (HCC) 12/22/2014   Seasonal allergies     Past Surgical History:  Procedure Laterality Date   ABLATION  2013   CESAREAN SECTION     gall stone  1995    There were no vitals filed for this visit.   Subjective Assessment - 01/09/22 1606     Subjective Pt reports her work schedule has been decreased to 8 hour days. Pt states most of the problem is when she has to go up/down multiple steps at home. Pt states she's been doing the exercises at home and they have gotten easier.    Pertinent History Knee OA, prior Baker's cyst    Limitations Walking;Standing;Lifting;House hold activities    How long can you sit comfortably? >4 hours    How long can you stand comfortably? All day if she's not doing anything strenous    How long can you walk comfortably? ~20 min she can feel tightness at work; at home not as much    Patient Stated Goals Improve mobility with less pain    Currently in Pain? No/denies    Pain Onset More than a month ago                [facility] - 01/09/22 0001       Assessment   Medical Diagnosis M17.12 (ICD-10-CM)  - Primary osteoarthritis of left knee    Referring Provider (PT) Monica Bectonhekkekandam, Thomas J, MD                           [facility]/Exercise - 01/09/22 0001       Knee/Hip Exercises: Stretches   Quad Stretch Right;Left;2 reps;30 seconds    Piriformis Stretch Right;Left;30 seconds    Gastroc Stretch 30 seconds    Soleus Stretch 30 seconds      Knee/Hip Exercises: Aerobic   Tread Mill 1.8 mph x 5 min for cool down    Recumbent Bike L1 x 5 min      Knee/Hip Exercises: Standing   Forward Step Up 5 reps;Hand Hold: 2;Step Height: 6"    Other Standing Knee Exercises lateral band walk 2x10 red tband    Other Standing Knee Exercises backward monster walk red tband 2x10      Knee/Hip Exercises: Seated   Long Arc Quad Strengthening;Right;Left;2 sets;10 reps    Long Arc Quad Limitations red tband    Sit to [employer] CorporationSand 10 reps;without UE support  PT Education - 01/09/22 1639     Education Details Discussed proper knee mechanics on steps to decrease anterior knee pain. Discussed HEP updates.    Person(s) Educated Patient    Methods Explanation;Demonstration;Tactile cues;Verbal cues;Handout    Comprehension Verbalized understanding;Returned demonstration;Verbal cues required;Tactile cues required                 PT Long Term Goals - 12/29/21 1629       PT LONG TERM GOAL #1   Title Pt will be independent with HEP    Time 6    Period Weeks    Status New    Target Date 02/09/22      PT LONG TERM GOAL #2   Title Pt will report decrease in max pain to </=4/10 during all activity    Baseline 8/10 at worst    Time 6    Period Weeks    Status New    Target Date 02/09/22      PT LONG TERM GOAL #3   Title Pt will be able to walk at least 30 minutes at work with 50% less pain    Time 6    Period Weeks    Status New    Target Date 02/09/22      PT LONG TERM GOAL #4   Title Pt will be able to squat at least 45 lbs with good  form to demo improved body mechanics with work tasks    Time 6    Period Weeks    Status New    Target Date 02/09/22      PT LONG TERM GOAL #5   Title Pt will increase FOTO to at least 70    Baseline 51    Time 6    Period Weeks    Status New    Target Date 02/09/22                   Plan - 01/09/22 1615     Clinical Impression Statement Pt with improved L knee ROM from initial evaluation. Treatment session focused on updating pt's HEP as she is demonstrating good progress. Worked on knee mechanics during sit to stands and stairs.    Personal Factors and Comorbidities Age;Fitness;Time since onset of injury/illness/exacerbation    Examination-Activity Limitations Locomotion Level;Transfers;Squat;Stairs;Carry    Examination-Participation Restrictions Community Activity;Shop;Driving;Occupation    Stability/Clinical Decision Making Evolving/Moderate complexity    Rehab Potential Good    PT Frequency 2x / week    PT Duration 6 weeks    PT Treatment/Interventions ADLs/Self Care Home Management;Aquatic Therapy;Cryotherapy;Electrical Stimulation;Iontophoresis 4mg /ml Dexamethasone;Moist Heat;DME Instruction;Ultrasound;Gait training;Stair training;Functional mobility training;Therapeutic activities;Therapeutic exercise;Balance training;Neuromuscular re-education;Manual techniques;Patient/family education;Dry needling;Passive range of motion;Taping;Vasopneumatic Device    PT Next Visit Plan Assess response to HEP -- modify/progress as able. Manual therapy/TPDN as indicated. Work on quad and hip strengthening.    PT Home Exercise Plan Access Code: [email or web address]    Consulted and Agree with Plan of Care Patient             Patient will benefit from skilled therapeutic intervention in order to improve the following deficits and impairments:  Decreased range of motion, Difficulty walking, Increased fascial restricitons, Abnormal gait, Increased muscle spasms, Decreased activity tolerance,  Pain, Decreased balance, Improper body mechanics, Decreased mobility, Decreased strength, Hypomobility, Impaired flexibility  Visit Diagnosis: Chronic pain of left knee  Stiffness of left knee, not elsewhere classified  Muscle weakness (generalized)  Other abnormalities of gait and mobility  Difficulty  in walking, not elsewhere classified     Problem List Patient Active Problem List   Diagnosis Date Noted   History of anaphylaxis 07/01/2021   Bilateral leg cramps 06/21/2021   Gastroesophageal reflux disease with esophagitis 06/21/2021   Menorrhagia 06/30/2019   Chest discomfort 06/30/2019   Family history of colon cancer 05/07/2019   Tendinopathy of right rotator cuff 10/22/2018   Vaginal discharge 10/21/2018   Burning with urination 10/21/2018   Arthritis of right acromioclavicular joint 09/16/2018   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   Chronic pain of left knee 08/30/2018   Closed nondisplaced fracture of base of neck of left femur (HCC) 08/30/2018   Fall 08/25/2018   Acute pain of right shoulder 08/25/2018   DVT (deep venous thrombosis) (HCC) 08/16/2018   Primary osteoarthritis of left knee 06/05/2018   DDD (degenerative disc disease), lumbar 05/27/2018   Cough 02/13/2018   Plantar fasciitis, left 09/11/2017   Hoarseness or changing voice 09/11/2017   Left knee pain 07/11/2017   Multiple thyroid nodules 02/28/2017   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   Neuropathy 10/20/2016   De Quervain's tenosynovitis, right 10/20/2016   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   Motion sickness 07/16/2016   Skin tag 07/16/2016   PUD (peptic ulcer disease) 12/15/2015   Essential hypertension 12/06/2015   Plantar wart 05/12/2015   Thyroid goiter 12/22/2014   GERD (gastroesophageal reflux disease) 10/07/2014   Seasonal allergies 10/07/2014   Environmental allergies 10/07/2014   Class 2 obesity due to excess calories without serious comorbidity with body mass index (BMI) of  38.0 to 38.9 in adult 10/07/2014    [facility] April Dell Ponto, PT, DPT 01/09/2022, 4:46 PM  [facility] [address] [place], [place], [address] Phone: [phone]   Fax:  [phone]  Name: Lowen Mansouri MRN: [id number] Date of Birth: 03-04-1966

## 2022-01-12 ENCOUNTER — Other Ambulatory Visit: Payer: Self-pay

## 2022-01-12 ENCOUNTER — Ambulatory Visit: Payer: 59 | Admitting: Physical Therapy

## 2022-01-12 DIAGNOSIS — M6281 Muscle weakness (generalized): Secondary | ICD-10-CM

## 2022-01-12 DIAGNOSIS — M25562 Pain in left knee: Secondary | ICD-10-CM

## 2022-01-12 DIAGNOSIS — M7122 Synovial cyst of popliteal space [Baker], left knee: Secondary | ICD-10-CM | POA: Diagnosis not present

## 2022-01-12 DIAGNOSIS — G8929 Other chronic pain: Secondary | ICD-10-CM

## 2022-01-12 DIAGNOSIS — R2689 Other abnormalities of gait and mobility: Secondary | ICD-10-CM

## 2022-01-12 DIAGNOSIS — R262 Difficulty in walking, not elsewhere classified: Secondary | ICD-10-CM

## 2022-01-12 DIAGNOSIS — M25662 Stiffness of left knee, not elsewhere classified: Secondary | ICD-10-CM

## 2022-01-12 NOTE — Therapy (Signed)
[facility] [place] [address] [place], [place], [address] Phone: [phone]   Fax:  [phone]  Physical Therapy Treatment  Patient Details  Name: Joanne Winters MRN: [id number] Date of Birth: 23-Oct-1966 Referring Provider (PT): Monica Becton, MD   Encounter Date: 01/12/2022   PT End of Session - 01/12/22 1702     Visit Number 3    Number of Visits 12    Date for PT Re-Evaluation 02/09/22    Authorization Type UHC    PT Start Time 1702    PT Stop Time 1745    PT Time Calculation (min) 43 min    Activity Tolerance Patient tolerated treatment well    Behavior During Therapy [facility] for tasks assessed/performed             Past Medical History:  Diagnosis Date   Essential hypertension 12/06/2015   GERD (gastroesophageal reflux disease)    Menorrhagia    Morbid obesity (HCC) 12/22/2014   Seasonal allergies     Past Surgical History:  Procedure Laterality Date   ABLATION  2013   CESAREAN SECTION     gall stone  1995    There were no vitals filed for this visit.   Subjective Assessment - 01/12/22 1704     Subjective Pt reports she thinks her knee is getting better. Pt states she's not too sore after exercises. She did try to perform steps with good mechanics at work.    Pertinent History Knee OA, prior Baker's cyst    Limitations Walking;Standing;Lifting;House hold activities    How long can you sit comfortably? >4 hours    How long can you stand comfortably? All day if she's not doing anything strenous    How long can you walk comfortably? ~20 min she can feel tightness at work; at home not as much    Patient Stated Goals Improve mobility with less pain    Currently in Pain? No/denies    Pain Onset More than a month ago                [facility] PT Assessment - 01/12/22 0001       Assessment   Medical Diagnosis M17.12 (ICD-10-CM) - Primary osteoarthritis of left knee    Referring Provider (PT)  Monica Becton, MD    Hand Dominance Right      AROM   Right Knee Extension 0    Right Knee Flexion 120    Left Knee Extension 0    Left Knee Flexion 120      Strength   Left Hip Flexion 4+/5    Left Hip Extension 4+/5    Left Hip ABduction 3+/5    Left Hip ADduction 4-/5                           [facility] Adult PT Treatment/Exercise - 01/12/22 0001       Knee/Hip Exercises: Aerobic   Recumbent Bike L2 x 5 min      Knee/Hip Exercises: Standing   Lateral Step Up 2 sets;10 reps;Right;Left;Step Height: 6"    Forward Step Up Right;Left;2 sets;10 reps;Step Height: 6"    Forward Step Up Limitations Runner's step up    Other Standing Knee Exercises lateral band walk 2x10 green tband    Other Standing Knee Exercises backward monster walk green tband 2x10      Knee/Hip Exercises: Seated   Sit to Sand 2 sets;10  reps;without UE support   10# KB + hip hinge     Modalities   Modalities Vasopneumatic      Vasopneumatic   Number Minutes Vasopneumatic  10 minutes    Vasopnuematic Location  Knee    Vasopneumatic Pressure Low    Vasopneumatic Temperature  34 deg                           PT Long Term Goals - 12/29/21 1629       PT LONG TERM GOAL #1   Title Pt will be independent with HEP    Time 6    Period Weeks    Status New    Target Date 02/09/22      PT LONG TERM GOAL #2   Title Pt will report decrease in max pain to </=4/10 during all activity    Baseline 8/10 at worst    Time 6    Period Weeks    Status New    Target Date 02/09/22      PT LONG TERM GOAL #3   Title Pt will be able to walk at least 30 minutes at work with 50% less pain    Time 6    Period Weeks    Status New    Target Date 02/09/22      PT LONG TERM GOAL #4   Title Pt will be able to squat at least 45 lbs with good form to demo improved body mechanics with work tasks    Time 6    Period Weeks    Status New    Target Date 02/09/22      PT LONG TERM  GOAL #5   Title Pt will increase FOTO to at least 70    Baseline 51    Time 6    Period Weeks    Status New    Target Date 02/09/22                   Plan - 01/12/22 1717     Clinical Impression Statement Pt with increasing strength and ROM. Progressed band exercises to green tband with good pt tolerance. Continued to work on stairs this session.    Personal Factors and Comorbidities Age;Fitness;Time since onset of injury/illness/exacerbation    Examination-Activity Limitations Locomotion Level;Transfers;Squat;Stairs;Carry    Examination-Participation Restrictions Community Activity;Shop;Driving;Occupation    Stability/Clinical Decision Making Evolving/Moderate complexity    Rehab Potential Good    PT Frequency 2x / week    PT Duration 6 weeks    PT Treatment/Interventions ADLs/Self Care Home Management;Aquatic Therapy;Cryotherapy;Electrical Stimulation;Iontophoresis 4mg /ml Dexamethasone;Moist Heat;DME Instruction;Ultrasound;Gait training;Stair training;Functional mobility training;Therapeutic activities;Therapeutic exercise;Balance training;Neuromuscular re-education;Manual techniques;Patient/family education;Dry needling;Passive range of motion;Taping;Vasopneumatic Device    PT Next Visit Plan Assess response to HEP -- modify/progress as able. Manual therapy/TPDN as indicated. Work on quad and hip strengthening.    PT Home Exercise Plan Access Code: [email or web address]    Consulted and Agree with Plan of Care Patient             Patient will benefit from skilled therapeutic intervention in order to improve the following deficits and impairments:  Decreased range of motion, Difficulty walking, Increased fascial restricitons, Abnormal gait, Increased muscle spasms, Decreased activity tolerance, Pain, Decreased balance, Improper body mechanics, Decreased mobility, Decreased strength, Hypomobility, Impaired flexibility  Visit Diagnosis: Chronic pain of left knee  Stiffness of left  knee, not elsewhere classified  Muscle weakness (generalized)  Other abnormalities  of gait and mobility  Difficulty in walking, not elsewhere classified     Problem List Patient Active Problem List   Diagnosis Date Noted   History of anaphylaxis 07/01/2021   Bilateral leg cramps 06/21/2021   Gastroesophageal reflux disease with esophagitis 06/21/2021   Menorrhagia 06/30/2019   Chest discomfort 06/30/2019   Family history of colon cancer 05/07/2019   Tendinopathy of right rotator cuff 10/22/2018   Vaginal discharge 10/21/2018   Burning with urination 10/21/2018   Arthritis of right acromioclavicular joint 09/16/2018   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   Chronic pain of left knee 08/30/2018   Closed nondisplaced fracture of base of neck of left femur (HCC) 08/30/2018   Fall 08/25/2018   Acute pain of right shoulder 08/25/2018   DVT (deep venous thrombosis) (HCC) 08/16/2018   Primary osteoarthritis of left knee 06/05/2018   DDD (degenerative disc disease), lumbar 05/27/2018   Cough 02/13/2018   Plantar fasciitis, left 09/11/2017   Hoarseness or changing voice 09/11/2017   Left knee pain 07/11/2017   Multiple thyroid nodules 02/28/2017   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   Neuropathy 10/20/2016   De Quervain's tenosynovitis, right 10/20/2016   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   Motion sickness 07/16/2016   Skin tag 07/16/2016   PUD (peptic ulcer disease) 12/15/2015   Essential hypertension 12/06/2015   Plantar wart 05/12/2015   Thyroid goiter 12/22/2014   GERD (gastroesophageal reflux disease) 10/07/2014   Seasonal allergies 10/07/2014   Environmental allergies 10/07/2014   Class 2 obesity due to excess calories without serious comorbidity with body mass index (BMI) of 38.0 to 38.9 in adult 10/07/2014    [facility] April Dell PontoMa L Wesam Gearhart, PT, DPT 01/12/2022, 5:35 PM  [facility] [address] [place], [place], [address] Phone: [phone]   Fax:  [phone]  Name: Joanne Winters MRN: [id number] Date of Birth: 01/23/1966

## 2022-01-16 ENCOUNTER — Ambulatory Visit: Payer: 59 | Admitting: Physical Therapy

## 2022-01-19 ENCOUNTER — Ambulatory Visit: Payer: 59 | Attending: Sports Medicine | Admitting: Physical Therapy

## 2022-01-19 ENCOUNTER — Other Ambulatory Visit: Payer: Self-pay

## 2022-01-19 DIAGNOSIS — M6281 Muscle weakness (generalized): Secondary | ICD-10-CM | POA: Insufficient documentation

## 2022-01-19 DIAGNOSIS — M25562 Pain in left knee: Secondary | ICD-10-CM | POA: Insufficient documentation

## 2022-01-19 DIAGNOSIS — M25662 Stiffness of left knee, not elsewhere classified: Secondary | ICD-10-CM | POA: Insufficient documentation

## 2022-01-19 DIAGNOSIS — R2689 Other abnormalities of gait and mobility: Secondary | ICD-10-CM | POA: Insufficient documentation

## 2022-01-19 DIAGNOSIS — G8929 Other chronic pain: Secondary | ICD-10-CM | POA: Insufficient documentation

## 2022-01-19 DIAGNOSIS — R262 Difficulty in walking, not elsewhere classified: Secondary | ICD-10-CM | POA: Insufficient documentation

## 2022-01-19 NOTE — Therapy (Addendum)
[place], [place], [address] Phone: [phone]   Fax:  [phone]  Physical Therapy Treatment and Discharge  Patient Details  Name: Joanne Winters MRN: [id number] Date of Birth: [birth year] Referring Provider (PT): Silverio Decamp, MD  PHYSICAL THERAPY DISCHARGE SUMMARY  Visits from Start of Care: 4  Current functional level related to goals / functional outcomes: See below. Pt has been doing well with less pain.    Remaining deficits: See below   Education / Equipment: See below.   Patient agrees to discharge. Patient goals were partially met. Patient is being discharged due to  Pt being out of town for an extended period; not able to come to therapy sessions.   Encounter Date: 01/19/2022   PT End of Session - 01/19/22 1704     Visit Number 4    Number of Visits 12    Date for PT Re-Evaluation 02/09/22    Authorization Type UHC    PT Start Time 1700    PT Stop Time 6384    PT Time Calculation (min) 40 min    Activity Tolerance Patient tolerated treatment well    Behavior During Therapy [facility] for tasks assessed/performed             Past Medical History:  Diagnosis Date   Essential hypertension 12/06/2015   GERD (gastroesophageal reflux disease)    Menorrhagia    Morbid obesity ([place]) 12/22/2014   Seasonal allergies     Past Surgical History:  Procedure Laterality Date   ABLATION  2013   CESAREAN SECTION     gall stone  1995    There were no vitals filed for this visit.   Subjective Assessment - 01/19/22 1704     Subjective Pt states she spent most of last week taking care of her mom. Has not been able to do too many of her exercises. Just got back home last night and worked today.    Pertinent History Knee OA, prior Baker's cyst    Limitations Walking;Standing;Lifting;House hold activities    How long can you sit comfortably? >4 hours    How long can you stand  comfortably? All day if she's not doing anything strenous    How long can you walk comfortably? ~20 min she can feel tightness at work; at home not as much    Patient Stated Goals Improve mobility with less pain    Currently in Pain? No/denies    Pain Onset More than a month ago                               [facility] Adult PT Treatment/Exercise - 01/19/22 0001       Knee/Hip Exercises: Aerobic   Recumbent Bike L2 x 5 min      Knee/Hip Exercises: Machines for Strengthening   Cybex Knee Extension DL 2x10 @ 2 plates    Cybex Knee Flexion --    Total Gym Leg Press DL @ 6 plates 2x10; SL @ 6 plates 2x10 each      Knee/Hip Exercises: Standing   Knee Flexion Strengthening;Right;Left;2 sets;10 reps    Knee Flexion Limitations red tband    Forward Lunges Right;Left;10 reps    Forward Lunges Limitations mini lunge    Other Standing Knee Exercises hip adduction sliding foot on washcloth with red tband 2x10 each side      Knee/Hip Exercises: Seated  Sit to Sand 2 sets;10 reps;without UE support                          PT Long Term Goals - 12/29/21 1629       PT LONG TERM GOAL #1   Title Pt will be independent with HEP    Time 6    Period Weeks    Status New    Target Date 02/09/22      PT LONG TERM GOAL #2   Title Pt will report decrease in max pain to </=4/10 during all activity    Baseline 8/10 at worst    Time 6    Period Weeks    Status New    Target Date 02/09/22      PT LONG TERM GOAL #3   Title Pt will be able to walk at least 30 minutes at work with 50% less pain    Time 6    Period Weeks    Status New    Target Date 02/09/22      PT LONG TERM GOAL #4   Title Pt will be able to squat at least 45 lbs with good form to demo improved body mechanics with work tasks    Time 6    Period Weeks    Status New    Target Date 02/09/22      PT LONG TERM GOAL #5   Title Pt will increase FOTO to at least 70    Baseline 51    Time 6     Period Weeks    Status New    Target Date 02/09/22                   Plan - 01/19/22 1718     Clinical Impression Statement Continued to progress strengthening this session. Initiated Patent examiner. Did not update HEP as pt has not had a chance to practice her prior HEP last week.    Personal Factors and Comorbidities Age;Fitness;Time since onset of injury/illness/exacerbation    Examination-Activity Limitations Locomotion Level;Transfers;Squat;Stairs;Carry    Examination-Participation Restrictions Community Activity;Shop;Driving;Occupation    Stability/Clinical Decision Making Evolving/Moderate complexity    Rehab Potential Good    PT Frequency 2x / week    PT Duration 6 weeks    PT Treatment/Interventions ADLs/Self Care Home Management;Aquatic Therapy;Cryotherapy;Electrical Stimulation;Iontophoresis 41m/ml Dexamethasone;Moist Heat;DME Instruction;Ultrasound;Gait training;Stair training;Functional mobility training;Therapeutic activities;Therapeutic exercise;Balance training;Neuromuscular re-education;Manual techniques;Patient/family education;Dry needling;Passive range of motion;Taping;Vasopneumatic Device    PT Next Visit Plan Assess response to HEP -- modify/progress as able. Manual therapy/TPDN as indicated. Work on quad and hip strengthening.    PT Home Exercise Plan Access Code: [email or web address]    Consulted and Agree with Plan of Care Patient             Patient will benefit from skilled therapeutic intervention in order to improve the following deficits and impairments:  Decreased range of motion, Difficulty walking, Increased fascial restricitons, Abnormal gait, Increased muscle spasms, Decreased activity tolerance, Pain, Decreased balance, Improper body mechanics, Decreased mobility, Decreased strength, Hypomobility, Impaired flexibility  Visit Diagnosis: Chronic pain of left knee  Stiffness of left knee, not elsewhere classified  Muscle weakness  (generalized)  Other abnormalities of gait and mobility  Difficulty in walking, not elsewhere classified     Problem List Patient Active Problem List   Diagnosis Date Noted   History of anaphylaxis 07/01/2021   Bilateral leg cramps 06/21/2021   Gastroesophageal reflux disease  with esophagitis 06/21/2021   Menorrhagia 06/30/2019   Chest discomfort 06/30/2019   Family history of colon cancer 05/07/2019   Tendinopathy of right rotator cuff 10/22/2018   Vaginal discharge 10/21/2018   Burning with urination 10/21/2018   Arthritis of right acromioclavicular joint 09/16/2018   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   Chronic pain of left knee 08/30/2018   Closed nondisplaced fracture of base of neck of left femur (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   Motion sickness 07/16/2016   Skin tag 07/16/2016   PUD (peptic ulcer disease) 12/15/2015   Essential hypertension 12/06/2015   Plantar wart 05/12/2015   Thyroid goiter 12/22/2014   GERD (gastroesophageal reflux disease) 10/07/2014   Seasonal allergies 10/07/2014   Environmental allergies 10/07/2014   Class 2 obesity due to excess calories without serious comorbidity with body mass index (BMI) of 38.0 to 38.9 in adult 10/07/2014    [facility] April Gordy Levan, PT, DPT 01/19/2022, 5:38 PM  [facility] [place] [address] [place], [place], [address] Phone: [phone]   Fax:   [phone]  Name: Joanne Winters MRN: [id number] Date of Birth: [birth year]

## 2022-01-20 ENCOUNTER — Ambulatory Visit: Payer: 59 | Admitting: Sports Medicine

## 2022-01-23 ENCOUNTER — Encounter: Payer: 59 | Admitting: Physical Therapy

## 2022-01-26 ENCOUNTER — Encounter: Payer: 59 | Admitting: Physical Therapy

## 2022-03-10 ENCOUNTER — Encounter: Payer: Self-pay | Admitting: Neurology

## 2022-03-10 ENCOUNTER — Ambulatory Visit: Payer: 59 | Admitting: Physician Assistant

## 2022-03-10 ENCOUNTER — Other Ambulatory Visit: Payer: Self-pay

## 2022-03-10 VITALS — BP 128/67 | HR 74 | Ht 61.0 in | Wt 213.0 lb

## 2022-03-10 DIAGNOSIS — Z6833 Body mass index (BMI) 33.0-33.9, adult: Secondary | ICD-10-CM

## 2022-03-10 DIAGNOSIS — M25561 Pain in right knee: Secondary | ICD-10-CM | POA: Diagnosis not present

## 2022-03-10 DIAGNOSIS — E6609 Other obesity due to excess calories: Secondary | ICD-10-CM | POA: Diagnosis not present

## 2022-03-10 MED ORDER — MELOXICAM 15 MG PO TABS: 15.0000 mg | ORAL_TABLET | Freq: Every day | ORAL | 0 refills | Status: DC

## 2022-03-10 NOTE — Telephone Encounter (Signed)
Mychart message sent to patient.

## 2022-03-10 NOTE — Progress Notes (Signed)
? ?Subjective:  ? ? Patient ID: Joanne Winters, female    DOB: [birth year], 56 y.o.   MRN: [id number] ? ?HPI ?Pt is a 56 yo female who has bilateral OA of both knees who presents to the clinic with right knee pain worsening for the last 3 months. No injury. It is getting harder and harder to bear weight and feels like knee is going to give out on her at times. She is on her feet a lot of her job. She is really not doing a whole lot for it.  ? ?.. ?Active Ambulatory Problems  ?  Diagnosis Date Noted  ? GERD (gastroesophageal reflux disease) 10/07/2014  ? Seasonal allergies 10/07/2014  ? Environmental allergies 10/07/2014  ? Class 2 obesity due to excess calories without serious comorbidity with body mass index (BMI) of 38.0 to 38.9 in adult 10/07/2014  ? Thyroid goiter 12/22/2014  ? Plantar wart 05/12/2015  ? Essential hypertension 12/06/2015  ? PUD (peptic ulcer disease) 12/15/2015  ? 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 Motion sickness 07/16/2016  ? Skin tag 07/16/2016  ? Neuropathy 10/20/2016  ? De Quervain's tenosynovitis, right 10/20/2016  ? 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 Multiple thyroid nodules 02/28/2017  ? Left knee pain 07/11/2017  ? Plantar fasciitis, left 09/11/2017  ? Hoarseness or changing voice 09/11/2017  ? Cough 02/13/2018  ? DDD (degenerative disc disease), lumbar 05/27/2018  ? Primary osteoarthritis of both knees 06/05/2018  ? DVT (deep venous thrombosis) (HCC) 08/16/2018  ? Fall 08/25/2018  ? Acute pain of right shoulder 08/25/2018  ? 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  ? Chronic pain of left knee 08/30/2018  ? Closed nondisplaced fracture of base of neck of left femur (HCC) 08/30/2018  ? Arthritis of right acromioclavicular joint 09/16/2018  ? Vaginal discharge 10/21/2018  ? Burning with urination 10/21/2018  ? Tendinopathy of right rotator cuff 10/22/2018  ? Family history of colon cancer 05/07/2019  ? Menorrhagia 06/30/2019  ? Chest discomfort 06/30/2019  ? Bilateral leg cramps  06/21/2021  ? Gastroesophageal reflux disease with esophagitis 06/21/2021  ? History of anaphylaxis 07/01/2021  ? ?Resolved Ambulatory Problems  ?  Diagnosis Date Noted  ? Lateral epicondylitis of right elbow 12/22/2014  ? Morbid obesity (HCC) 12/22/2014  ? Abnormal weight gain 03/01/2015  ? Right calf pain 04/26/2015  ? Elevated blood pressure 04/26/2015  ? Pain in lower limb 05/26/2015  ? Heel spur, right 09/11/2017  ? Throat tightness 09/11/2017  ? Spasm of muscle, back 02/28/2018  ? Hamstring strain, left, subsequent encounter 05/24/2018  ? ?No Additional Past Medical History  ? ? ?Review of Systems ?See HPI.  ?   ?Objective:  ? Physical Exam ?Vitals reviewed.  ?Constitutional:   ?   Appearance: Normal appearance. She is obese.  ?HENT:  ?   Head: Normocephalic.  ?Cardiovascular:  ?   Rate and Rhythm: Normal rate and regular rhythm.  ?   Pulses: Normal pulses.  ?Musculoskeletal:  ?   Right lower leg: No edema.  ?   Left lower leg: No edema.  ?   Comments: Right knee swollen without redness or warmth.  ?Normal extension and flexion ?Tenderness in joint spaces to palpation ?Hard to stand and bear weight ?More pain in the medial joint space  ?Neurological:  ?   General: No focal deficit present.  ?   Mental Status: She is alert and oriented to person, place, and time.  ?Psychiatric:     ?   Mood and  Affect: Mood normal.  ? ? ? ? ? ?   ?Assessment & Plan:  ?..Joanne Winters was seen today for leg pain. ? ?Diagnoses and all orders for this visit: ? ?Acute pain of right knee ?-     meloxicam (MOBIC) 15 MG tablet; Take 1 tablet (15 mg total) by mouth daily. ? ?Class 1 obesity due to excess calories without serious comorbidity with body mass index (BMI) of 33.0 to 33.9 in adult ? ? ?Start mobic and follow up with Dr. Karie Schwalbe for further management.  ?Ice and support knee as needed and especially at work ?Work on weight loss  ? ?

## 2022-03-11 ENCOUNTER — Other Ambulatory Visit: Payer: Self-pay | Admitting: Physician Assistant

## 2022-03-11 DIAGNOSIS — F40243 Fear of flying: Secondary | ICD-10-CM

## 2022-03-14 ENCOUNTER — Other Ambulatory Visit: Payer: Self-pay

## 2022-03-14 ENCOUNTER — Ambulatory Visit: Payer: 59 | Admitting: Sports Medicine

## 2022-03-14 DIAGNOSIS — M170 Bilateral primary osteoarthritis of knee: Secondary | ICD-10-CM

## 2022-03-14 NOTE — Assessment & Plan Note (Signed)
Pleasant 56 year old female returns, I last treated her at the end of last year for left knee osteoarthritis with an aspiration, injection, physical therapy and she had complete resolution of discomfort, unfortunately she has been having increasing pain right knee, medial joint line. ?On exam she does have joint line pain and effusion. ?Sounds like meloxicam was started but she had not yet picked it up as the pharmacy was out, before we consider additional interventional treatment we will have her do 2 weeks of meloxicam, if this fails she can return for an aspiration and injection, I did give her the knee conditioning exercises today. ?

## 2022-03-14 NOTE — Progress Notes (Signed)
? ? ?  Procedures performed today:   ? ?None. ? ?Independent interpretation of notes and tests performed by another provider:  ? ?None. ? ?Brief History, Exam, Impression, and Recommendations:   ? ?Primary osteoarthritis of both knees ?Pleasant 56 year old female returns, I last treated her at the end of last year for left knee osteoarthritis with an aspiration, injection, physical therapy and she had complete resolution of discomfort, unfortunately she has been having increasing pain right knee, medial joint line. ?On exam she does have joint line pain and effusion. ?Sounds like meloxicam was started but she had not yet picked it up as the pharmacy was out, before we consider additional interventional treatment we will have her do 2 weeks of meloxicam, if this fails she can return for an aspiration and injection, I did give her the knee conditioning exercises today. ? ? ? ?___________________________________________ ?Gwen Her. Dianah Field, M.D., ABFM., CAQSM. ?Primary Care and Sports Medicine ?[place] ? ?Adjunct Instructor of Family Medicine  ?[employer] ?

## 2022-03-15 MED ORDER — ALPRAZOLAM 0.5 MG PO TABS: ORAL_TABLET | ORAL | 0 refills | Status: DC

## 2022-03-16 ENCOUNTER — Encounter: Payer: Self-pay | Admitting: Physician Assistant

## 2022-03-28 ENCOUNTER — Ambulatory Visit (INDEPENDENT_AMBULATORY_CARE_PROVIDER_SITE_OTHER): Payer: 59

## 2022-03-28 ENCOUNTER — Ambulatory Visit: Payer: 59 | Admitting: Sports Medicine

## 2022-03-28 DIAGNOSIS — M25561 Pain in right knee: Secondary | ICD-10-CM

## 2022-03-28 DIAGNOSIS — M170 Bilateral primary osteoarthritis of knee: Secondary | ICD-10-CM

## 2022-03-28 MED ORDER — MELOXICAM 15 MG PO TABS: 15.0000 mg | ORAL_TABLET | Freq: Every day | ORAL | 3 refills | Status: DC

## 2022-03-28 NOTE — Progress Notes (Signed)
? ? ?  Procedures performed today:   ? ?Procedure: Real-time Ultrasound Guided injection of the right knee ?Device: Samsung HS60  ?Verbal informed consent obtained.  ?Time-out conducted.  ?Noted no overlying erythema, induration, or other signs of local infection.  ?Skin prepped in a sterile fashion.  ?Local anesthesia: Topical Ethyl chloride.  ?With sterile technique and under real time ultrasound guidance: Noted moderate effusion, 1 cc Kenalog 40, 2 cc lidocaine, 2 cc bupivacaine injected easily ?Completed without difficulty  ?Advised to call if fevers/chills, erythema, induration, drainage, or persistent bleeding.  ?Images permanently stored and available for review in PACS.  ?Impression: Technically successful ultrasound guided injection. ? ?Independent interpretation of notes and tests performed by another provider:  ? ?None. ? ?Brief History, Exam, Impression, and Recommendations:   ? ?Primary osteoarthritis of both knees ?Pleasant 56 year old female, known osteoarthritis, we treated her at the end of 2022 for left knee osteoarthritis with an injection and she did very good, she had complete resolution of pain, unfortunately was having pain on the contralateral side, we did start meloxicam, she lost it after 2 days, I will call in some more, but due to persistent discomfort I am going to proceed with an joint injection today. ?Return to see me in a month. ? ? ? ?___________________________________________ ?Ihor Austin. Benjamin Stain, M.D., ABFM., CAQSM. ?Primary Care and Sports Medicine ?[facility] ? ?Adjunct Instructor of Family Medicine  ?[employer] ?

## 2022-03-28 NOTE — Assessment & Plan Note (Signed)
Pleasant 56 year old female, known osteoarthritis, we treated her at the end of 2022 for left knee osteoarthritis with an injection and she did very good, she had complete resolution of pain, unfortunately was having pain on the contralateral side, we did start meloxicam, she lost it after 2 days, I will call in some more, but due to persistent discomfort I am going to proceed with an joint injection today. ?Return to see me in a month. ?

## 2022-04-20 ENCOUNTER — Other Ambulatory Visit: Payer: Self-pay | Admitting: Physician Assistant

## 2022-04-20 DIAGNOSIS — I10 Essential (primary) hypertension: Secondary | ICD-10-CM

## 2022-04-25 ENCOUNTER — Ambulatory Visit: Payer: 59 | Admitting: Sports Medicine

## 2022-04-25 DIAGNOSIS — M170 Bilateral primary osteoarthritis of knee: Secondary | ICD-10-CM | POA: Diagnosis not present

## 2022-04-25 NOTE — Progress Notes (Addendum)
? ? ?  Procedures performed today:   ? ?None. ? ?Independent interpretation of notes and tests performed by another provider:  ? ?None. ? ?Brief History, Exam, Impression, and Recommendations:   ? ?Primary osteoarthritis of both knees ?This is a very pleasant 56 year old female, she has bilateral knee pain and osteoarthritis, we treated her in December 2022 with a left knee injection, it is doing okay, she had a right knee injection at the last visit, unfortunately still has persistent swelling and pain, mostly medial joint line. ?Meloxicam and Tylenol together seem to control her pain moderately. ?Considering persistence of pain in spite of an injection, as well as greater than 6 weeks of physician directed conservative treatment I will go ahead and get her an MRI. ?If she has significant meniscal tearing we will get her arthroscopy, if not we will get her approved for viscosupplementation. ? ?Update: Probably does have significant meniscal tearing as well as some osteoarthritis, I would like Dr. Luiz Blare to weigh in regarding arthroscopy versus arthroplasty. ? ? ? ?___________________________________________ ?Ihor Austin. Benjamin Stain, M.D., ABFM., CAQSM. ?Primary Care and Sports Medicine ?[facility] ? ?Adjunct Instructor of Family Medicine  ?[employer] ?

## 2022-04-25 NOTE — Assessment & Plan Note (Addendum)
This is a very pleasant 56 year old female, she has bilateral knee pain and osteoarthritis, we treated her in December 2022 with a left knee injection, it is doing okay, she had a right knee injection at the last visit, unfortunately still has persistent swelling and pain, mostly medial joint line. ?Meloxicam and Tylenol together seem to control her pain moderately. ?Considering persistence of pain in spite of an injection, as well as greater than 6 weeks of physician directed conservative treatment I will go ahead and get her an MRI. ?If she has significant meniscal tearing we will get her arthroscopy, if not we will get her approved for viscosupplementation. ? ?Update: Probably does have significant meniscal tearing as well as some osteoarthritis, I would like Dr. Luiz Blare to weigh in regarding arthroscopy versus arthroplasty. ?

## 2022-04-29 ENCOUNTER — Ambulatory Visit (INDEPENDENT_AMBULATORY_CARE_PROVIDER_SITE_OTHER): Payer: 59

## 2022-04-29 DIAGNOSIS — G8929 Other chronic pain: Secondary | ICD-10-CM | POA: Diagnosis not present

## 2022-04-29 DIAGNOSIS — M25561 Pain in right knee: Secondary | ICD-10-CM | POA: Diagnosis not present

## 2022-04-29 DIAGNOSIS — M170 Bilateral primary osteoarthritis of knee: Secondary | ICD-10-CM

## 2022-05-02 ENCOUNTER — Other Ambulatory Visit: Payer: Self-pay | Admitting: Physician Assistant

## 2022-05-02 DIAGNOSIS — K2100 Gastro-esophageal reflux disease with esophagitis, without bleeding: Secondary | ICD-10-CM

## 2022-05-03 NOTE — Addendum Note (Signed)
Addended by: Monica Becton on: 05/03/2022 11:55 AM ? ? Modules accepted: Orders ? ?

## 2022-05-25 ENCOUNTER — Other Ambulatory Visit: Payer: Self-pay | Admitting: Physician Assistant

## 2022-05-25 NOTE — Telephone Encounter (Signed)
Not prescribed in two years. Please advise.

## 2022-05-26 ENCOUNTER — Telehealth: Payer: Self-pay | Admitting: Emergency Medicine

## 2022-05-26 ENCOUNTER — Emergency Department
Admission: EM | Admit: 2022-05-26 | Discharge: 2022-05-26 | Disposition: A | Discharge status: Home / Self Care | Payer: 59 | Source: Home / Self Care

## 2022-05-26 ENCOUNTER — Encounter: Payer: Self-pay | Admitting: Emergency Medicine

## 2022-05-26 DIAGNOSIS — J069 Acute upper respiratory infection, unspecified: Secondary | ICD-10-CM | POA: Diagnosis not present

## 2022-05-26 DIAGNOSIS — J4521 Mild intermittent asthma with (acute) exacerbation: Secondary | ICD-10-CM | POA: Diagnosis not present

## 2022-05-26 DIAGNOSIS — J302 Other seasonal allergic rhinitis: Secondary | ICD-10-CM

## 2022-05-26 MED ORDER — GUAIFENESIN ER 600 MG PO TB12
600.0000 mg | ORAL_TABLET | Freq: Two times a day (BID) | ORAL | 0 refills | Status: DC | PRN
Start: 2022-05-26 — End: 2022-07-03

## 2022-05-26 MED ORDER — KARBINAL ER 4 MG/5ML PO SUER: 10.0000 mL | Freq: Two times a day (BID) | ORAL | 0 refills | Status: DC

## 2022-05-26 MED ORDER — MONTELUKAST SODIUM 10 MG PO TABS
10.0000 mg | ORAL_TABLET | Freq: Every day | ORAL | 0 refills | Status: DC
Start: 2022-05-26 — End: 2022-09-13

## 2022-05-26 MED ORDER — FLUTICASONE PROPIONATE 50 MCG/ACT NA SUSP: 1.0000 | Freq: Two times a day (BID) | NASAL | 0 refills | Status: DC

## 2022-05-26 MED ORDER — ALBUTEROL SULFATE HFA 108 (90 BASE) MCG/ACT IN AERS
1.0000 | INHALATION_SPRAY | Freq: Four times a day (QID) | RESPIRATORY_TRACT | 0 refills | Status: DC | PRN
Start: 2022-05-26 — End: 2024-09-09

## 2022-05-26 MED ORDER — KARBINAL ER 4 MG/5ML PO SUER
10.0000 mL | Freq: Two times a day (BID) | ORAL | 0 refills | Status: DC
Start: 2022-05-26 — End: 2022-11-21

## 2022-05-26 MED ORDER — ALBUTEROL SULFATE (2.5 MG/3ML) 0.083% IN NEBU
2.5000 mg | INHALATION_SOLUTION | Freq: Once | RESPIRATORY_TRACT | Status: AC
  Administered 2022-05-26: 2.5 mg via RESPIRATORY_TRACT

## 2022-05-26 NOTE — Discharge Instructions (Addendum)
You appear to be suffering from reactive airway, which is like an allergy that affects the lungs. You were given an albuterol treatment in our office. Please continue with your home inhaler AS NEEDED (at most 2 puffs every 6 hours) Please re-start your normal Karbinal suspension. However, you should be taking 15mL twice daily at a minimum.  Start taking singulair tablet at night time. Mucinex twice daily with increased water to help break up the phlgem. Use flonase one spray twice daily to your nostrils. Stop if irritation or bleeding occurs.

## 2022-05-26 NOTE — Telephone Encounter (Signed)
Call from [facility] regarding prescriptions. Current [facility] ([facility] does not have Karbinal ER & it will not be in until Monday. Carolan Clines has confirmed that [facility] in [place] does have the medication. Provider updated  & will send medication to [facility]. No other questions at this time.

## 2022-05-26 NOTE — Telephone Encounter (Signed)
[facility] to [facility] ER prescription. Per pt & pharmacist, medication will not be available until Monday - see phone note from provider, new script sent in to [facility] in [place]

## 2022-05-26 NOTE — ED Provider Notes (Signed)
[facility]    CSN: [id number] Arrival date & time: 05/26/22  0837      History   Chief Complaint Chief Complaint  Patient presents with   Cough    HPI Joanne Winters is a 56 y.o. female.   56yo female with known hx of allergies presents today with a 10 day onset of sinus congestion, rhinorrhea, and dry cough with new onset 2-3 days ago of tightness in the chest. Works at [employer] and developed a respiratory condition due to the powders used at the plant. Follows with an allergist. Usually uses liquid carbinoxamine for allergy suppression, but ran out 3 days ago. Got a script of cetirizine tabs called in, but pt did not fill or start taking it as she does better with liquid. Sx have worsened since running out of her liquid antihistamine. Has an inhaler, but has not used for current episode. Denies fever, Covid19 exposures, rash, CP. Feels tight in chest. Cough is non-productive. Has been using OTC cough/cold medicine without relief. Kassie Mends out of town this evening. Feels cough is worse at night, due to post nasal drip and rhinorrhea.  The history is provided by the patient. No language interpreter was used.  Cough Onset quality:  Gradual Duration:  10 days Timing:  Intermittent Progression:  Waxing and waning Chronicity:  New Context: exposure to allergens, fumes, occupational exposure and upper respiratory infection   Relieved by:  Nothing Worsened by:  Deep breathing and environmental changes Ineffective treatments:  Decongestant and cough suppressants Associated symptoms: ear fullness, headaches ("sinus"), rhinorrhea, shortness of breath (tightness in chest, substernal discomfort) and sinus congestion   Associated symptoms: no chest pain, no chills, no diaphoresis, no ear pain, no eye discharge, no fever, no myalgias, no rash, no sore throat and no weight loss     Past Medical History:  Diagnosis Date   Essential hypertension 12/06/2015   GERD (gastroesophageal  reflux disease)    Menorrhagia    Morbid obesity (HCC) 12/22/2014   Seasonal allergies     Patient Active Problem List   Diagnosis Date Noted   History of anaphylaxis 07/01/2021   Bilateral leg cramps 06/21/2021   Gastroesophageal reflux disease with esophagitis 06/21/2021   Menorrhagia 06/30/2019   Chest discomfort 06/30/2019   Family history of colon cancer 05/07/2019   Tendinopathy of right rotator cuff 10/22/2018   Vaginal discharge 10/21/2018   Burning with urination 10/21/2018   Arthritis of right acromioclavicular joint 09/16/2018   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   Chronic pain of left knee 08/30/2018   Closed nondisplaced fracture of base of neck of left femur (HCC) 08/30/2018   Fall 08/25/2018   Acute pain of right shoulder 08/25/2018   DVT (deep venous thrombosis) (HCC) 08/16/2018   Primary osteoarthritis of both knees 06/05/2018   DDD (degenerative disc disease), lumbar 05/27/2018   Cough 02/13/2018   Plantar fasciitis, left 09/11/2017   Hoarseness or changing voice 09/11/2017   Left knee pain 07/11/2017   Multiple thyroid nodules 02/28/2017   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   Neuropathy 10/20/2016   De Quervain's tenosynovitis, right 10/20/2016   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   Motion sickness 07/16/2016   Skin tag 07/16/2016   PUD (peptic ulcer disease) 12/15/2015   Essential hypertension 12/06/2015   Plantar wart 05/12/2015   Thyroid goiter 12/22/2014   GERD (gastroesophageal reflux disease) 10/07/2014   Seasonal allergies 10/07/2014   Environmental allergies 10/07/2014   Class 2 obesity due to excess calories without serious  comorbidity with body mass index (BMI) of 38.0 to 38.9 in adult 10/07/2014    Past Surgical History:  Procedure Laterality Date   ABLATION  2013   CESAREAN SECTION     gall stone  1995    OB History     Gravida  3   Para  2   Term  2   Preterm      AB  1   Living         SAB  0   IAB  1    Ectopic      Multiple      Live Births  2            Home Medications    Prior to Admission medications   Medication Sig Start Date End Date Taking? Authorizing Provider  albuterol (VENTOLIN HFA) 108 (90 Base) MCG/ACT inhaler Inhale 1-2 puffs into the lungs every 6 (six) hours as needed for wheezing or shortness of breath. 05/26/22  Yes Xzayvion Vaeth L, PA  ALPRAZolam (XANAX) 0.5 MG tablet Take 1-2 tablets as needed for flying and travel. 03/15/22  Yes Agapito GamesMetheney, Catherine D, MD  fluticasone (FLONASE) 50 MCG/ACT nasal spray Place 1 spray into both nostrils in the morning and at bedtime. 05/26/22  Yes Arriyanna Mersch L, PA  guaiFENesin (MUCINEX) 600 MG 12 hr tablet Take 1 tablet (600 mg total) by mouth 2 (two) times daily as needed for to loosen phlegm or cough. 05/26/22  Yes Shellia Hartl L, PA  montelukast (SINGULAIR) 10 MG tablet Take 1 tablet (10 mg total) by mouth at bedtime. 05/26/22  Yes Enyla Lisbon L, PA  EPINEPHrine 0.3 mg/0.3 mL IJ SOAJ injection Inject 0.3 mg into the muscle as needed for anaphylaxis. 07/01/21   Breeback, Jade L, PA-C  hydrochlorothiazide (HYDRODIURIL) 25 MG tablet TAKE 1 TABLET BY MOUTH  DAILY. 06/17/21   Jomarie LongsBreeback, Jade L, PA-C  KARBINAL ER 4 MG/5ML SUER Take 10 mLs by mouth in the morning and at bedtime. 05/26/22   Laurence Crofford L, PA  meloxicam (MOBIC) 15 MG tablet Take 1 tablet (15 mg total) by mouth daily. 03/28/22   Monica Bectonhekkekandam, Thomas J, MD  pantoprazole (PROTONIX) 40 MG tablet TAKE 1 TABLET BY MOUTH  TWICE DAILY 05/02/22   Breeback, Jade L, PA-C  phentermine (ADIPEX-P) 37.5 MG tablet Take 37.5 mg by mouth daily. Patient not taking: Reported on 05/26/2022 12/28/21   [provider]    Family History Family History  Problem Relation Age of Onset   Hypertension Mother    Hypertension Father    Cancer - Colon Father    Hypertension Daughter    Cancer Maternal Grandmother        breast   Hypertension Maternal Grandmother    Cancer Paternal Grandmother         breast   Hypertension Paternal Grandmother     Social History Social History   Tobacco Use   Smoking status: Never   Smokeless tobacco: Never  Vaping Use   Vaping Use: Never used  Substance Use Topics   Alcohol use: Yes    Alcohol/week: 0.0 standard drinks of alcohol    Comment: occ   Drug use: No     Allergies   Hydrocodone, Tetanus toxoid, and Tramadol   Review of Systems Review of Systems  Constitutional:  Negative for chills, diaphoresis, fever and weight loss.  HENT:  Positive for rhinorrhea. Negative for ear pain and sore throat.   Eyes:  Negative for  discharge.  Respiratory:  Positive for cough and shortness of breath (tightness in chest, substernal discomfort).   Cardiovascular:  Negative for chest pain.  Musculoskeletal:  Negative for myalgias.  Skin:  Negative for rash.  Neurological:  Positive for headaches ("sinus").     Physical Exam Triage Vital Signs ED Triage Vitals  Enc Vitals Group     BP 05/26/22 0851 (!) 142/87     Pulse Rate 05/26/22 0851 73     Resp 05/26/22 0851 17     Temp 05/26/22 0851 98.8 F (37.1 C)     Temp Source 05/26/22 0851 Oral     SpO2 05/26/22 0851 100 %     Weight 05/26/22 0853 213 lb (96.6 kg)     Height 05/26/22 0853  (1.651 m)     Head Circumference --      Peak Flow --      Pain Score 05/26/22 0852 0     Pain Loc --      Pain Edu? --      Excl. in GC? --    No data found.  Updated Vital Signs BP (!) 142/87 (BP Location: Left Arm)   Pulse 73   Temp 98.8 F (37.1 C) (Oral)   Resp 17   Ht  (1.651 m)   Wt 213 lb (96.6 kg)   SpO2 100%   BMI 35.45 kg/m   Visual Acuity Right Eye Distance:   Left Eye Distance:   Bilateral Distance:    Right Eye Near:   Left Eye Near:    Bilateral Near:     Physical Exam Vitals and nursing note reviewed.  Constitutional:      General: She is not in acute distress.    Appearance: Normal appearance. She is well-developed. She is not ill-appearing,  toxic-appearing or diaphoretic.  HENT:     Head: Normocephalic and atraumatic.     Right Ear: Tympanic membrane, ear canal and external ear normal. There is no impacted cerumen.     Left Ear: Tympanic membrane, ear canal and external ear normal. There is no impacted cerumen.     Nose: Congestion and rhinorrhea present.     Comments: Significant turbinate hypertrophy bilaterally with clear rhinorrhea    Mouth/Throat:     Mouth: Mucous membranes are moist.     Pharynx: Oropharynx is clear. No oropharyngeal exudate or posterior oropharyngeal erythema.  Eyes:     General: No scleral icterus.       Right eye: No discharge.        Left eye: No discharge.     Extraocular Movements: Extraocular movements intact.     Conjunctiva/sclera: Conjunctivae normal.     Pupils: Pupils are equal, round, and reactive to light.  Cardiovascular:     Rate and Rhythm: Normal rate and regular rhythm.     Heart sounds: No murmur heard. Pulmonary:     Effort: Pulmonary effort is normal. No accessory muscle usage, respiratory distress or retractions.     Breath sounds: Normal air entry. No stridor, decreased air movement or transmitted upper airway sounds. No decreased breath sounds, wheezing, rhonchi or rales.     Comments: Decreased breath sounds Chest:     Chest wall: No tenderness.  Abdominal:     Palpations: Abdomen is soft.     Tenderness: There is no abdominal tenderness.  Musculoskeletal:        General: No swelling.     Cervical back: Normal range of motion and  neck supple. No rigidity or tenderness.  Lymphadenopathy:     Cervical: No cervical adenopathy.  Skin:    General: Skin is warm and dry.     Capillary Refill: Capillary refill takes less than 2 seconds.  Neurological:     Mental Status: She is alert.  Psychiatric:        Mood and Affect: Mood normal.      UC Treatments / Results  Labs (all labs ordered are listed, but only abnormal results are displayed) Labs Reviewed - No data to  display  EKG   Radiology No results found.  Procedures Procedures (including critical care time)  Medications Ordered in UC Medications  albuterol (PROVENTIL) (2.5 MG/3ML) 0.083% nebulizer solution 2.5 mg (2.5 mg Nebulization Given 05/26/22 0908)    Initial Impression / Assessment and Plan / UC Course  I have reviewed the triage vital signs and the nursing notes.  Pertinent labs & imaging results that were available during my care of the patient were reviewed by me and considered in my medical decision making (see chart for details).     Viral URI - supportive measures discussed. Stop your OTC meds, start plain mucinex BID, flonase BID.  RAD - restart your typical karbinal, only take 10-68mL BID which is the standard dose. Start montelukast at night, inhaler prn. 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 You were given an albuterol treatment in our office. Please continue with your home inhaler AS NEEDED (at most 2 puffs every 6 hours) Please re-start your normal Karbinal suspension. However, you should be taking 19mL twice daily at a minimum.  Start taking singulair tablet at night time. Mucinex twice daily with increased water to help break up the phlgem. Use flonase one spray twice daily to your nostrils. Stop if irritation or bleeding occurs.      ED Prescriptions     Medication Sig Dispense Auth. Provider   [facility] ER 4 MG/5ML SUER Take 10 mLs by mouth in the morning and at bedtime. 480 mL Mylea Roarty L, PA   montelukast (SINGULAIR) 10 MG tablet Take 1 tablet (10 mg total) by mouth at bedtime. 30 tablet Kiala Faraj L, PA   guaiFENesin (MUCINEX) 600 MG 12 hr  tablet Take 1 tablet (600 mg total) by mouth 2 (two) times daily as needed for to loosen phlegm or cough. 20 tablet Kamiya Acord L, PA   fluticasone (FLONASE) 50 MCG/ACT nasal spray Place 1 spray into both nostrils in the morning and at bedtime. 16 mL Nya Monds L, PA   albuterol (VENTOLIN HFA) 108 (90 Base) MCG/ACT inhaler Inhale 1-2 puffs into the lungs every 6 (six) hours as needed for wheezing or shortness of breath. 8 g Glendy Barsanti L, [place]      PDMP not reviewed this encounter.   Maretta Bees, [place] 05/26/22 [phone]

## 2022-05-26 NOTE — Telephone Encounter (Signed)
Pt had to have Rx changed to different pharmacy as it was [facility] of stock at [facility]. New Rx called in to [facility]

## 2022-05-26 NOTE — ED Triage Notes (Signed)
Cough x 10 days OTC cough medicine  Cough is worse at night  Denies fever Unable to see PCP

## 2022-05-29 ENCOUNTER — Emergency Department (INDEPENDENT_AMBULATORY_CARE_PROVIDER_SITE_OTHER)
Admission: EM | Admit: 2022-05-29 | Discharge: 2022-05-29 | Disposition: A | Discharge status: Home / Self Care | Payer: Self-pay | Source: Home / Self Care

## 2022-05-29 DIAGNOSIS — S8002XA Contusion of left knee, initial encounter: Secondary | ICD-10-CM

## 2022-05-29 DIAGNOSIS — S8990XA Unspecified injury of unspecified lower leg, initial encounter: Secondary | ICD-10-CM

## 2022-05-29 MED ORDER — METAXALONE 800 MG PO TABS: 800.0000 mg | ORAL_TABLET | Freq: Three times a day (TID) | ORAL | 0 refills | Status: DC
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
## 2022-05-29 NOTE — Discharge Instructions (Signed)
You likely have a contusion (bone bruise) and LCL strain. Please ice the knee several times daily. You can continue use of your mobic. Do not take any additional OTC NSAIDs (advil, motrin, aleve), but you can take acetaminophen (Tylenol) as needed. Max tylenol dose is 1000mg  three times daily. Wear an ACE wrap or neoprene knee sleeve. Start taking metaxolone as needed. This medication may make you drowsy.
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
## 2022-06-12 ENCOUNTER — Encounter: Payer: Self-pay | Admitting: Physician Assistant

## 2022-06-13 ENCOUNTER — Emergency Department (INDEPENDENT_AMBULATORY_CARE_PROVIDER_SITE_OTHER): Payer: 59

## 2022-06-13 ENCOUNTER — Emergency Department
Admission: EM | Admit: 2022-06-13 | Discharge: 2022-06-13 | Disposition: A | Discharge status: Home / Self Care | Payer: 59 | Source: Home / Self Care

## 2022-06-13 DIAGNOSIS — M25562 Pain in left knee: Secondary | ICD-10-CM

## 2022-06-13 MED ORDER — CYCLOBENZAPRINE HCL 10 MG PO TABS: 10.0000 mg | ORAL_TABLET | Freq: Two times a day (BID) | ORAL | 0 refills | Status: AC | PRN

## 2022-06-13 MED ORDER — CELECOXIB 100 MG PO CAPS: 100.0000 mg | ORAL_CAPSULE | Freq: Two times a day (BID) | ORAL | 0 refills | Status: DC
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
## 2022-06-14 ENCOUNTER — Ambulatory Visit: Payer: 59 | Admitting: Sports Medicine

## 2022-06-15 ENCOUNTER — Ambulatory Visit: Payer: 59 | Admitting: Family Medicine

## 2022-06-15 ENCOUNTER — Encounter: Payer: Self-pay | Admitting: Family Medicine

## 2022-06-15 VITALS — BP 130/86 | Ht 65.0 in | Wt 213.0 lb

## 2022-06-15 DIAGNOSIS — M1712 Unilateral primary osteoarthritis, left knee: Secondary | ICD-10-CM | POA: Diagnosis not present
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
## 2022-06-15 NOTE — Patient Instructions (Signed)
Nice to meet you Please use ice as needed  Please try the exercises. You can take the Celebrex until the pain ceases and then use it as needed. Please send me a message in MyChart with any questions or updates.  Please see me back or Dr. Karie Schwalbe in 4 weeks.   --Dr. Jordan Likes

## 2022-06-30 ENCOUNTER — Encounter: Payer: Self-pay | Admitting: Family Medicine

## 2022-07-04 ENCOUNTER — Ambulatory Visit (INDEPENDENT_AMBULATORY_CARE_PROVIDER_SITE_OTHER): Payer: 59 | Admitting: Physician Assistant

## 2022-07-04 DIAGNOSIS — M1712 Unilateral primary osteoarthritis, left knee: Secondary | ICD-10-CM | POA: Diagnosis not present

## 2022-07-04 MED ORDER — CELECOXIB 100 MG PO CAPS: 100.0000 mg | ORAL_CAPSULE | Freq: Two times a day (BID) | ORAL | 2 refills | Status: DC

## 2022-07-04 NOTE — Patient Instructions (Signed)
Follow up with Dr. Luiz Blare orthopedist.
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
## 2022-07-07 ENCOUNTER — Encounter: Payer: Self-pay | Admitting: Physician Assistant

## 2022-07-20 NOTE — Telephone Encounter (Signed)
Sent in error

## 2022-08-14 ENCOUNTER — Ambulatory Visit: Payer: 59 | Admitting: Sports Medicine

## 2022-08-14 ENCOUNTER — Telehealth: Payer: Self-pay | Admitting: Sports Medicine

## 2022-08-14 DIAGNOSIS — M170 Bilateral primary osteoarthritis of knee: Secondary | ICD-10-CM

## 2022-08-14 MED ORDER — IBUPROFEN 800 MG PO TABS: 800.0000 mg | ORAL_TABLET | Freq: Three times a day (TID) | ORAL | 2 refills | Status: DC | PRN

## 2022-08-14 NOTE — Progress Notes (Signed)
    Procedures performed today:    None.  Independent interpretation of notes and tests performed by another provider:   None.  Brief History, Exam, Impression, and Recommendations:    Primary osteoarthritis of both knees Very pleasant 56 year old female, bilateral knee pain with osteoarthritis, she has persistent pain, left knee was recently injected by her PCP, overall doing okay but still has significant discomfort, she did not find Celebrex effective, ibuprofen seems to help so we will go to prescription strength ibuprofen 3 times daily, home conditioning, I would also like to get her approved for viscosupplementation. Of note she is post right knee arthroscopy with Dr. Luiz Blare. Return to see me in 6 weeks.  Chronic process with exacerbation and pharmacologic intervention  ____________________________________________ Ihor Austin. Benjamin Stain, M.D., ABFM., CAQSM., AME. Primary Care and Sports Medicine [facility]  Adjunct Professor of Family Medicine  [place]  Restaurant manager, fast food

## 2022-08-14 NOTE — Assessment & Plan Note (Signed)
Very pleasant 56 year old female, bilateral knee pain with osteoarthritis, she has persistent pain, left knee was recently injected by her PCP, overall doing okay but still has significant discomfort, she did not find Celebrex effective, ibuprofen seems to help so we will go to prescription strength ibuprofen 3 times daily, home conditioning, I would also like to get her approved for viscosupplementation. Of note she is post right knee arthroscopy with Dr. Luiz Blare. Return to see me in 6 weeks.

## 2022-08-14 NOTE — Telephone Encounter (Signed)
Please work on [employer], bilateral x-ray confirmed osteoarthritis, failed steroid injections and greater than 6 weeks of physician directed home physical therapy.

## 2022-08-23 NOTE — Telephone Encounter (Signed)
[employer] paperwork faxed to [employer] at [phone] Request is for Orthovisc Pt's insurance prefers Orthovisc Fax confirmation receipt received  

## 2022-09-07 NOTE — Telephone Encounter (Signed)
Benefits Investigation Details received from [employer] Injection: Euflexxa  Medical: Deductible does apply. Since the deductible has been met pt is responsible for a 10% coinsurance. Once the OOP is met, pt is covered at 100%. PA required: Yes PA obtained through [employer] provider portal: [id number] good from 09/07/22 through 03/08/23  Pharmacy: Product not covered under pharmacy plan.   Specialty Pharmacy: [facility]  May fill through: Buy and Bill OR Specialty Pharmacy OV Copay/Coinsurance:  Product Copay: 10% ($140) Administration Coinsurance:  Administration Copay: $20 Deductible: $500 (met: $500) Out of Pocket Max: $3500 (met: $1059.04)    Patient's approx cost per injection is $160. Patient aware of the cost and wishes to proceed with the injections.   Please order 2 boxes of Euflexxa as this patient is for bilateral injections.  

## 2022-09-08 NOTE — Telephone Encounter (Signed)
Task completed. Order shipment placed for 2 boxes.

## 2022-09-11 NOTE — Telephone Encounter (Signed)
Medication was rec'd today. Please call patient to schedule 3 once weekly injections for both knees.

## 2022-09-11 NOTE — Telephone Encounter (Signed)
LVM for pt to call and schedule  °

## 2022-09-13 ENCOUNTER — Ambulatory Visit: Payer: 59 | Admitting: Physician Assistant

## 2022-09-13 ENCOUNTER — Encounter: Payer: Self-pay | Admitting: Physician Assistant

## 2022-09-13 DIAGNOSIS — M549 Dorsalgia, unspecified: Secondary | ICD-10-CM | POA: Insufficient documentation

## 2022-09-13 DIAGNOSIS — M542 Cervicalgia: Secondary | ICD-10-CM | POA: Diagnosis not present

## 2022-09-13 MED ORDER — KETOROLAC TROMETHAMINE 60 MG/2ML IM SOLN
60.0000 mg | Freq: Once | INTRAMUSCULAR | Status: AC
  Administered 2022-09-13: 60 mg via INTRAMUSCULAR

## 2022-09-13 MED ORDER — CYCLOBENZAPRINE HCL 10 MG PO TABS: 5.0000 mg | ORAL_TABLET | Freq: Three times a day (TID) | ORAL | 0 refills | Status: DC | PRN
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
## 2022-09-13 NOTE — Patient Instructions (Addendum)
Neck Exercises Ask your health care provider which exercises are safe for you. Do exercises exactly as told by your health care provider and adjust them as directed. It is normal to feel mild stretching, pulling, tightness, or discomfort as you do these exercises. Stop right away if you feel sudden pain or your pain gets worse. Do not begin these exercises until told by your health care provider. Neck exercises can be important for many reasons. They can improve strength and maintain flexibility in your neck, which will help your upper back and prevent neck pain. Stretching exercises Rotation neck stretching  Sit in a chair or stand up. Place your feet flat on the floor, shoulder-width apart. Slowly turn your head (rotate) to the right until a slight stretch is felt. Turn it all the way to the right so you can look over your right shoulder. Do not tilt or tip your head. Hold this position for 10-30 seconds. Slowly turn your head (rotate) to the left until a slight stretch is felt. Turn it all the way to the left so you can look over your left shoulder. Do not tilt or tip your head. Hold this position for 10-30 seconds. Repeat __________ times. Complete this exercise __________ times a day. Neck retraction  Sit in a sturdy chair or stand up. Look straight ahead. Do not bend your neck. Use your fingers to push your chin backward (retraction). Do not bend your neck for this movement. Continue to face straight ahead. If you are doing the exercise properly, you will feel a slight sensation in your throat and a stretch at the back of your neck. Hold the stretch for 1-2 seconds. Repeat __________ times. Complete this exercise __________ times a day. Strengthening exercises Neck press  Lie on your back on a firm bed or on the floor with a pillow under your head. Use your neck muscles to push your head down on the pillow and straighten your spine. Hold the position as well as you can. Keep your head  facing up (in a neutral position) and your chin tucked. Slowly count to 5 while holding this position. Repeat __________ times. Complete this exercise __________ times a day. Isometrics These are exercises in which you strengthen the muscles in your neck while keeping your neck still (isometrics). Sit in a supportive chair and place your hand on your forehead. Keep your head and face facing straight ahead. Do not flex or extend your neck while doing isometrics. Push forward with your head and neck while pushing back with your hand. Hold for 10 seconds. Do the sequence again, this time putting your hand against the back of your head. Use your head and neck to push backward against the hand pressure. Finally, do the same exercise on either side of your head, pushing sideways against the pressure of your hand. Repeat __________ times. Complete this exercise __________ times a day. Prone head lifts  Lie face-down (prone position), resting on your elbows so that your chest and upper back are raised. Start with your head facing downward, near your chest. Position your chin either on or near your chest. Slowly lift your head upward. Lift until you are looking straight ahead. Then continue lifting your head as far back as you can comfortably stretch. Hold your head up for 5 seconds. Then slowly lower it to your starting position. Repeat __________ times. Complete this exercise __________ times a day. Supine head lifts  Lie on your back (supine position), bending your knees   to point to the ceiling and keeping your feet flat on the floor. Lift your head slowly off the floor, raising your chin toward your chest. Hold for 5 seconds. Repeat __________ times. Complete this exercise __________ times a day. Scapular retraction  Stand with your arms at your sides. Look straight ahead. Slowly pull both shoulders (scapulae) backward and downward (retraction) until you feel a stretch between your shoulder  blades in your upper back. Hold for 10-30 seconds. Relax and repeat. Repeat __________ times. Complete this exercise __________ times a day. Contact a health care provider if: Your neck pain or discomfort gets worse when you do an exercise. Your neck pain or discomfort does not improve within 2 hours after you exercise. If you have any of these problems, stop exercising right away. Do not do the exercises again unless your health care provider says that you can. Get help right away if: You develop sudden, severe neck pain. If this happens, stop exercising right away. Do not do the exercises again unless your health care provider says that you can. This information is not intended to replace advice given to you by your health care provider. Make sure you discuss any questions you have with your health care provider. Document Revised: 05/31/2021 Document Reviewed: 05/31/2021 Elsevier Patient Education  2023 Elsevier Inc.  

## 2022-09-14 NOTE — Telephone Encounter (Signed)
Patient scheduled for 09/25/22 for first injection.

## 2022-09-25 ENCOUNTER — Ambulatory Visit: Payer: 59 | Admitting: Sports Medicine

## 2022-09-25 ENCOUNTER — Ambulatory Visit (INDEPENDENT_AMBULATORY_CARE_PROVIDER_SITE_OTHER): Payer: 59

## 2022-09-25 DIAGNOSIS — M170 Bilateral primary osteoarthritis of knee: Secondary | ICD-10-CM

## 2022-09-25 NOTE — Assessment & Plan Note (Signed)
Pleasant 56 year old female, persistent knee pain, today we did Euflexxa No. 1 of 3 both knees, return in 1 week for #2 of 3 both knees. She is post right knee arthroscopy with Dr. Berenice Primas.

## 2022-09-25 NOTE — Progress Notes (Signed)
    Procedures performed today:    Procedure: Real-time Ultrasound Guided aspiration/injection of the left knee Device: Samsung HS60  Verbal informed consent obtained.  Time-out conducted.  Noted no overlying erythema, induration, or other signs of local infection.  Skin prepped in a sterile fashion.  Local anesthesia: Topical Ethyl chloride.  With sterile technique and under real time ultrasound guidance: Noted moderate effusion, I advanced an 18-gauge needle into the suprapatellar recess, aspirated 15 mL of clear, straw-colored fluid, syringe switched and 1 syringe of Euflexxa injected into the suprapatellar recess. Advised to call if fevers/chills, erythema, induration, drainage, or persistent bleeding.  Images permanently stored and available for review in PACS.  Impression: Technically successful ultrasound guided aspiration/injection.  Procedure: Real-time Ultrasound Guided aspiration/injection of the right knee Device: Samsung HS60  Verbal informed consent obtained.  Time-out conducted.  Noted no overlying erythema, induration, or other signs of local infection.  Skin prepped in a sterile fashion.  Local anesthesia: Topical Ethyl chloride.  With sterile technique and under real time ultrasound guidance: Noted moderate effusion, I advanced an 18-gauge needle into the suprapatellar recess, aspirated 30 mL of clear, straw-colored fluid, syringe switched and 1 syringe of Euflexxa injected into the suprapatellar recess. Advised to call if fevers/chills, erythema, induration, drainage, or persistent bleeding.  Images permanently stored and available for review in PACS.  Impression: Technically successful ultrasound guided aspiration/injection.  Independent interpretation of notes and tests performed by another provider:   None.  Brief History, Exam, Impression, and Recommendations:    Primary osteoarthritis of both knees Pleasant 56 year old female, persistent knee pain, today we  did Euflexxa No. 1 of 3 both knees, return in 1 week for #2 of 3 both knees. She is post right knee arthroscopy with Dr. Berenice Primas.    ____________________________________________ Gwen Her. Dianah Field, M.D., ABFM., CAQSM., AME. Primary Care and Sports Medicine [place] [facility]  Adjunct Professor of [place] of [facility] of Medicine  Risk manager

## 2022-10-03 ENCOUNTER — Ambulatory Visit: Payer: 59 | Admitting: Sports Medicine

## 2022-10-03 ENCOUNTER — Ambulatory Visit (INDEPENDENT_AMBULATORY_CARE_PROVIDER_SITE_OTHER): Payer: 59

## 2022-10-03 DIAGNOSIS — M170 Bilateral primary osteoarthritis of knee: Secondary | ICD-10-CM | POA: Diagnosis not present

## 2022-10-03 NOTE — Progress Notes (Signed)
    Procedures performed today:    Procedure: Real-time Ultrasound Guided aspiration/injection of the left knee Device: Samsung HS60  Verbal informed consent obtained.  Time-out conducted.  Noted no overlying erythema, induration, or other signs of local infection.  Skin prepped in a sterile fashion.  Local anesthesia: Topical Ethyl chloride.  With sterile technique and under real time ultrasound guidance: Noted mild effusion, 1 syringe of Euflexxa injected into the suprapatellar recess with a 22-gauge needle. Advised to call if fevers/chills, erythema, induration, drainage, or persistent bleeding.  Images permanently stored and available for review in PACS.  Impression: Technically successful ultrasound guided aspiration/injection.   Procedure: Real-time Ultrasound Guided aspiration/injection of the right knee Device: Samsung HS60  Verbal informed consent obtained.  Time-out conducted.  Noted no overlying erythema, induration, or other signs of local infection.  Skin prepped in a sterile fashion.  Local anesthesia: Topical Ethyl chloride.  With sterile technique and under real time ultrasound guidance: Noted moderate effusion, I advanced an 18-gauge needle into the suprapatellar recess, aspirated 30 mL of clear, straw-colored fluid, syringe switched and 1 syringe of Euflexxa injected into the suprapatellar recess. Advised to call if fevers/chills, erythema, induration, drainage, or persistent bleeding.  Images permanently stored and available for review in PACS.  Impression: Technically successful ultrasound guided aspiration/injection.  Independent interpretation of notes and tests performed by another provider:   None.  Brief History, Exam, Impression, and Recommendations:    Primary osteoarthritis of both knees Euflexxa No. 2 of 3 both knees, return in 1 week for #3 of 3. Status post right knee arthroscopy with Dr.  Berenice Primas.    ____________________________________________ Gwen Her. Dianah Field, M.D., ABFM., CAQSM., AME. Primary Care and Sports Medicine [place] [facility]  Adjunct Professor of [place] of [facility] of Medicine  Risk manager

## 2022-10-03 NOTE — Assessment & Plan Note (Signed)
Euflexxa No. 2 of 3 both knees, return in 1 week for #3 of 3. Status post right knee arthroscopy with Dr. Berenice Primas.

## 2022-10-06 ENCOUNTER — Ambulatory Visit: Payer: 59 | Admitting: Physician Assistant

## 2022-10-10 ENCOUNTER — Ambulatory Visit: Payer: 59 | Admitting: Sports Medicine

## 2022-10-10 ENCOUNTER — Ambulatory Visit (INDEPENDENT_AMBULATORY_CARE_PROVIDER_SITE_OTHER): Payer: 59

## 2022-10-10 DIAGNOSIS — M170 Bilateral primary osteoarthritis of knee: Secondary | ICD-10-CM

## 2022-10-10 DIAGNOSIS — R200 Anesthesia of skin: Secondary | ICD-10-CM

## 2022-10-10 DIAGNOSIS — R202 Paresthesia of skin: Secondary | ICD-10-CM

## 2022-10-10 MED ORDER — PREDNISONE 50 MG PO TABS: ORAL_TABLET | ORAL | 0 refills | Status: DC

## 2022-10-10 NOTE — Assessment & Plan Note (Signed)
Several weeks of increasing numbness and tingling left dorsal lateral hand going towards the second and third digits. Negative Tinel's and Phalen signs, suspect cervical radiculitis. Adding 5 days of prednisone, neck x-rays and home physical therapy, return in 6 weeks, MRI for interventional planning if no better.

## 2022-10-10 NOTE — Assessment & Plan Note (Signed)
Euflexxa 3 of 3 both knees, return to see me as needed. Of note status post right knee arthroscopy with Dr. Berenice Primas.

## 2022-10-10 NOTE — Progress Notes (Signed)
    Procedures performed today:    Procedure: Real-time Ultrasound Guided aspiration/injection of the left knee Device: Samsung HS60  Verbal informed consent obtained.  Time-out conducted.  Noted no overlying erythema, induration, or other signs of local infection.  Skin prepped in a sterile fashion.  Local anesthesia: Topical Ethyl chloride.  With sterile technique and under real time ultrasound guidance: Noted mild effusion, 1 syringe of Euflexxa injected into the suprapatellar recess with a 22-gauge needle. Advised to call if fevers/chills, erythema, induration, drainage, or persistent bleeding.  Images permanently stored and available for review in PACS.  Impression: Technically successful ultrasound guided aspiration/injection.   Procedure: Real-time Ultrasound Guided aspiration/injection of the right knee Device: Samsung HS60  Verbal informed consent obtained.  Time-out conducted.  Noted no overlying erythema, induration, or other signs of local infection.  Skin prepped in a sterile fashion.  Local anesthesia: Topical Ethyl chloride.  With sterile technique and under real time ultrasound guidance: Noted moderate effusion, I advanced an 18-gauge needle into the suprapatellar recess, aspirated 30 mL of clear, straw-colored fluid, syringe switched and 1 syringe of Euflexxa injected into the suprapatellar recess. Advised to call if fevers/chills, erythema, induration, drainage, or persistent bleeding.  Images permanently stored and available for review in PACS.  Impression: Technically successful ultrasound guided aspiration/injection.  Independent interpretation of notes and tests performed by another provider:   None.  Brief History, Exam, Impression, and Recommendations:    Primary osteoarthritis of both knees Euflexxa 3 of 3 both knees, return to see me as needed. Of note status post right knee arthroscopy with Dr. Berenice Primas.  Numbness and tingling in left hand Several weeks  of increasing numbness and tingling left dorsal lateral hand going towards the second and third digits. Negative Tinel's and Phalen signs, suspect cervical radiculitis. Adding 5 days of prednisone, neck x-rays and home physical therapy, return in 6 weeks, MRI for interventional planning if no better.    ____________________________________________ Gwen Her. Dianah Field, M.D., ABFM., CAQSM., AME. Primary Care and Sports Medicine [place] [facility]  Adjunct Professor of [place] of [facility] of Medicine  Risk manager

## 2022-11-21 ENCOUNTER — Ambulatory Visit: Payer: 59 | Admitting: Sports Medicine

## 2022-11-21 DIAGNOSIS — R200 Anesthesia of skin: Secondary | ICD-10-CM | POA: Diagnosis not present

## 2022-11-21 DIAGNOSIS — R202 Paresthesia of skin: Secondary | ICD-10-CM | POA: Diagnosis not present

## 2022-11-21 DIAGNOSIS — M170 Bilateral primary osteoarthritis of knee: Secondary | ICD-10-CM | POA: Diagnosis not present

## 2022-11-21 MED ORDER — KARBINAL ER 4 MG/5ML PO SUER: 10.0000 mL | Freq: Two times a day (BID) | ORAL | 0 refills | Status: DC

## 2022-11-21 NOTE — Assessment & Plan Note (Signed)
We finished 3 shots of Euflexxa, she is doing really well, pain-free, return as needed. Of note status post right knee arthroscopy with Dr. Luiz Blare.

## 2022-11-21 NOTE — Assessment & Plan Note (Signed)
Numbness and tingling in the left dorsal hand much better. It was going to the second and third digits, i.e. a C7 distribution, she had a negative Tinel's and Phalen sign so we suspected cervical radiculitis, we did 5 days of prednisone, neck x-rays and home physical therapy and she is for the most part better. She will call and let me know if she would like to proceed with MRI at some point in the future.

## 2022-11-21 NOTE — Progress Notes (Signed)
    Procedures performed today:    None.  Independent interpretation of notes and tests performed by another provider:   None.  Brief History, Exam, Impression, and Recommendations:    Primary osteoarthritis of both knees We finished 3 shots of Euflexxa, she is doing really well, pain-free, return as needed. Of note status post right knee arthroscopy with Dr. Luiz Blare.  Numbness and tingling in left hand Numbness and tingling in the left dorsal hand much better. It was going to the second and third digits, i.e. a C7 distribution, she had a negative Tinel's and Phalen sign so we suspected cervical radiculitis, we did 5 days of prednisone, neck x-rays and home physical therapy and she is for the most part better. She will call and let me know if she would like to proceed with MRI at some point in the future.    ____________________________________________ Ihor Austin. Benjamin Stain, M.D., ABFM., CAQSM., AME. Primary Care and Sports Medicine [place] [facility]  Adjunct Professor of Family Medicine  [place] of [facility] of Medicine  Restaurant manager, fast food

## 2022-12-22 ENCOUNTER — Encounter: Payer: Self-pay | Admitting: Sports Medicine

## 2022-12-22 ENCOUNTER — Ambulatory Visit (INDEPENDENT_AMBULATORY_CARE_PROVIDER_SITE_OTHER): Payer: 59 | Admitting: Sports Medicine

## 2022-12-22 ENCOUNTER — Ambulatory Visit (INDEPENDENT_AMBULATORY_CARE_PROVIDER_SITE_OTHER): Payer: 59

## 2022-12-22 DIAGNOSIS — M7712 Lateral epicondylitis, left elbow: Secondary | ICD-10-CM

## 2022-12-22 DIAGNOSIS — M67874 Other specified disorders of tendon, left ankle and foot: Secondary | ICD-10-CM

## 2022-12-22 MED ORDER — MELOXICAM 15 MG PO TABS: ORAL_TABLET | ORAL | 3 refills | Status: DC

## 2022-12-22 NOTE — Assessment & Plan Note (Signed)
Several weeks increasing pain left posterior ankle at the Achilles insertion, no trauma. On exam she has tenderness at the insertion without a significantly swollen retrocalcaneal bursa Suspect insertional Achilles tendinosis, adding heel lifts, home physical therapy, meloxicam. Return to see me in 6 weeks, we will consider formal therapy if not better.

## 2022-12-22 NOTE — Progress Notes (Signed)
    Procedures performed today:    None.  Independent interpretation of notes and tests performed by another provider:   None.  Brief History, Exam, Impression, and Recommendations:    Achilles tendinosis of left ankle, insertional Several weeks increasing pain left posterior ankle at the Achilles insertion, no trauma. On exam she has tenderness at the insertion without a significantly swollen retrocalcaneal bursa Suspect insertional Achilles tendinosis, adding heel lifts, home physical therapy, meloxicam. Return to see me in 6 weeks, we will consider formal therapy if not better.  Lateral epicondylitis, left elbow Several weeks of exquisite pain left lateral elbow worse with extension of the wrist, classic tennis elbow, she will get an over-the-counter tennis elbow brace, adding meloxicam, home physical therapy, x-rays, return to see me in 6 weeks, injection if not better.    ____________________________________________ Gwen Her. Dianah Field, M.D., ABFM., CAQSM., AME. Primary Care and Sports Medicine [place] [facility]  Adjunct Professor of [place] of [facility] of Medicine  Risk manager

## 2022-12-22 NOTE — Assessment & Plan Note (Signed)
Several weeks of exquisite pain left lateral elbow worse with extension of the wrist, classic tennis elbow, she will get an over-the-counter tennis elbow brace, adding meloxicam, home physical therapy, x-rays, return to see me in 6 weeks, injection if not better.

## 2022-12-22 NOTE — Patient Instructions (Addendum)
Achilles rehab: Begin with easy walking, heel, toe and backwards  Do calf raises on a step:  First lower and then raise on 1 foot  If this is painful, lower on 1 foot, do the heel raise on both feet Begin with 3 sets of 10 repetitions  Increase by 5 repetitions every 3 days  Goal is 3 sets of 30 repetitions  Do with both straight knee and knee at 20 degrees of flexion  If pain persists, once you can do 3 sets of 30 without weight, add backpack with 5 lbs.  Increase by 5 lbs per week to max of 30 lbs for 3 sets of 15   

## 2023-02-02 ENCOUNTER — Ambulatory Visit: Payer: 59 | Admitting: Sports Medicine

## 2023-02-27 ENCOUNTER — Encounter: Payer: Self-pay | Admitting: Physician Assistant

## 2023-02-27 ENCOUNTER — Ambulatory Visit (INDEPENDENT_AMBULATORY_CARE_PROVIDER_SITE_OTHER): Payer: 59 | Admitting: Physician Assistant

## 2023-02-27 VITALS — BP 137/78 | HR 80 | Ht 65.0 in | Wt 217.0 lb

## 2023-02-27 DIAGNOSIS — M5030 Other cervical disc degeneration, unspecified cervical region: Secondary | ICD-10-CM

## 2023-02-27 DIAGNOSIS — M549 Dorsalgia, unspecified: Secondary | ICD-10-CM

## 2023-02-27 DIAGNOSIS — F40243 Fear of flying: Secondary | ICD-10-CM

## 2023-02-27 DIAGNOSIS — M542 Cervicalgia: Secondary | ICD-10-CM

## 2023-02-27 MED ORDER — CYCLOBENZAPRINE HCL 10 MG PO TABS: 5.0000 mg | ORAL_TABLET | Freq: Three times a day (TID) | ORAL | 0 refills | Status: DC | PRN

## 2023-02-27 MED ORDER — ALPRAZOLAM 0.5 MG PO TABS: ORAL_TABLET | ORAL | 0 refills | Status: DC
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
## 2023-02-27 NOTE — Patient Instructions (Addendum)
Continue mobic once a day Use flexeril as needed Ordered PT for [facility] needling [facility] Use tens unit with heating pad  Degenerative Disk Disease  Degenerative disk disease is a condition caused by changes that occur in the spinal disks as a person ages. Spinal disks are soft and compressible disks located between the bones of your spine (vertebrae). These disks act like shock absorbers. Degenerative disk disease can affect the whole spine. However, the neck and lower back are most often affected. Many changes can occur in the spinal disks with aging, such as: The spinal disks may dry and shrink. Small tears may occur in the tough, outer covering of the disk (annulus). The disk space may become smaller due to loss of water. Abnormal growths in the bone (spurs) may occur. This can put pressure on the nerve roots exiting the spinal canal, causing pain. The spinal canal may become narrowed. What are the causes? This condition may be caused by: Normal degeneration with age. Injuries. Certain activities and sports that cause damage. What increases the risk? The following factors may make you more likely to develop this condition: Being overweight. Having a family history of degenerative disk disease. Smoking and use of products that contain nicotine and tobacco. Sudden injury. Doing work that requires heavy lifting. What are the signs or symptoms? Symptoms of this condition include: Pain that varies in intensity. Some people have no pain, while others have severe pain. The location of the pain depends on the part of your backbone that is affected. You may have: Pain in your neck or arm if a disk in your neck area is affected. Pain in your back, buttocks, or legs if a disk in your lower back is affected. Pain that becomes worse while bending or reaching up, or with twisting movements. Pain that may start gradually and worsen as time passes. It may also start after a major or minor  injury. Numbness or tingling in the arms or legs. How is this diagnosed? This condition may be diagnosed based on: Your symptoms and medical history. A physical exam. Imaging tests, including: X-ray of the spine. CT scan. MRI. How is this treated? This condition may be treated with: Medicines. Injection of steroids into the back. Rehabilitation exercises. These activities aim to strengthen muscles in your back and abdomen to better support your spine. If treatments do not help to relieve your symptoms or you have severe pain, you may need surgery. Follow these instructions at home: Medicines Take over-the-counter and prescription medicines only as told by your health care provider. Ask your health care provider if the medicine prescribed to you: Requires you to avoid driving or using machinery. Can cause constipation. You may need to take these actions to prevent or treat constipation: Drink enough fluid to keep your urine pale yellow. Take over-the-counter or prescription medicines. Eat foods that are high in fiber, such as beans, whole grains, and fresh fruits and vegetables. Limit foods that are high in fat and processed sugars, such as fried or sweet foods. Activity Rest as told by your health care provider. Avoid sitting for a long time without moving. Get up to take short walks every 1-2 hours. This is important to improve blood flow and breathing. Ask for help if you feel weak or unsteady. Return to your normal activities as told by your health care provider. Ask your health care provider what activities are safe for you. Perform relaxation exercises as told by your health care provider.  Maintain good posture. Do not lift anything that is heavier than 10 lb (4.5 kg), or the limit that you are told, until your health care provider says that it is safe. Follow proper lifting and walking techniques as told by your health care provider. Managing pain, stiffness, and swelling      If directed, put ice on the painful area. Icing can help to relieve pain. To do this: Put ice in a plastic bag. Place a towel between your skin and the bag. Leave the ice on for 20 minutes, 2-3 times a day. Remove the ice if your skin turns bright red. This is very important. If you cannot feel pain, heat, or cold, you have a greater risk of damage to the area. If directed, apply heat to the painful area as often as told by your health care provider. Heat can reduce the stiffness of your muscles. Use the heat source that your health care provider recommends, such as a moist heat pack or a heating pad. Place a towel between your skin and the heat source. Leave the heat on for 20-30 minutes. Remove the heat if your skin turns bright red. This is especially important if you are unable to feel pain, heat, or cold. You may have a greater risk of getting burned. General instructions Change your sitting, standing, and sleeping habits as told by your health care provider. Avoid sitting in the same position for long periods of time. Change positions frequently. Lose weight or maintain a healthy weight as told by your health care provider. Do not use any products that contain nicotine or tobacco, such as cigarettes, e-cigarettes, and chewing tobacco. If you need help quitting, ask your health care provider. Wear supportive footwear. Keep all follow-up visits. This is important. This may include visits for physical therapy. Contact a health care provider if you: Have pain that does not go away within 1-4 weeks. Lose your appetite. Lose weight without trying. Get help right away if you: Have severe pain. Notice weakness in your arms, hands, or legs. Begin to lose control of your bladder or bowel movements. Have fevers or night sweats. Summary Degenerative disk disease is a condition caused by changes that occur in the spinal disks as a person ages. This condition can affect the whole spine.  However, the neck and lower back are most often affected. Take over-the-counter and prescription medicines only as told by your health care provider. This information is not intended to replace advice given to you by your health care provider. Make sure you discuss any questions you have with your health care provider. Document Revised: 03/18/2020 Document Reviewed: 03/18/2020 Elsevier Patient Education  [place].

## 2023-02-28 ENCOUNTER — Encounter: Payer: Self-pay | Admitting: Physician Assistant

## 2023-03-08 ENCOUNTER — Encounter: Payer: Self-pay | Admitting: Rehabilitative and Restorative Service Providers"

## 2023-03-08 ENCOUNTER — Other Ambulatory Visit: Payer: Self-pay

## 2023-03-08 ENCOUNTER — Ambulatory Visit: Payer: 59 | Attending: Physician Assistant | Admitting: Rehabilitative and Restorative Service Providers"

## 2023-03-08 DIAGNOSIS — Y929 Unspecified place or not applicable: Secondary | ICD-10-CM | POA: Insufficient documentation

## 2023-03-08 DIAGNOSIS — M542 Cervicalgia: Secondary | ICD-10-CM | POA: Diagnosis not present

## 2023-03-08 DIAGNOSIS — M6281 Muscle weakness (generalized): Secondary | ICD-10-CM | POA: Insufficient documentation

## 2023-03-08 DIAGNOSIS — R29898 Other symptoms and signs involving the musculoskeletal system: Secondary | ICD-10-CM | POA: Diagnosis not present

## 2023-03-08 DIAGNOSIS — R293 Abnormal posture: Secondary | ICD-10-CM

## 2023-03-08 DIAGNOSIS — M25562 Pain in left knee: Secondary | ICD-10-CM | POA: Diagnosis not present

## 2023-03-08 DIAGNOSIS — M25561 Pain in right knee: Secondary | ICD-10-CM | POA: Diagnosis not present

## 2023-03-08 DIAGNOSIS — Y939 Activity, unspecified: Secondary | ICD-10-CM | POA: Insufficient documentation

## 2023-03-08 DIAGNOSIS — M546 Pain in thoracic spine: Secondary | ICD-10-CM | POA: Insufficient documentation
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
## 2023-03-13 ENCOUNTER — Encounter: Payer: Self-pay | Admitting: Rehabilitative and Restorative Service Providers"

## 2023-03-13 ENCOUNTER — Ambulatory Visit: Payer: 59 | Admitting: Rehabilitative and Restorative Service Providers"

## 2023-03-13 DIAGNOSIS — M6281 Muscle weakness (generalized): Secondary | ICD-10-CM

## 2023-03-13 DIAGNOSIS — M546 Pain in thoracic spine: Secondary | ICD-10-CM

## 2023-03-13 DIAGNOSIS — R29898 Other symptoms and signs involving the musculoskeletal system: Secondary | ICD-10-CM

## 2023-03-13 DIAGNOSIS — R293 Abnormal posture: Secondary | ICD-10-CM

## 2023-03-13 DIAGNOSIS — M542 Cervicalgia: Secondary | ICD-10-CM | POA: Diagnosis not present

## 2023-03-13 DIAGNOSIS — M25561 Pain in right knee: Secondary | ICD-10-CM
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
## 2023-03-29 ENCOUNTER — Ambulatory Visit: Payer: 59 | Attending: Physician Assistant | Admitting: Rehabilitative and Restorative Service Providers"

## 2023-03-29 ENCOUNTER — Encounter: Payer: Self-pay | Admitting: Rehabilitative and Restorative Service Providers"

## 2023-03-29 DIAGNOSIS — R29898 Other symptoms and signs involving the musculoskeletal system: Secondary | ICD-10-CM | POA: Diagnosis present

## 2023-03-29 DIAGNOSIS — M25562 Pain in left knee: Secondary | ICD-10-CM | POA: Insufficient documentation

## 2023-03-29 DIAGNOSIS — M6281 Muscle weakness (generalized): Secondary | ICD-10-CM | POA: Diagnosis present

## 2023-03-29 DIAGNOSIS — M25561 Pain in right knee: Secondary | ICD-10-CM | POA: Diagnosis not present

## 2023-03-29 DIAGNOSIS — M546 Pain in thoracic spine: Secondary | ICD-10-CM | POA: Diagnosis present

## 2023-03-29 DIAGNOSIS — R293 Abnormal posture: Secondary | ICD-10-CM | POA: Insufficient documentation
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
## 2023-04-02 ENCOUNTER — Encounter: Payer: Self-pay | Admitting: *Deleted

## 2023-04-02 ENCOUNTER — Ambulatory Visit (INDEPENDENT_AMBULATORY_CARE_PROVIDER_SITE_OTHER): Payer: 59

## 2023-04-02 ENCOUNTER — Ambulatory Visit
Admission: EM | Admit: 2023-04-02 | Discharge: 2023-04-02 | Disposition: A | Discharge status: Home / Self Care | Payer: 59 | Attending: Family Medicine | Admitting: Family Medicine

## 2023-04-02 DIAGNOSIS — M25561 Pain in right knee: Secondary | ICD-10-CM | POA: Diagnosis not present

## 2023-04-02 MED ORDER — CELECOXIB 100 MG PO CAPS: 100.0000 mg | ORAL_CAPSULE | Freq: Two times a day (BID) | ORAL | 0 refills | Status: AC

## 2023-04-02 NOTE — ED Triage Notes (Signed)
Pt presents to [facility] with co of right leg/ knee pain since last night. Pt reports she went to the restroom last night and her right leg gave out. She did not fall. Pain is worse when walking, and bending.

## 2023-04-02 NOTE — Discharge Instructions (Addendum)
Advised of right knee x-ray results with hardcopy and images provided to patient.  Advised patient to take medication as directed with food to completion.  Encouraged patient to increase daily water intake to 64 ounces per day while taking this medication.  Advised if symptoms worsen and/or unresolved please follow-up with PCP or [facility] orthopedic provider for further evaluation.
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
## 2023-04-06 ENCOUNTER — Ambulatory Visit: Payer: 59 | Admitting: Rehabilitative and Restorative Service Providers"

## 2023-04-13 ENCOUNTER — Ambulatory Visit: Payer: 59

## 2023-04-16 ENCOUNTER — Ambulatory Visit: Payer: 59 | Admitting: Sports Medicine

## 2023-04-16 ENCOUNTER — Other Ambulatory Visit (INDEPENDENT_AMBULATORY_CARE_PROVIDER_SITE_OTHER): Payer: 59

## 2023-04-16 DIAGNOSIS — M170 Bilateral primary osteoarthritis of knee: Secondary | ICD-10-CM | POA: Diagnosis not present

## 2023-04-16 DIAGNOSIS — M67874 Other specified disorders of tendon, left ankle and foot: Secondary | ICD-10-CM | POA: Diagnosis not present

## 2023-04-16 NOTE — Progress Notes (Signed)
    Procedures performed today:    Procedure: Real-time Ultrasound Guided aspiration/injection of right knee Device: Samsung HS60  Verbal informed consent obtained.  Time-out conducted.  Noted no overlying erythema, induration, or other signs of local infection.  Skin prepped in a sterile fashion.  Local anesthesia: Topical Ethyl chloride.  With sterile technique and under real time ultrasound guidance: Noted effusion, aspirated 25 mL of clear, straw-colored fluid, syringe switched and 1 cc Kenalog 40, 2 cc lidocaine, 2 cc bupivacaine injected easily Completed without difficulty  Advised to call if fevers/chills, erythema, induration, drainage, or persistent bleeding.  Images permanently stored and available for review in PACS.  Impression: Technically successful ultrasound guided aspiration/injection.  Independent interpretation of notes and tests performed by another provider:   None.  Brief History, Exam, Impression, and Recommendations:    Primary osteoarthritis of both knees 57 year old female with bilateral knee osteoarthritis status post right knee arthroscopy with Dr. Luiz Blare, we finished Euflexxa November 21, 2022. She was doing extremely well but unfortunately now having increasing pain, swelling right knee. On exam she has an effusion, aspiration and injection was done. Return to see me in 6 weeks.  Achilles tendinosis of left ankle, insertional Persistent pain Achilles insertion left side, she has failed greater than 6 weeks of physical therapy, medications, heel lifts. Proceeding with MRI for likely retrocalcaneal bursa injection followed by boot immobilization.    ____________________________________________ Ihor Austin. Benjamin Stain, M.D., ABFM., CAQSM., AME. Primary Care and Sports Medicine [facility]  Adjunct Professor of Family Medicine  [place] [employer] [facility] [employer]  Restaurant manager, fast food

## 2023-04-16 NOTE — Assessment & Plan Note (Signed)
Persistent pain Achilles insertion left side, she has failed greater than 6 weeks of physical therapy, medications, heel lifts. Proceeding with MRI for likely retrocalcaneal bursa injection followed by boot immobilization.

## 2023-04-16 NOTE — Assessment & Plan Note (Signed)
57 year old female with bilateral knee osteoarthritis status post right knee arthroscopy with Dr. Luiz Blare, we finished Euflexxa November 21, 2022. She was doing extremely well but unfortunately now having increasing pain, swelling right knee. On exam she has an effusion, aspiration and injection was done. Return to see me in 6 weeks.

## 2023-04-19 ENCOUNTER — Ambulatory Visit: Payer: 59 | Attending: Physician Assistant | Admitting: Physical Therapy

## 2023-04-19 ENCOUNTER — Encounter: Payer: Self-pay | Admitting: Physical Therapy

## 2023-04-19 DIAGNOSIS — R29898 Other symptoms and signs involving the musculoskeletal system: Secondary | ICD-10-CM | POA: Diagnosis present

## 2023-04-19 DIAGNOSIS — M6281 Muscle weakness (generalized): Secondary | ICD-10-CM

## 2023-04-19 DIAGNOSIS — M546 Pain in thoracic spine: Secondary | ICD-10-CM | POA: Diagnosis present

## 2023-04-19 DIAGNOSIS — M25561 Pain in right knee: Secondary | ICD-10-CM | POA: Diagnosis present

## 2023-04-19 DIAGNOSIS — R293 Abnormal posture: Secondary | ICD-10-CM

## 2023-04-19 DIAGNOSIS — M25562 Pain in left knee: Secondary | ICD-10-CM | POA: Insufficient documentation
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
## 2023-04-21 ENCOUNTER — Ambulatory Visit (INDEPENDENT_AMBULATORY_CARE_PROVIDER_SITE_OTHER): Payer: 59

## 2023-04-21 DIAGNOSIS — M67874 Other specified disorders of tendon, left ankle and foot: Secondary | ICD-10-CM

## 2023-04-25 ENCOUNTER — Other Ambulatory Visit: Payer: Self-pay | Admitting: Physician Assistant

## 2023-04-25 DIAGNOSIS — K2100 Gastro-esophageal reflux disease with esophagitis, without bleeding: Secondary | ICD-10-CM

## 2023-05-05 ENCOUNTER — Other Ambulatory Visit: Payer: 59

## 2023-05-07 ENCOUNTER — Telehealth: Payer: Self-pay | Admitting: Physician Assistant

## 2023-05-07 NOTE — Telephone Encounter (Signed)
Patient notified

## 2023-05-07 NOTE — Telephone Encounter (Signed)
Pt called. Pt states her job wants to know if she is going to be out for a week only after she gets the shot in her heel at her appointment on Friday 5/24. If she is going to be out longer than a week (if its a week she can use her vacation time) they would need to know so they can begin  [facility] paperwork.

## 2023-05-07 NOTE — Telephone Encounter (Signed)
For retrocalcaneal bursitis she would be in a cast boot for a week, if she can wear the boot at work then she can return immediately.  Otherwise 1 week out.

## 2023-05-11 ENCOUNTER — Encounter: Payer: Self-pay | Admitting: Sports Medicine

## 2023-05-11 ENCOUNTER — Ambulatory Visit: Payer: 59 | Admitting: Sports Medicine

## 2023-05-11 ENCOUNTER — Other Ambulatory Visit (INDEPENDENT_AMBULATORY_CARE_PROVIDER_SITE_OTHER): Payer: 59

## 2023-05-11 DIAGNOSIS — M67874 Other specified disorders of tendon, left ankle and foot: Secondary | ICD-10-CM

## 2023-05-11 NOTE — Progress Notes (Signed)
    Procedures performed today:    Procedure: Real-time Ultrasound Guided injection of the left retrocalcaneal bursa Device: Samsung HS60  Verbal informed consent obtained.  Time-out conducted.  Noted no overlying erythema, induration, or other signs of local infection.  Skin prepped in a sterile fashion.  Local anesthesia: Topical Ethyl chloride.  With sterile technique and under real time ultrasound guidance: Noted retrocalcaneal bursitis, 1 cc kenalog 40, 1 cc lidocaine injected easily into the bursa taking care to avoid intra Achilles tendon injection, completed without difficulty.   Advised to call if fevers/chills, erythema, induration, drainage, or persistent bleeding.  Images permanently stored and available for review in PACS.  Impression: Technically successful ultrasound guided injection.  Independent interpretation of notes and tests performed by another provider:   None.  Brief History, Exam, Impression, and Recommendations:    Achilles tendinosis of left ankle, insertional [name] returns, she is a pleasant 57 year old female, she has had now several months of pain Achilles insertion left posterior heel, she has failed 6 weeks of physical therapy, medications, heel lifts, we obtained an MRI that did show retrocalcaneal bursitis as well as some intra Achilles T2 signal, today we will perform a retrocalcaneal bursa injection followed by 1 week of boot immobilization, return to see me in 6 weeks. Patient aware of the slightly increased risk of Achilles rupture.    ____________________________________________ Ihor Austin. Benjamin Stain, M.D., ABFM., CAQSM., AME. Primary Care and Sports Medicine [facility]  Adjunct Professor of Family Medicine  [place] [employer] [facility] [employer]  Restaurant manager, fast food

## 2023-05-11 NOTE — Assessment & Plan Note (Signed)
Joanne Winters returns, she is a pleasant 57 year old female, she has had now several months of pain Achilles insertion left posterior heel, she has failed 6 weeks of physical therapy, medications, heel lifts, we obtained an MRI that did show retrocalcaneal bursitis as well as some intra Achilles T2 signal, today we will perform a retrocalcaneal bursa injection followed by 1 week of boot immobilization, return to see me in 6 weeks. Patient aware of the slightly increased risk of Achilles rupture.

## 2023-05-21 ENCOUNTER — Ambulatory Visit: Payer: 59 | Admitting: Sports Medicine

## 2023-05-22 ENCOUNTER — Telehealth: Payer: Self-pay | Admitting: Sports Medicine

## 2023-05-22 NOTE — Telephone Encounter (Signed)
Okay so to clarify, the note needs to say "stop riding equipment BT stacker?"

## 2023-05-22 NOTE — Telephone Encounter (Signed)
Patient called she asked for a note to stop riding equipment Bt stacker can be uploaded to [employer]

## 2023-05-23 ENCOUNTER — Encounter: Payer: Self-pay | Admitting: Sports Medicine

## 2023-05-23 NOTE — Telephone Encounter (Signed)
Yes that's what is needs to say

## 2023-05-23 NOTE — Telephone Encounter (Signed)
DOne

## 2023-06-04 ENCOUNTER — Telehealth: Payer: Self-pay | Admitting: Physician Assistant

## 2023-06-04 NOTE — Telephone Encounter (Signed)
Pt stopped in to ask for a letter from Dr.T. One was wrote and uploaded on mychart on 06/05. However her employer is asking for more detail. Pt asked if a revised letter could be written and then uploaded to mychart. She will then come into office to pick up a hard copy.

## 2023-06-05 NOTE — Telephone Encounter (Signed)
Left message advising updated letter is in MyChart.

## 2023-06-05 NOTE — Telephone Encounter (Signed)
Letter written and uploaded to my chart

## 2023-06-07 ENCOUNTER — Telehealth: Payer: Self-pay | Admitting: Physician Assistant

## 2023-06-07 NOTE — Telephone Encounter (Signed)
Pt came in on Monday to request updated letter for employer. Employer is now wanting a specific time frame. Pt has appt on 07/05, If you could extend time frame until then, that would be great.

## 2023-06-07 NOTE — Telephone Encounter (Signed)
Ok done

## 2023-06-11 ENCOUNTER — Ambulatory Visit: Payer: 59 | Admitting: Sports Medicine

## 2023-06-22 ENCOUNTER — Ambulatory Visit (INDEPENDENT_AMBULATORY_CARE_PROVIDER_SITE_OTHER): Payer: 59 | Admitting: Sports Medicine

## 2023-06-22 DIAGNOSIS — M67874 Other specified disorders of tendon, left ankle and foot: Secondary | ICD-10-CM

## 2023-06-22 NOTE — Assessment & Plan Note (Signed)
This pleasant 57 year old female returns, she has had many months of pain Achilles insertion left posterior heel, she failed 6 weeks of physical therapy, medications, heel lifts we obtained an MRI that did show retrocalcaneal bursitis, she also had some intra Achilles T2 signal, at the last visit approximately 6 weeks ago we we performed an ultrasound-guided retrocalcaneal bursa injection followed by 1 week of boot immobilization, she has slight improvement and then worsening of pain when she returned to work. At this point we will need to shut her down, I am adding immobilization, she will continue the boot now for at least 6 weeks, out of work, this will likely trigger FMLA and disability paperwork, she understand she will need an appointment to fill this out. I will also have her do some physical therapy maybe a couple of times a week, she can come out of the boot for PT.

## 2023-06-22 NOTE — Progress Notes (Signed)
    Procedures performed today:    None.  Independent interpretation of notes and tests performed by another provider:   None.  Brief History, Exam, Impression, and Recommendations:    Achilles tendinosis of left ankle, insertional This pleasant 57 year old female returns, she has had many months of pain Achilles insertion left posterior heel, she failed 6 weeks of physical therapy, medications, heel lifts we obtained an MRI that did show retrocalcaneal bursitis, she also had some intra Achilles T2 signal, at the last visit approximately 6 weeks ago we we performed an ultrasound-guided retrocalcaneal bursa injection followed by 1 week of boot immobilization, she has slight improvement and then worsening of pain when she returned to work. At this point we will need to shut her down, I am adding immobilization, she will continue the boot now for at least 6 weeks, out of work, this will likely trigger FMLA and disability paperwork, she understand she will need an appointment to fill this out. I will also have her do some physical therapy maybe a couple of times a week, she can come out of the boot for PT.    ____________________________________________ Ihor Austin. Benjamin Stain, M.D., ABFM., CAQSM., AME. Primary Care and Sports Medicine [facility]  Adjunct Professor of Family Medicine  [place] [employer] [facility] [employer]  Restaurant manager, fast food

## 2023-06-27 ENCOUNTER — Ambulatory Visit: Payer: 59 | Admitting: Sports Medicine

## 2023-06-27 ENCOUNTER — Encounter: Payer: Self-pay | Admitting: Sports Medicine

## 2023-06-27 VITALS — BP 168/97 | HR 74

## 2023-06-27 DIAGNOSIS — M67874 Other specified disorders of tendon, left ankle and foot: Secondary | ICD-10-CM | POA: Diagnosis not present

## 2023-06-27 NOTE — Assessment & Plan Note (Signed)
Previous history: This pleasant 57 year old female returns, she has had many months of pain Achilles insertion left posterior heel, she failed 6 weeks of physical therapy, medications, heel lifts we obtained an MRI that did show retrocalcaneal bursitis, she also had some intra Achilles T2 signal, at the last visit approximately 6 weeks ago we we performed an ultrasound-guided retrocalcaneal bursa injection followed by 1 week of boot immobilization, she has slight improvement and then worsening of pain when she returned to work. At this point we will need to shut her down, I am adding immobilization, she will continue the boot now for at least 6 weeks, out of work, this will likely trigger FMLA and disability paperwork, she understand she will need an appointment to fill this out. I will also have her do some physical therapy maybe a couple of times a week, she can come out of the boot for PT.  Update: Today we filled out extensive FMLA paperwork with continuous out of work June 25, 2023 through August 26, 2023.  I will see her back 6 weeks or so from today, we will get her out of the boot and if she is doing better she will need an additional 2 weeks of therapy to get her range of motion and strength back.  If not better we will refer her to foot and ankle surgery.

## 2023-06-27 NOTE — Progress Notes (Signed)
    Procedures performed today:    None.  Independent interpretation of notes and tests performed by another provider:   None.  Brief History, Exam, Impression, and Recommendations:    Achilles tendinosis of left ankle, insertional Previous history: This pleasant 57 year old female returns, she has had many months of pain Achilles insertion left posterior heel, she failed 6 weeks of physical therapy, medications, heel lifts we obtained an MRI that did show retrocalcaneal bursitis, she also had some intra Achilles T2 signal, at the last visit approximately 6 weeks ago we we performed an ultrasound-guided retrocalcaneal bursa injection followed by 1 week of boot immobilization, she has slight improvement and then worsening of pain when she returned to work. At this point we will need to shut her down, I am adding immobilization, she will continue the boot now for at least 6 weeks, out of work, this will likely trigger FMLA and disability paperwork, she understand she will need an appointment to fill this out. I will also have her do some physical therapy maybe a couple of times a week, she can come out of the boot for PT.  Update: Today we filled out extensive FMLA paperwork with continuous out of work June 25, 2023 through August 26, 2023.  I will see her back 6 weeks or so from today, we will get her out of the boot and if she is doing better she will need an additional 2 weeks of therapy to get her range of motion and strength back.  If not better we will refer her to foot and ankle surgery.    ____________________________________________ Ihor Austin. Benjamin Stain, M.D., ABFM., CAQSM., AME. Primary Care and Sports Medicine [facility]  Adjunct Professor of Family Medicine  [place] [employer] [facility] [employer]  Restaurant manager, fast food

## 2023-07-09 ENCOUNTER — Other Ambulatory Visit: Payer: Self-pay

## 2023-07-09 ENCOUNTER — Encounter: Payer: Self-pay | Admitting: Physical Therapy

## 2023-07-09 ENCOUNTER — Ambulatory Visit: Payer: 59 | Attending: Sports Medicine | Admitting: Physical Therapy

## 2023-07-09 DIAGNOSIS — M6281 Muscle weakness (generalized): Secondary | ICD-10-CM | POA: Insufficient documentation

## 2023-07-09 DIAGNOSIS — M25572 Pain in left ankle and joints of left foot: Secondary | ICD-10-CM | POA: Diagnosis present

## 2023-07-09 DIAGNOSIS — R262 Difficulty in walking, not elsewhere classified: Secondary | ICD-10-CM | POA: Insufficient documentation

## 2023-07-09 DIAGNOSIS — M67874 Other specified disorders of tendon, left ankle and foot: Secondary | ICD-10-CM | POA: Diagnosis present
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
## 2023-07-18 ENCOUNTER — Ambulatory Visit: Payer: 59

## 2023-07-18 DIAGNOSIS — R262 Difficulty in walking, not elsewhere classified: Secondary | ICD-10-CM

## 2023-07-18 DIAGNOSIS — M6281 Muscle weakness (generalized): Secondary | ICD-10-CM

## 2023-07-18 DIAGNOSIS — M25572 Pain in left ankle and joints of left foot: Secondary | ICD-10-CM
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
## 2023-07-23 ENCOUNTER — Encounter: Payer: Self-pay | Admitting: Sports Medicine

## 2023-07-23 ENCOUNTER — Ambulatory Visit: Payer: 59 | Admitting: Sports Medicine

## 2023-07-23 VITALS — BP 143/92 | HR 83

## 2023-07-23 DIAGNOSIS — M67874 Other specified disorders of tendon, left ankle and foot: Secondary | ICD-10-CM

## 2023-07-23 NOTE — Assessment & Plan Note (Signed)
Previous history: This pleasant 57 year old female returns, she has had many months of pain Achilles insertion left posterior heel, she failed 6 weeks of physical therapy, medications, heel lifts we obtained an MRI that did show retrocalcaneal bursitis, she also had some intra Achilles T2 signal, at the last visit approximately 6 weeks ago we we performed an ultrasound-guided retrocalcaneal bursa injection followed by 1 week of boot immobilization, she has slight improvement and then worsening of pain when she returned to work. At this point we will need to shut her down, I am adding immobilization, she will continue the boot now for at least 6 weeks, out of work, this will likely trigger FMLA and disability paperwork, she understand she will need an appointment to fill this out. I will also have her do some physical therapy maybe a couple of times a week, she can come out of the boot for PT.  Update: Today we filled out extensive FMLA paperwork with continuous out of work June 25, 2023 through August 26, 2023.  I will see her back 6 weeks or so from today, we will get her out of the boot and if she is doing better she will need an additional 2 weeks of therapy to get her range of motion and strength back.  If not better we will refer her to foot and ankle surgery.  Update #2: Today I spent approximately 15 minutes total time filling out FMLA and disability paperwork, patient is doing okay, she does have a follow-up at the end of the month for her boot immobilization.

## 2023-07-23 NOTE — Progress Notes (Signed)
    Procedures performed today:    None.  Independent interpretation of notes and tests performed by another provider:   None.  Brief History, Exam, Impression, and Recommendations:    Achilles tendinosis of left ankle, insertional Previous history: This pleasant 57 year old female returns, she has had many months of pain Achilles insertion left posterior heel, she failed 6 weeks of physical therapy, medications, heel lifts we obtained an MRI that did show retrocalcaneal bursitis, she also had some intra Achilles T2 signal, at the last visit approximately 6 weeks ago we we performed an ultrasound-guided retrocalcaneal bursa injection followed by 1 week of boot immobilization, she has slight improvement and then worsening of pain when she returned to work. At this point we will need to shut her down, I am adding immobilization, she will continue the boot now for at least 6 weeks, out of work, this will likely trigger FMLA and disability paperwork, she understand she will need an appointment to fill this out. I will also have her do some physical therapy maybe a couple of times a week, she can come out of the boot for PT.  Update: Today we filled out extensive FMLA paperwork with continuous out of work June 25, 2023 through August 26, 2023.  I will see her back 6 weeks or so from today, we will get her out of the boot and if she is doing better she will need an additional 2 weeks of therapy to get her range of motion and strength back.  If not better we will refer her to foot and ankle surgery.  Update #2: Today I spent approximately 15 minutes total time filling out FMLA and disability paperwork, patient is doing okay, she does have a follow-up at the end of the month for her boot immobilization.    ____________________________________________ Ihor Austin. Benjamin Stain, M.D., ABFM., CAQSM., AME. Primary Care and Sports Medicine [facility]  Adjunct Professor of Family  Medicine  [place] [employer]  Restaurant manager, fast food

## 2023-07-24 ENCOUNTER — Ambulatory Visit: Payer: 59

## 2023-07-31 ENCOUNTER — Other Ambulatory Visit: Payer: Self-pay | Admitting: Sports Medicine

## 2023-07-31 MED ORDER — CARBINOXAMINE MALEATE ER 4 MG/5ML PO SUER
10.0000 mL | Freq: Two times a day (BID) | ORAL | 0 refills | Status: DC
Start: 2023-07-31 — End: 2023-08-08

## 2023-08-02 ENCOUNTER — Ambulatory Visit: Payer: 59 | Attending: Sports Medicine

## 2023-08-02 ENCOUNTER — Other Ambulatory Visit: Payer: Self-pay | Admitting: Physician Assistant

## 2023-08-02 DIAGNOSIS — R262 Difficulty in walking, not elsewhere classified: Secondary | ICD-10-CM | POA: Diagnosis present

## 2023-08-02 DIAGNOSIS — M25572 Pain in left ankle and joints of left foot: Secondary | ICD-10-CM | POA: Diagnosis present

## 2023-08-02 DIAGNOSIS — M25562 Pain in left knee: Secondary | ICD-10-CM | POA: Insufficient documentation

## 2023-08-02 DIAGNOSIS — M6281 Muscle weakness (generalized): Secondary | ICD-10-CM | POA: Diagnosis present

## 2023-08-02 DIAGNOSIS — M25561 Pain in right knee: Secondary | ICD-10-CM | POA: Insufficient documentation
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
## 2023-08-08 ENCOUNTER — Other Ambulatory Visit: Payer: Self-pay | Admitting: Sports Medicine

## 2023-08-08 ENCOUNTER — Telehealth: Payer: Self-pay | Admitting: Physician Assistant

## 2023-08-08 ENCOUNTER — Encounter: Payer: Self-pay | Admitting: Sports Medicine

## 2023-08-08 ENCOUNTER — Ambulatory Visit: Payer: 59 | Admitting: Sports Medicine

## 2023-08-08 DIAGNOSIS — M67874 Other specified disorders of tendon, left ankle and foot: Secondary | ICD-10-CM

## 2023-08-08 DIAGNOSIS — M170 Bilateral primary osteoarthritis of knee: Secondary | ICD-10-CM

## 2023-08-08 MED ORDER — CARBINOXAMINE MALEATE ER 4 MG/5ML PO SUER
10.0000 mL | Freq: Two times a day (BID) | ORAL | 0 refills | Status: DC
Start: 2023-08-08 — End: 2023-11-22

## 2023-08-08 NOTE — Assessment & Plan Note (Signed)
Joanne Winters returns, she is a very pleasant 57 year old female, she has had many months of pain left Achilles insertion, pain at the posterior heel, we placed her through 6 weeks of physical therapy, showed medications, heel lifts, ultimately an MRI did show retrocalcaneal bursitis, she had some intra Achilles T2 signal. Approximately 8 weeks ago we performed ultrasound-guided retrocalcaneal bursa injection taking care to avoid intra Achilles injection, we followed this by 1 week of boot immobilization to protect the Achilles. She had slight improvement, but then unfortunate worsening of pain. 6 weeks ago completely shut her down, back into the boot. Unfortunately she has not noted any improvement at all. We have filled out several sets of FMLA and disability paperwork with continuous out of work July through September 8. I did let her know that at this point since we have been dealing with this since before January due to failure of extensive conservative treatment I would like her to touch base with foot and ankle surgery downstairs.

## 2023-08-08 NOTE — Telephone Encounter (Signed)
While checking in for appt, pt asked if prescription for [facility] could be resent to [facility] on [address]. [facility] has told her that they are out of stock. It has been a few days and they still don't have it.

## 2023-08-08 NOTE — Progress Notes (Signed)
    Procedures performed today:    None.  Independent interpretation of notes and tests performed by another provider:   None.  Brief History, Exam, Impression, and Recommendations:    Achilles tendinosis of left ankle, insertional Joanne Winters returns, she is a very pleasant 57 year old female, she has had many months of pain left Achilles insertion, pain at the posterior heel, we placed her through 6 weeks of physical therapy, showed medications, heel lifts, ultimately an MRI did show retrocalcaneal bursitis, she had some intra Achilles T2 signal. Approximately 8 weeks ago we performed ultrasound-guided retrocalcaneal bursa injection taking care to avoid intra Achilles injection, we followed this by 1 week of boot immobilization to protect the Achilles. She had slight improvement, but then unfortunate worsening of pain. 6 weeks ago completely shut her down, back into the boot. Unfortunately she has not noted any improvement at all. We have filled out several sets of FMLA and disability paperwork with continuous out of work July through September 8. I did let her know that at this point since we have been dealing with this since before January due to failure of extensive conservative treatment I would like her to touch base with foot and ankle surgery downstairs.    ____________________________________________ Ihor Austin. Benjamin Stain, M.D., ABFM., CAQSM., AME. Primary Care and Sports Medicine [facility]  Adjunct Professor of Family Medicine  [place] [employer] [facility] [employer] [employer]  Restaurant manager, fast food

## 2023-08-08 NOTE — Telephone Encounter (Signed)
LVM for pt that script was sent to [facility]

## 2023-08-08 NOTE — Telephone Encounter (Signed)
Sent to [facility]

## 2023-08-16 ENCOUNTER — Other Ambulatory Visit: Payer: Self-pay

## 2023-08-16 ENCOUNTER — Ambulatory Visit (INDEPENDENT_AMBULATORY_CARE_PROVIDER_SITE_OTHER): Payer: 59

## 2023-08-16 ENCOUNTER — Ambulatory Visit: Payer: 59 | Admitting: Podiatry

## 2023-08-16 ENCOUNTER — Encounter: Payer: Self-pay | Admitting: Podiatry

## 2023-08-16 ENCOUNTER — Other Ambulatory Visit: Payer: Self-pay | Admitting: Podiatry

## 2023-08-16 DIAGNOSIS — M79672 Pain in left foot: Secondary | ICD-10-CM

## 2023-08-16 DIAGNOSIS — M67874 Other specified disorders of tendon, left ankle and foot: Secondary | ICD-10-CM

## 2023-08-16 MED ORDER — MELOXICAM 15 MG PO TABS: 15.0000 mg | ORAL_TABLET | Freq: Every day | ORAL | 0 refills | Status: DC

## 2023-08-16 NOTE — Patient Instructions (Signed)

## 2023-08-16 NOTE — Progress Notes (Signed)
  Subjective:  Patient ID: Joanne Winters, female    DOB: [birth year],   MRN: [id number]  No chief complaint on file.   57 y.o. female presents for concern of left achilles tendonitis that has been going on for 6+ months. She has been in the care of Dr. Karie Schwalbe and has undergone PT, injections, and immobilization and continued to have pain in the heel. She recently had an MRI that showed partial tear and tendinosis of bursitis. Was sent here to discuss possible surgical intervention as conservative treatment has been exhausted.  . Denies any other pedal complaints.  She is not diabetic and does not smoke. Denies n/v/f/c.   Past Medical History:  Diagnosis Date   Essential hypertension 12/06/2015   GERD (gastroesophageal reflux disease)    Menorrhagia    Morbid obesity (HCC) 12/22/2014   Seasonal allergies     Objective:  Physical Exam: Vascular: DP/PT pulses 2/4 bilateral. CFT <3 seconds. Normal hair growth on digits. No edema.  Skin. No lacerations or abrasions bilateral feet.  Musculoskeletal: MMT 5/5 bilateral lower extremities in DF, PF, Inversion and Eversion. Deceased ROM in DF of ankle joint. Tender to insertion of left achilles tendon. Minimal pain tracking proximally along the tendon.  Neurological: Sensation intact to light touch.   MRI left heel IMPRESSION: 1. Moderate tendinosis of the distal Achilles tendon with a small insertional interstitial tear along the ventral aspect at the insertion and mild peritenonitis. Mild retrocalcaneal bursitis. 2. Mild plantar fasciitis of the medial band of the plantar fascia.  Assessment:   1. Achilles tendinosis of left ankle, insertional      Plan:  Patient was evaluated and treated and all questions answered. -Xrays reviewed and MRI reviewed. Spurring noted to the posterior calcaneus with significant haglunds deformity noted.  -Discussed Achilles insertional tendonitis and treatment options with patient.  -Discussed stretching exercises  anti-inflammatories and offloading. At this point she has exhuasted conservative treamtents and suggest next steps would be surgical intervention.  -Will refill meloxicam at this time and dispensed heel lift to aid with work.  -Discussed secondary achilles repair with resection of haglunds deformity.  -Informed surgical risk consent was reviewed and read aloud to the patient.  I reviewed the films.  I have discussed my findings with the patient in great detail.  I have discussed all risks including but not limited to infection, stiffness, scarring, limp, disability, deformity, damage to blood vessels and nerves, numbness, poor healing, need for braces, arthritis, chronic pain, amputation, death.  All benefits and realistic expectations discussed in great detail.  I have made no promises as to the outcome.  I have provided realistic expectations.  I have offered the patient a 2nd opinion, which they have declined and assured me they preferred to proceed despite the risks. -Will plan for surgery in November after her trip.  -Plan for pain and nausea medication post-op as well as antibiotics and ASA BID for thrombosis prophylaxis.   Louann Sjogren, DPM

## 2023-08-17 ENCOUNTER — Encounter: Payer: Self-pay | Admitting: Sports Medicine

## 2023-08-17 ENCOUNTER — Ambulatory Visit: Payer: 59 | Admitting: Sports Medicine

## 2023-08-17 DIAGNOSIS — M67874 Other specified disorders of tendon, left ankle and foot: Secondary | ICD-10-CM | POA: Diagnosis not present

## 2023-08-17 NOTE — Assessment & Plan Note (Signed)
This pleasant 57 year old female returns, she has had many months of pain left Achilles insertion, posterior heel, she had over 6 weeks of physical therapy, medications, heel lifts, ultimately MRI showed retrocalcaneal bursitis and some intra Achilles T2 signal. Approximately 9 weeks ago we performed an ultrasound-guided retrocalcaneal bursa injection taking care to avoid the Achilles, we did follow this by 1 week of boot immobilization. She had slight improvement but unfortunate worsening of pain, we then shut her down completely for 6 weeks in the boot, still had no improvement, we then got a consultation with foot and ankle surgery. Surgery is planned. Work note written, she can return to see me on an as-needed basis.

## 2023-08-17 NOTE — Progress Notes (Signed)
    Procedures performed today:    None.  Independent interpretation of notes and tests performed by another provider:   None.  Brief History, Exam, Impression, and Recommendations:    Achilles tendinosis of left ankle, insertional This pleasant 57 year old female returns, she has had many months of pain left Achilles insertion, posterior heel, she had over 6 weeks of physical therapy, medications, heel lifts, ultimately MRI showed retrocalcaneal bursitis and some intra Achilles T2 signal. Approximately 9 weeks ago we performed an ultrasound-guided retrocalcaneal bursa injection taking care to avoid the Achilles, we did follow this by 1 week of boot immobilization. She had slight improvement but unfortunate worsening of pain, we then shut her down completely for 6 weeks in the boot, still had no improvement, we then got a consultation with foot and ankle surgery. Surgery is planned. Work note written, she can return to see me on an as-needed basis.    ____________________________________________ Ihor Austin. Benjamin Stain, M.D., ABFM., CAQSM., AME. Primary Care and Sports Medicine [facility]  Adjunct Professor of Family Medicine  [place] [employer]  Restaurant manager, fast food

## 2023-08-21 ENCOUNTER — Encounter: Payer: Self-pay | Admitting: Podiatry

## 2023-08-22 ENCOUNTER — Telehealth: Payer: Self-pay

## 2023-08-22 NOTE — Telephone Encounter (Signed)
Patient walked in needing paperwork faxed over to [employer] and would also like completed document scanned into MyChart.  Patient would like a call once faxed and viewable in MyChart.

## 2023-09-06 ENCOUNTER — Other Ambulatory Visit: Payer: Self-pay | Admitting: Sports Medicine

## 2023-09-06 ENCOUNTER — Other Ambulatory Visit: Payer: Self-pay | Admitting: Physician Assistant

## 2023-09-06 DIAGNOSIS — M170 Bilateral primary osteoarthritis of knee: Secondary | ICD-10-CM

## 2023-09-06 DIAGNOSIS — M549 Dorsalgia, unspecified: Secondary | ICD-10-CM

## 2023-09-06 DIAGNOSIS — M5030 Other cervical disc degeneration, unspecified cervical region: Secondary | ICD-10-CM

## 2023-09-06 DIAGNOSIS — M542 Cervicalgia: Secondary | ICD-10-CM

## 2023-09-15 ENCOUNTER — Other Ambulatory Visit: Payer: Self-pay | Admitting: Podiatry

## 2023-09-24 ENCOUNTER — Ambulatory Visit: Payer: 59 | Admitting: Physician Assistant

## 2023-09-24 ENCOUNTER — Encounter: Payer: Self-pay | Admitting: Physician Assistant

## 2023-09-24 VITALS — BP 146/79 | HR 83 | Ht 65.5 in | Wt 218.0 lb

## 2023-09-24 DIAGNOSIS — M79661 Pain in right lower leg: Secondary | ICD-10-CM

## 2023-09-24 DIAGNOSIS — Z86718 Personal history of other venous thrombosis and embolism: Secondary | ICD-10-CM

## 2023-09-24 DIAGNOSIS — Z79899 Other long term (current) drug therapy: Secondary | ICD-10-CM | POA: Diagnosis not present

## 2023-09-24 DIAGNOSIS — M170 Bilateral primary osteoarthritis of knee: Secondary | ICD-10-CM | POA: Diagnosis not present

## 2023-09-24 DIAGNOSIS — F40243 Fear of flying: Secondary | ICD-10-CM | POA: Diagnosis not present

## 2023-09-24 MED ORDER — ALPRAZOLAM 0.5 MG PO TABS: ORAL_TABLET | ORAL | 0 refills | Status: DC

## 2023-09-24 MED ORDER — TRAMADOL HCL 50 MG PO TABS
50.0000 mg | ORAL_TABLET | Freq: Four times a day (QID) | ORAL | 0 refills | Status: AC | PRN
Start: 2023-09-24 — End: 2023-09-29
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
## 2023-09-24 NOTE — Patient Instructions (Signed)

## 2023-09-25 ENCOUNTER — Encounter: Payer: Self-pay | Admitting: Physician Assistant

## 2023-09-25 DIAGNOSIS — Z86718 Personal history of other venous thrombosis and embolism: Secondary | ICD-10-CM | POA: Insufficient documentation

## 2023-09-25 LAB — CMP14+EGFR
ALT: 14 [IU]/L (ref 0–32)
AST: 18 [IU]/L (ref 0–40)
Albumin: 4.6 g/dL (ref 3.8–4.9)
Alkaline Phosphatase: 78 [IU]/L (ref 44–121)
BUN/Creatinine Ratio: 13 (ref 9–23)
BUN: 9 mg/dL (ref 6–24)
Bilirubin Total: 0.3 mg/dL (ref 0.0–1.2)
CO2: 25 mmol/L (ref 20–29)
Calcium: 9.5 mg/dL (ref 8.7–10.2)
Chloride: 102 mmol/L (ref 96–106)
Creatinine, Ser: 0.69 mg/dL (ref 0.57–1.00)
Globulin, Total: 3.2 g/dL (ref 1.5–4.5)
Glucose: 82 mg/dL (ref 70–99)
Potassium: 3.5 mmol/L (ref 3.5–5.2)
Sodium: 141 mmol/L (ref 134–144)
Total Protein: 7.8 g/dL (ref 6.0–8.5)
eGFR: 101 mL/min/{1.73_m2} (ref >59)

## 2023-09-25 LAB — D-DIMER, QUANTITATIVE: D-DIMER: 0.22 mg{FEU}/L (ref 0.00–0.49)

## 2023-09-25 NOTE — Progress Notes (Signed)
Negative d-dimer. No blood clot.

## 2023-10-04 ENCOUNTER — Telehealth: Payer: Self-pay | Admitting: Podiatry

## 2023-10-04 NOTE — Telephone Encounter (Signed)
DOS- 10/30/2023  CALCANEAL OSTEOTOMY (POSTERIOR) LT - 44034 ACHILLES REPAIR LT - [id number]  [facility] EFFECTIVE DATE- 12/18/2022  DEDUCTIBLE- $500.00 WITH REMAINING $0.00 OOP- $3500.00 WITH REMAINING $2138.40 COINSURANCE- 10%  PER THE [employer] WEBSITE PORTAL, PRIOR AUTH IS NOT REQUIRED FOR CPT CODES 56387 AND [phone].   AUTH Decision ID #: [id number]

## 2023-10-09 ENCOUNTER — Telehealth: Payer: Self-pay | Admitting: Podiatry

## 2023-10-09 NOTE — Telephone Encounter (Signed)
Patient's surgery is scheduled for 10/30/2023.  She was to get the necessary paperwork for [facility] and fax that in after her vacation in [place] - last week.  I got a message from Joanne Winters this afternoon stating she was going to postpone her surgery until next year -- she was going to spend some time in [place].  I tried to call her back [phone] to confirm but she did not answer and her VM was full.

## 2023-10-26 ENCOUNTER — Telehealth: Payer: Self-pay | Admitting: Podiatry

## 2023-10-26 NOTE — Telephone Encounter (Signed)
WAS INFORMED BY THE SURGERY CENTER THAT PT CANCELLED SURGERY, I CALLED PT TO CONFIRM AND PT STATED THAT SHE DID INDEED WANT TO CANCEL SURGERY FOR 11/12. SHE STATED THAT SHE IS FEELING BETTER NOW.

## 2023-11-08 ENCOUNTER — Encounter: Payer: 59 | Admitting: Podiatry

## 2023-11-21 ENCOUNTER — Other Ambulatory Visit: Payer: Self-pay | Admitting: Physician Assistant

## 2023-11-22 ENCOUNTER — Encounter: Payer: 59 | Admitting: Podiatry

## 2023-12-14 ENCOUNTER — Ambulatory Visit: Payer: 59 | Admitting: Physician Assistant

## 2023-12-14 VITALS — BP 148/71 | HR 72 | Ht 65.0 in | Wt 219.0 lb

## 2023-12-14 DIAGNOSIS — R600 Localized edema: Secondary | ICD-10-CM | POA: Diagnosis not present
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
## 2023-12-14 NOTE — Patient Instructions (Addendum)
Low pressure (under 20 mmHg) Medium pressure (20 mmHg to 29 mmHg) High pressure (30 mmHg to 40 mmHg)  Start HcTZ as needed for swelling when working  Chronic Venous Insufficiency Chronic venous insufficiency is a condition that causes the veins in the legs to struggle to pump blood from the legs to the heart. It is also called venous stasis. This condition can happen when the vein walls are stretched, weakened, or damaged. It can also happen when the valves inside the vein are damaged. With the right treatment, you should be able to still lead an active life. What are the causes? Common causes of this condition include: Venous hypertension. This is high blood pressure inside the veins. Sitting or standing too long. This can cause increased blood pressure in the veins of the leg. Deep vein thrombosis (DVT). This is a blood clot that blocks blood flow in a vein. Phlebitis. This is inflammation of a vein. It can cause a blood clot to form. An abnormal growth of cells (tumor) in the area between your hip bones (pelvis). This can cause blood to back up. What increases the risk? Factors that may make you more likely to get this condition include: Having a family history of the condition. Being overweight. Being pregnant. Not getting enough exercise. Smoking. Having a job that requires you to sit or stand in one place for a long time. Being a certain age. Females in their 2s and 31s and males in their 90s are more likely to get this condition. What are the signs or symptoms? Symptoms of this condition include: Varicose veins. These are veins that are enlarged, bulging, or twisted. Skin breakdown or ulcers. Reddened skin or dark discoloration of the skin on the leg between the knee and ankle. Lipodermatosclerosis. This is brown, smooth, tight, and painful skin just above the ankle. It is often on the inside of the leg. Swelling of the legs. How is this diagnosed? This condition may be  diagnosed based on your medical history and a physical exam. You may also need tests, such as: A duplex ultrasound. This shows how blood flows through a blood vessel. Plethysmography. This tests blood flow. Venogram. This looks at the veins using an X-ray and dye. How is this treated? The goals of treatment are to help you return to an active life and to relieve pain. Treatment may include: Wearing compression stockings. These do not cure the condition but can help relieve symptoms. They can also help stop your condition from getting worse. Sclerotherapy. This involves injecting a solution to shrink damaged veins. Surgery. This may include: Vein stripping. This is when a diseased vein is taken out. Laser ablation surgery. This is when blood flow is cut off through the vein. Repairing or remaking a valve inside the affected vein. Follow these instructions at home: Lifestyle Do not use any products that contain nicotine or tobacco. These products include cigarettes, chewing tobacco, and vaping devices, such as e-cigarettes. If you need help quitting, ask your health care provider. Stay active. Exercise, walk, or do other activities. Ask your provider what activities are safe for you. General instructions Take over-the-counter and prescription medicines only as told by your provider. Drink enough fluid to keep your pee (urine) pale yellow. Wear compression stockings as told by your provider. These stockings help to prevent blood clots and reduce swelling in your legs. Keep all follow-up visits. Your provider will check your legs for any changes and adjust your treatment plan as needed. Contact a  health care provider if: You have redness, swelling, or more pain in the affected area. You see a red streak or line that goes up or down from the area. You have skin breakdown or skin loss. You get an injury in the affected area. You get a fever. Get help right away if: You have severe pain that  does not get better with medicine. You get an injury and an open wound in the affected area. Your foot or ankle becomes numb or weak all of a sudden. You have trouble moving your foot or ankle. Your symptoms do not go away or get worse. You have chest pain. You have shortness of breath. These symptoms may be an emergency. Get help right away. Call 911. Do not wait to see if the symptoms will go away. Do not drive yourself to the hospital. This information is not intended to replace advice given to you by your health care provider. Make sure you discuss any questions you have with your health care provider. Document Revised: 12/19/2022 Document Reviewed: 12/19/2022 Elsevier Patient Education  2024 [employer].

## 2023-12-16 ENCOUNTER — Other Ambulatory Visit: Payer: Self-pay | Admitting: Physician Assistant

## 2023-12-17 ENCOUNTER — Encounter: Payer: Self-pay | Admitting: Physician Assistant

## 2024-01-11 ENCOUNTER — Ambulatory Visit: Payer: 59 | Admitting: Physician Assistant

## 2024-01-11 VITALS — BP 148/92 | HR 87 | Ht 65.0 in | Wt 218.0 lb

## 2024-01-11 DIAGNOSIS — G8929 Other chronic pain: Secondary | ICD-10-CM | POA: Diagnosis not present

## 2024-01-11 DIAGNOSIS — M170 Bilateral primary osteoarthritis of knee: Secondary | ICD-10-CM | POA: Diagnosis not present

## 2024-01-11 DIAGNOSIS — R600 Localized edema: Secondary | ICD-10-CM

## 2024-01-11 DIAGNOSIS — M25561 Pain in right knee: Secondary | ICD-10-CM

## 2024-01-11 MED ORDER — LIDOCAINE HCL (PF) 1 % IJ SOLN
9.0000 mL | Freq: Once | INTRAMUSCULAR | Status: AC
  Administered 2024-01-11: 9 mL

## 2024-01-11 MED ORDER — METHYLPREDNISOLONE ACETATE 40 MG/ML IJ SUSP
40.0000 mg | Freq: Once | INTRAMUSCULAR | Status: AC
  Administered 2024-01-11: 40 mg via INTRAMUSCULAR
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
## 2024-01-14 ENCOUNTER — Encounter: Payer: Self-pay | Admitting: Physician Assistant

## 2024-02-18 ENCOUNTER — Ambulatory Visit (INDEPENDENT_AMBULATORY_CARE_PROVIDER_SITE_OTHER): Payer: 59 | Admitting: Physician Assistant

## 2024-02-18 ENCOUNTER — Encounter: Payer: Self-pay | Admitting: Physician Assistant

## 2024-02-18 VITALS — BP 167/87 | HR 82 | Ht 65.0 in | Wt 223.8 lb

## 2024-02-18 DIAGNOSIS — M67874 Other specified disorders of tendon, left ankle and foot: Secondary | ICD-10-CM | POA: Diagnosis not present

## 2024-02-18 MED ORDER — IBUPROFEN 800 MG PO TABS: 800.0000 mg | ORAL_TABLET | Freq: Three times a day (TID) | ORAL | 1 refills | Status: DC | PRN

## 2024-02-18 MED ORDER — HYDROCODONE-ACETAMINOPHEN 5-325 MG PO TABS: 1.0000 | ORAL_TABLET | Freq: Four times a day (QID) | ORAL | 0 refills | Status: DC | PRN

## 2024-02-18 MED ORDER — HYDROCODONE-ACETAMINOPHEN 5-325 MG PO TABS: 1.0000 | ORAL_TABLET | Freq: Four times a day (QID) | ORAL | 0 refills | Status: AC | PRN
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
## 2024-02-18 NOTE — Patient Instructions (Signed)
Achilles Tendinitis  Achilles tendinitis is inflammation of the tough, cord-like band that connects the lower leg muscles to the heel bone (Achilles tendon). This is often caused by using the tendon and ankle joint too much. In most cases, Achilles tendinitis gets better over time with treatment and home care. It can take weeks or months to fully heal. What are the causes? This condition may be caused by: A sudden increase in exercise or activity, such as running. Doing the same exercises or activities, such as jumping, over and over. Not warming up your calf muscles before you exercise. Exercising in shoes that are worn out or not made for exercise. Having arthritis or a bone growth (spur) on the back of your heel. This can rub against the tendon and hurt it. Age-related wear and tear. Tendons become less flexible with age and are more likely to be injured. What are the signs or symptoms? Common symptoms of this condition include: Pain in your Achilles tendon or in the back of your leg, just above your heel. The pain may get worse when you exercise. Stiffness or soreness in the back of your leg. You may feel it most often in the morning. Swelling of the skin over the Achilles tendon. Thickening of the tendon. Trouble standing on tiptoe. How is this diagnosed? This condition is diagnosed based on your symptoms and a physical exam. You may also have tests, such as: X-rays. MRI. Ultrasound. How is this treated? The goal of treatment is to relieve symptoms and help your injury heal. You may need to: Decrease or stop activities that caused the tendinitis. You may be told to switch to low-impact exercises like biking or swimming. Ice the injured area. Do physical therapy. This may include strengthening and stretching exercises. Take NSAIDs, such as ibuprofen. These can help with pain and swelling. Use supportive shoes, wraps, heel lifts, or a walking boot (air cast). Have surgery. This may  be done if your symptoms do not get better with other treatments. Use high-energy waves to start the healing process (extracorporeal shock wave therapy). This is rare. Get an injection of medicines that help with inflammation (corticosteroids). This is rare. Follow these instructions at home: If you have an air cast: Wear the air cast as told by your health care provider. Remove it only as told by your provider. Check the skin around the air cast every day. Tell your provider about any concerns. Loosen the air cast if your toes tingle, become numb, or turn cold and blue. Keep the air cast clean. If the air cast is not waterproof: Do not let it get wet. Cover it with a watertight covering when you take a bath or shower. Managing pain, stiffness, and swelling  If told, put ice on the injured area. If you have a removable air cast, remove it as told by your provider. Put ice in a plastic bag. Place a towel between your skin and the bag. Leave the ice on for 20 minutes, 2-3 times a day. If your skin turns bright red, remove the ice right away to prevent skin damage. The risk of damage is higher if you cannot feel pain, heat, or cold. Move your toes often to reduce stiffness and swelling. Raise (elevate) your foot above the level of your heart while you are sitting or lying down. Activity Do not do activities that cause pain. Ask your provider when it is safe to drive if you have an air cast on your foot. If  you go to physical therapy, do exercises as told by your provider or therapist. Return to your normal activities as told by your provider. Ask your provider what activities are safe for you. General instructions Take over-the-counter and prescription medicines only as told by your provider. If told, wrap your foot with an elastic bandage or other wrap. This can help to keep your tendon from moving too much while it heals. Your provider will show you how to wrap your foot. Wear supportive  shoes or heel lifts only as told by your provider. Contact a health care provider if: Your symptoms get worse. Your pain does not get better with medicine. You have new symptoms that you cannot explain. You have warmth and swelling in your foot. You have a fever. Get help right away if: You hear a sudden popping sound in your Achilles tendon and then have severe pain. You cannot move your toes or foot. You cannot put any weight on your foot. Your foot or toes become numb and look white or blue even after you loosen your bandage or air cast. This information is not intended to replace advice given to you by your health care provider. Make sure you discuss any questions you have with your health care provider. Document Revised: 08/25/2022 Document Reviewed: 08/25/2022 Elsevier Patient Education  2024 [employer].

## 2024-02-19 ENCOUNTER — Encounter: Payer: Self-pay | Admitting: Physician Assistant

## 2024-03-20 ENCOUNTER — Encounter: Payer: Self-pay | Admitting: Podiatry

## 2024-03-20 ENCOUNTER — Ambulatory Visit: Admitting: Podiatry

## 2024-03-20 DIAGNOSIS — M67874 Other specified disorders of tendon, left ankle and foot: Secondary | ICD-10-CM | POA: Diagnosis not present

## 2024-03-20 NOTE — Progress Notes (Signed)
  Subjective:  Patient ID: Joanne Winters, female    DOB: [birth year],   MRN: [id number]  No chief complaint on file.   58 y.o. female presents for  follow-up of left achilles tendonitis. Had cancelled her surgery last year because she was doing better. However the pain has returned and wants to plan for surgery again.[name] Kitchen  Pas history: 58 year old femal with left achilles tendonitis that has been going on for 6+ months. She has been in the care of Dr. Karie Schwalbe and has undergone PT, injections, and immobilization and continued to have pain in the heel. She recently had an MRI that showed partial tear and tendinosis of bursitis. Was sent here to discuss possible surgical intervention as conservative treatment has been exhausted.  . Denies any other pedal complaints.  She is not diabetic and does not smoke. Denies n/v/f/c.   Past Medical History:  Diagnosis Date   Essential hypertension 12/06/2015   GERD (gastroesophageal reflux disease)    Menorrhagia    Morbid obesity (HCC) 12/22/2014   Seasonal allergies     Objective:  Physical Exam: Vascular: DP/PT pulses 2/4 bilateral. CFT <3 seconds. Normal hair growth on digits. No edema.  Skin. No lacerations or abrasions bilateral feet.  Musculoskeletal: MMT 5/5 bilateral lower extremities in DF, PF, Inversion and Eversion. Deceased ROM in DF of ankle joint. Tender to insertion of left achilles tendon. Minimal pain tracking proximally along the tendon.  Neurological: Sensation intact to light touch.   MRI left heel IMPRESSION: 1. Moderate tendinosis of the distal Achilles tendon with a small insertional interstitial tear along the ventral aspect at the insertion and mild peritenonitis. Mild retrocalcaneal bursitis. 2. Mild plantar fasciitis of the medial band of the plantar fascia.  Assessment:   1. Achilles tendinosis of left ankle, insertional       Plan:  Patient was evaluated and treated and all questions answered. -Xrays reviewed and MRI  reviewed. Spurring noted to the posterior calcaneus with significant haglunds deformity noted.  -Discussed Achilles insertional tendonitis and treatment options with patient.  -Discussed stretching exercises anti-inflammatories and offloading. At this point she has exhuasted conservative treamtents and suggest next steps would be surgical intervention.  -Discussed secondary achilles repair with resection of haglunds deformity.  -Informed surgical risk consent was reviewed and read aloud to the patient.  I reviewed the films.  I have discussed my findings with the patient in great detail.  I have discussed all risks including but not limited to infection, stiffness, scarring, limp, disability, deformity, damage to blood vessels and nerves, numbness, poor healing, need for braces, arthritis, chronic pain, amputation, death.  All benefits and realistic expectations discussed in great detail.  I have made no promises as to the outcome.  I have provided realistic expectations.  I have offered the patient a 2nd opinion, which they have declined and assured me they preferred to proceed despite the risks. -Will plan for surgery in June  -Plan for pain and nausea medication post-op as well as antibiotics and ASA BID for thrombosis prophylaxis.   Louann Sjogren, DPM

## 2024-03-24 ENCOUNTER — Telehealth: Payer: Self-pay | Admitting: Podiatry

## 2024-03-24 NOTE — Telephone Encounter (Signed)
 Left message for pt that we received the paperwork for her to proceed with surgery and I was calling to get her scheduled for surgery in June. Please call me back at [phone].

## 2024-03-26 ENCOUNTER — Telehealth: Payer: Self-pay | Admitting: Podiatry

## 2024-03-26 NOTE — Telephone Encounter (Signed)
 Pt left message that she checked with her work/[employer] and they told her she would not be able to take time off under the fmla until after 08/25/24. Pt asking if that would be ok? Please advise.

## 2024-03-26 NOTE — Telephone Encounter (Signed)
 Called pt and she is scheduled for 9/9 and will call if anything changes or if she needs to be seen

## 2024-04-16 ENCOUNTER — Other Ambulatory Visit: Payer: Self-pay | Admitting: Physician Assistant

## 2024-04-16 DIAGNOSIS — F40243 Fear of flying: Secondary | ICD-10-CM

## 2024-04-16 NOTE — Telephone Encounter (Signed)
 Requesting rx rf of alprazolam  0.5mg    Patient comment: I'm flying  out on May 09,2025   Last written 09/24/2023 Last OV 02/18/2024 Upcoming appt = none

## 2024-04-17 MED ORDER — ALPRAZOLAM 0.5 MG PO TABS: ORAL_TABLET | ORAL | 0 refills | Status: AC

## 2024-04-18 ENCOUNTER — Other Ambulatory Visit: Payer: Self-pay

## 2024-04-18 ENCOUNTER — Other Ambulatory Visit: Payer: Self-pay | Admitting: Physician Assistant

## 2024-04-18 DIAGNOSIS — K2100 Gastro-esophageal reflux disease with esophagitis, without bleeding: Secondary | ICD-10-CM

## 2024-04-18 MED ORDER — PANTOPRAZOLE SODIUM 40 MG PO TBEC: 40.0000 mg | DELAYED_RELEASE_TABLET | Freq: Two times a day (BID) | ORAL | 3 refills | Status: DC

## 2024-04-18 NOTE — Telephone Encounter (Signed)
 Patient requesting rx rf o f Pantoprazole  40mg   Last written 04/26/2023 Last OV 02/18/2024 Upcoming appt =none

## 2024-05-26 ENCOUNTER — Encounter: Payer: Self-pay | Admitting: Physician Assistant

## 2024-05-26 ENCOUNTER — Ambulatory Visit: Admitting: Physician Assistant

## 2024-05-26 VITALS — BP 160/79 | HR 75 | Ht 65.0 in | Wt 214.0 lb

## 2024-05-26 DIAGNOSIS — R109 Unspecified abdominal pain: Secondary | ICD-10-CM | POA: Insufficient documentation

## 2024-05-26 DIAGNOSIS — R319 Hematuria, unspecified: Secondary | ICD-10-CM | POA: Diagnosis not present

## 2024-05-26 LAB — POCT URINALYSIS DIP (CLINITEK)
Bilirubin, UA: NEGATIVE
Glucose, UA: NEGATIVE mg/dL
Ketones, POC UA: NEGATIVE mg/dL
Leukocytes, UA: NEGATIVE
Nitrite, UA: NEGATIVE
POC PROTEIN,UA: 30 — AB
Spec Grav, UA: >=1.03 — AB (ref 1.010–1.025)
Urobilinogen, UA: 1 U/dL
pH, UA: 5.5 (ref 5.0–8.0)

## 2024-05-26 MED ORDER — TAMSULOSIN HCL 0.4 MG PO CAPS: 0.4000 mg | ORAL_CAPSULE | Freq: Every day | ORAL | 0 refills | Status: DC

## 2024-05-26 MED ORDER — CYCLOBENZAPRINE HCL 10 MG PO TABS: 10.0000 mg | ORAL_TABLET | Freq: Three times a day (TID) | ORAL | 0 refills | Status: DC | PRN

## 2024-05-26 NOTE — Patient Instructions (Signed)
 Continue to take ibuprofen  every 4-6 hours Start flomax daily for 15 days Flexeril  as needed Warm compresses over left flank  Kidney Stones  Kidney stones are solid, rock-like deposits that form inside of the kidneys. The kidneys are a pair of organs that make urine. A kidney stone may form in a kidney and move into other parts of the urinary tract, including the tubes that connect the kidneys to the bladder (ureters), the bladder, and the tube that carries urine out of the body (urethra). As the stone moves through these areas, it can cause intense pain and block the flow of urine. Kidney stones are created when high levels of certain minerals are found in the urine. The stones are usually passed out of the body through urination, but in some cases, medical treatment may be needed to remove them. What are the causes? Kidney stones may be caused by: A condition in which certain glands produce too much parathyroid hormone (primary hyperparathyroidism), which causes too much calcium buildup in the blood. A buildup of uric acid crystals in the bladder (hyperuricosuria). Uric acid is a chemical that the body produces when you eat certain foods. It usually leaves the body in the urine. Narrowing (stricture) of one or both of the ureters. A kidney blockage that is present at birth (congenital obstruction). Past surgery on the kidney or the ureters. What increases the risk? The following factors may make you more likely to develop this condition: Having had a kidney stone in the past. Having a family history of kidney stones. Not drinking enough water. Eating a diet that is high in protein, salt (sodium), or sugar. Being overweight or obese. What are the signs or symptoms? Symptoms of a kidney stone may include: Pain in the side of the abdomen, right below the ribs (flank pain). Pain usually spreads (radiates) to the groin. Needing to urinate often or urgently. Painful urination. Blood in the  urine (hematuria). Nausea. Vomiting. Fever and chills. How is this diagnosed? This condition may be diagnosed based on: Your symptoms and medical history. A physical exam. Blood tests. Urine tests. These may be done before and after the stone passes out of your body through urination. Imaging tests, such as a CT scan, abdominal X-ray, or ultrasound. A procedure to examine the inside of the bladder (cystoscopy). How is this treated? Treatment for kidney stones depends on the size, location, and makeup of the stones. Kidney stones will often pass out of the body through urination. You may need to: Increase your fluid intake to help pass the stone. In some cases, you may be given fluids through an IV and may need to be monitored in the hospital. Take medicine for pain. Make changes in your diet to help prevent kidney stones from coming back. Sometimes, procedures are needed to remove a kidney stone. This may involve: A procedure to break up kidney stones using: A focused beam of light (laser therapy). Shock waves (extracorporeal shock wave lithotripsy). Surgery to remove kidney stones. This may be needed if you have severe pain or have stones that block your urinary tract. Follow these instructions at home: Medicines Take over-the-counter and prescription medicines only as told by your health care provider. Ask your health care provider if the medicine prescribed to you requires you to avoid driving or using heavy machinery. Eating and drinking Drink enough fluid to keep your urine pale yellow. You may be instructed to drink at least 8-10 glasses of water each day. This will help  you pass the kidney stone. If directed, change your diet. This may include: Limiting how much sodium you eat. Eating more fruits and vegetables. Limiting how much animal protein you eat. Animal proteins include red meat, poultry, fish, and eggs. Eating a normal amount of calcium (1,000-1,300 mg per day). Follow  instructions from your health care provider about eating or drinking restrictions. General instructions Collect urine samples as told by your health care provider. You may need to collect a urine sample: 24 hours after you pass the stone. 8-12 weeks after you pass the kidney stone, and every 6-12 months after that. Strain your urine every time you urinate, for as long as directed. Use the strainer that your health care provider recommends. Do not throw out the kidney stone after passing it. Keep the stone so it can be tested by your health care provider. Testing the makeup of your kidney stone may help prevent you from getting kidney stones in the future. Keep all follow-up visits. You may need follow-up X-rays or ultrasounds to make sure that your stone has passed. How is this prevented? To prevent another kidney stone: Drink enough fluid to keep your urine pale yellow. This is the best way to prevent kidney stones. Eat a healthy diet. Follow recommendations from your health care provider about foods to avoid. Recommendations vary depending on the type of kidney stone that you have. You may be instructed to eat a low-protein diet. Maintain a healthy weight. Where to find more information National Kidney Foundation (NKF): www.kidney.org [facility]): www.urologyhealth.org Contact a health care provider if: You have pain that gets worse or does not get better with medicine. Get help right away if: You have a fever or chills. You develop severe pain. You develop new abdominal pain. You faint. You are unable to urinate. Summary Kidney stones are solid, rock-like deposits that form inside of the kidneys. Kidney stones can cause nausea, vomiting, blood in the urine, abdominal pain, and the urge to urinate often. Treatment for kidney stones depends on the size, location, and makeup of the stones. Kidney stones will often pass out of the body through urination. Kidney stones can  be prevented by drinking enough fluids, eating a healthy diet, and maintaining a healthy weight. This information is not intended to replace advice given to you by your health care provider. Make sure you discuss any questions you have with your health care provider. Document Revised: 03/15/2022 Document Reviewed: 03/15/2022 Elsevier Patient Education  2024 [employer].

## 2024-05-26 NOTE — Progress Notes (Unsigned)
 Acute Office Visit  Subjective:     Patient ID: Joanne Winters, female    DOB: 05/17/1966, 58 y.o.   MRN: [id number]  Chief Complaint  Patient presents with   Medical Management of Chronic Issues    HPI Patient is in today for left flank pain for the last 3 days. She rates pain waxing and waning but the worse 7/10. She denies any dysuria or changes in urinary frequency or urgency. She denies any trauma to left side. She has not had any fever, chills, body aches.she denies any GI symptoms. Pain is not worsened b eating or drinking.  She has had a cough from URI that is clearing. She has taken some ibuprofen  that has not really helped much. She has never had a kidney stone. Pain is worsened by moving, touching or twisting.   ROS See HPI.      Objective:    BP (!) 160/79   Pulse 75   Ht 5\' 5"  (1.651 m)   Wt 214 lb (97.1 kg)   SpO2 99%   BMI 35.61 kg/m  BP Readings from Last 3 Encounters:  05/26/24 (!) 160/79  02/18/24 (!) 167/87  01/11/24 (!) 148/92   Wt Readings from Last 3 Encounters:  05/26/24 214 lb (97.1 kg)  02/18/24 223 lb 12 oz (101.5 kg)  01/11/24 218 lb (98.9 kg)      Physical Exam Constitutional:      Appearance: Normal appearance.  HENT:     Head: Normocephalic.  Cardiovascular:     Rate and Rhythm: Normal rate.  Pulmonary:     Effort: Pulmonary effort is normal.  Abdominal:     General: Bowel sounds are normal. There is no distension.     Palpations: Abdomen is soft. There is no mass.     Tenderness: There is abdominal tenderness. There is left CVA tenderness. There is no right CVA tenderness, guarding or rebound.     Comments: Left flank pain tenderness to palpation. No masses palpated.   Musculoskeletal:     Right lower leg: No edema.     Left lower leg: No edema.  Neurological:     General: No focal deficit present.     Mental Status: She is alert and oriented to person, place, and time.  Psychiatric:        Mood and Affect: Mood normal.     .. Results for orders placed or performed in visit on 05/26/24  POCT URINALYSIS DIP (CLINITEK)   Collection Time: 05/26/24  5:03 PM  Result Value Ref Range   Color, UA yellow yellow   Clarity, UA clear clear   Glucose, UA negative negative mg/dL   Bilirubin, UA negative negative   Ketones, POC UA negative negative mg/dL   Spec Grav, UA >=4.098 (A) 1.010 - 1.025   Blood, UA small (A) negative   pH, UA 5.5 5.0 - 8.0   POC PROTEIN,UA =30 (A) negative, trace   Urobilinogen, UA 1.0 0.2 or 1.0 E.U./dL   Nitrite, UA Negative Negative   Leukocytes, UA Negative Negative         Assessment & Plan:  Aaron AasAaron AasPearlie "[name]" was seen today for medical management of chronic issues.  Diagnoses and all orders for this visit:  Left flank pain -     cyclobenzaprine  (FLEXERIL ) 10 MG tablet; Take 1 tablet (10 mg total) by mouth 3 (three) times daily as needed for muscle spasms. TAKE 0.5-1 TABLET BY MOUTH 3 TIMES DAILY AS NEEDED FOR MUSCLE  SPASMS. CAUTION: CAN CAUSE DROWSINESS -     tamsulosin (FLOMAX) 0.4 MG CAPS capsule; Take 1 capsule (0.4 mg total) by mouth daily after breakfast. -     POCT URINALYSIS DIP (CLINITEK) -     Urine Culture  Hematuria, unspecified type -     cyclobenzaprine  (FLEXERIL ) 10 MG tablet; Take 1 tablet (10 mg total) by mouth 3 (three) times daily as needed for muscle spasms. TAKE 0.5-1 TABLET BY MOUTH 3 TIMES DAILY AS NEEDED FOR MUSCLE SPASMS. CAUTION: CAN CAUSE DROWSINESS -     tamsulosin (FLOMAX) 0.4 MG CAPS capsule; Take 1 capsule (0.4 mg total) by mouth daily after breakfast. -     POCT URINALYSIS DIP (CLINITEK) -     Urine Culture   UA positive for blood and protein Will culture Concern for kidney stone Flomax started for 15 days Flexeril  given to use as needed Ibuprofen  at home TID Toradol  60mg  IM given in office today Drink lots of water Consider warm compresses over left flank Follow up as needed if symptoms persist or worsen or change   Sandy Crumb,  PA-C

## 2024-05-27 ENCOUNTER — Encounter: Payer: Self-pay | Admitting: Physician Assistant

## 2024-05-28 ENCOUNTER — Ambulatory Visit: Payer: Self-pay | Admitting: Physician Assistant

## 2024-05-28 DIAGNOSIS — R319 Hematuria, unspecified: Secondary | ICD-10-CM | POA: Diagnosis not present

## 2024-05-28 DIAGNOSIS — R109 Unspecified abdominal pain: Secondary | ICD-10-CM | POA: Diagnosis not present

## 2024-05-28 LAB — URINE CULTURE

## 2024-05-28 MED ORDER — KETOROLAC TROMETHAMINE 60 MG/2ML IM SOLN
60.0000 mg | Freq: Once | INTRAMUSCULAR | Status: AC
  Administered 2024-05-28: 60 mg via INTRAMUSCULAR

## 2024-05-28 NOTE — Progress Notes (Signed)
 No abnormal bacteria found in urine culture. How are you feeling?

## 2024-05-28 NOTE — Addendum Note (Signed)
 Addended by: Posie Lillibridge A on: 05/28/2024 02:08 PM   Modules accepted: Orders

## 2024-06-02 ENCOUNTER — Other Ambulatory Visit: Payer: Self-pay | Admitting: Physician Assistant

## 2024-06-02 DIAGNOSIS — R109 Unspecified abdominal pain: Secondary | ICD-10-CM

## 2024-06-02 DIAGNOSIS — R319 Hematuria, unspecified: Secondary | ICD-10-CM

## 2024-07-04 ENCOUNTER — Telehealth: Payer: Self-pay | Admitting: Podiatry

## 2024-07-04 NOTE — Telephone Encounter (Signed)
 pt lft mess. clld her bck and advised to leave form at the [place] office and they are to fax them to me. If there is no fax I will complete and she can pick back up from office

## 2024-07-10 ENCOUNTER — Telehealth: Payer: Self-pay | Admitting: Podiatry

## 2024-07-10 NOTE — Telephone Encounter (Signed)
 lft mess on vmail for pt to call me back. DOS 08/26/24. I need to know last day she is going to work and advise of approx RTW 11/10/24. I have fax# to fax them for her to [facility].

## 2024-07-11 ENCOUNTER — Telehealth: Payer: Self-pay | Admitting: Podiatry

## 2024-07-11 DIAGNOSIS — Z0271 Encounter for disability determination: Secondary | ICD-10-CM

## 2024-07-11 NOTE — Telephone Encounter (Signed)
 cld pt bck from mess lft on my vmail. DOS 08/26/24 last day at work 08/25/24 RTW approx 11/10/24. Per Dr. she will prob be doing PT. adv her would revise once scheduled for extended time out.

## 2024-07-11 NOTE — Telephone Encounter (Signed)
 Faxed Attn Alicia/ [employer] [phone] and emailed her copy to email on file

## 2024-07-15 ENCOUNTER — Telehealth: Payer: Self-pay | Admitting: Podiatry

## 2024-07-15 NOTE — Telephone Encounter (Signed)
 pt lft mess on my vmail. I called her back and lft mess to check her spam( emailed her) and was faxed. I adv if she wants to pick up frm [place] office let me know and I wll send over to them for her to pick up

## 2024-08-07 ENCOUNTER — Telehealth: Payer: Self-pay | Admitting: Podiatry

## 2024-08-07 NOTE — Telephone Encounter (Signed)
 DOS- 08/26/2024  TARSAL TUN. RELEASE POP/SAPHENOUS BLOCK LT- 28035 CALCANEAL OSTEOTOMY (POSTERIOR) LT- 28118  [facility] EFFECTIVE DATE- 12/19/2023  DEDUCTIBLE- $500 REMAINING- $0 FAMILY DEDUCTIBLE- $1000 REMAINING- $500 OOP- $3500 REMAINING- $2627.96 FAMILY OOP- $7000 REMAINING- $3872.06 COINSURANCE- 10% AFTER DEDUCTIBLE MET/$0 COPAY  PER [employer] WEBSITE, NO PRIOR AUTHS ARE REQUIRED FOR CPT CODES 71964 AND [phone]. DECISION ID# [id number]

## 2024-08-15 ENCOUNTER — Telehealth: Payer: Self-pay | Admitting: Podiatry

## 2024-08-15 NOTE — Telephone Encounter (Signed)
 pt lft mess on my vmail. I cld bk and lft mess on her vmail to let me know if she wants to get form copies at [facility] or [place] office. I will try emailing again to addr on file. She said cld not open???

## 2024-08-15 NOTE — Telephone Encounter (Signed)
 pt cld back and said will try and open if not she will get from [facility] office on Tuesday. I will send to [facility] [facility] to have ready for pt on Tuesday.

## 2024-08-15 NOTE — Telephone Encounter (Signed)
 lt pt know [place] hours...she said she can get them on Thursday of next week. I will let [place] office know.

## 2024-08-19 ENCOUNTER — Encounter: Payer: Self-pay | Admitting: Sports Medicine

## 2024-08-26 ENCOUNTER — Other Ambulatory Visit: Payer: Self-pay | Admitting: Podiatry

## 2024-08-26 DIAGNOSIS — M7732 Calcaneal spur, left foot: Secondary | ICD-10-CM | POA: Diagnosis not present

## 2024-08-26 DIAGNOSIS — M67874 Other specified disorders of tendon, left ankle and foot: Secondary | ICD-10-CM | POA: Diagnosis not present

## 2024-08-26 MED ORDER — CEPHALEXIN 500 MG PO CAPS: 500.0000 mg | ORAL_CAPSULE | Freq: Three times a day (TID) | ORAL | 0 refills | Status: AC

## 2024-08-26 MED ORDER — OXYCODONE-ACETAMINOPHEN 5-325 MG PO TABS: 1.0000 | ORAL_TABLET | ORAL | 0 refills | Status: AC | PRN

## 2024-08-26 MED ORDER — ONDANSETRON HCL 4 MG PO TABS: 4.0000 mg | ORAL_TABLET | Freq: Three times a day (TID) | ORAL | 0 refills | Status: DC | PRN

## 2024-08-26 MED ORDER — ASPIRIN 81 MG PO TBEC
81.0000 mg | DELAYED_RELEASE_TABLET | Freq: Two times a day (BID) | ORAL | 0 refills | Status: DC
Start: 2024-08-26 — End: 2024-09-17

## 2024-08-29 ENCOUNTER — Telehealth: Payer: Self-pay | Admitting: Podiatry

## 2024-08-29 ENCOUNTER — Other Ambulatory Visit: Payer: Self-pay | Admitting: Podiatry

## 2024-08-29 MED ORDER — JOURNAVX 50 MG PO TABS: 1.0000 | ORAL_TABLET | Freq: Every day | ORAL | 0 refills | Status: AC

## 2024-08-29 NOTE — Telephone Encounter (Signed)
 Pt daughter called in asking if Mom can get another pain medication instead of Oxycodone  Pt does not like the Oxycodone   way makes her feel. Please advise [phone]

## 2024-09-04 ENCOUNTER — Ambulatory Visit (INDEPENDENT_AMBULATORY_CARE_PROVIDER_SITE_OTHER)

## 2024-09-04 ENCOUNTER — Telehealth: Payer: Self-pay | Admitting: *Deleted

## 2024-09-04 ENCOUNTER — Ambulatory Visit (INDEPENDENT_AMBULATORY_CARE_PROVIDER_SITE_OTHER): Admitting: Podiatry

## 2024-09-04 ENCOUNTER — Encounter: Payer: Self-pay | Admitting: Podiatry

## 2024-09-04 VITALS — BP 145/88 | HR 79 | Temp 97.8°F

## 2024-09-04 DIAGNOSIS — M67874 Other specified disorders of tendon, left ankle and foot: Secondary | ICD-10-CM

## 2024-09-04 DIAGNOSIS — Z9889 Other specified postprocedural states: Secondary | ICD-10-CM

## 2024-09-04 NOTE — Progress Notes (Signed)
 Subjective:  Patient ID: Joanne Winters, female    DOB: Aug 18, 1966,  MRN: [id number]  Chief Complaint  Patient presents with   Routine Post Op    POV # 1 DOS 08/26/24 LT HAGLANDS RESECTION W/ SECONDARY ACHILLES TENDON REPAIR It's okay. I have not had any nausea, chills, vomiting, or fever.  I have not had any pain since the surgery.    DOS: 08/26/24 Procedure: Left haglunds resection with secondary achilles tendon repair.   58 y.o. female returns for POV#1. Doing well and not having too much pain.   Review of Systems: Negative except as noted in the HPI. Denies N/V/F/Ch.  Past Medical History:  Diagnosis Date   Essential hypertension 12/06/2015   GERD (gastroesophageal reflux disease)    Menorrhagia    Morbid obesity (HCC) 12/22/2014   Seasonal allergies     Current Outpatient Medications:    aspirin  EC 81 MG tablet, Take 1 tablet (81 mg total) by mouth in the morning and at bedtime. Swallow whole., Disp: 60 tablet, Rfl: 0   ondansetron  (ZOFRAN ) 4 MG tablet, Take 1 tablet (4 mg total) by mouth every 8 (eight) hours as needed for nausea or vomiting., Disp: 20 tablet, Rfl: 0   albuterol  (VENTOLIN  HFA) 108 (90 Base) MCG/ACT inhaler, Inhale 1-2 puffs into the lungs every 6 (six) hours as needed for wheezing or shortness of breath., Disp: 8 g, Rfl: 0   ALPRAZolam  (XANAX ) 0.5 MG tablet, Take 1-2 tablets as needed for flying and travel., Disp: 10 tablet, Rfl: 0   Carbinoxamine  Maleate ER 4 MG/5ML SUER, TAKE 10 MLS BY MOUTH 2 (TWO) TIMES DAILY., Disp: 480 mL, Rfl: 0   cyclobenzaprine  (FLEXERIL ) 10 MG tablet, Take 1 tablet (10 mg total) by mouth 3 (three) times daily as needed for muscle spasms. TAKE 0.5-1 TABLET BY MOUTH 3 TIMES DAILY AS NEEDED FOR MUSCLE SPASMS. CAUTION: CAN CAUSE DROWSINESS, Disp: 90 tablet, Rfl: 0   ibuprofen  (ADVIL ) 800 MG tablet, Take 1 tablet (800 mg total) by mouth every 8 (eight) hours as needed., Disp: 90 tablet, Rfl: 1   pantoprazole  (PROTONIX ) 40 MG tablet, TAKE 1  TABLET BY MOUTH TWICE A DAY, Disp: 180 tablet, Rfl: 3   Suzetrigine  (JOURNAVX ) 50 MG TABS, Take 1 capsule by mouth daily for 7 days. To start take two capsules then followed by 1 every 12 hours as needed for pain., Disp: 8 tablet, Rfl: 0   tamsulosin  (FLOMAX ) 0.4 MG CAPS capsule, TAKE 1 CAPSULE (0.4 MG TOTAL) BY MOUTH DAILY AFTER BREAKFAST., Disp: 90 capsule, Rfl: 1  Social History   Tobacco Use  Smoking Status Never  Smokeless Tobacco Never    Allergies  Allergen Reactions   Hydrocodone  Other (See Comments)   Tetanus Toxoid     Throat closed up.    Tramadol      nausea   Objective:   Vitals:   09/04/24 1457  BP: (!) 145/88  Pulse: 79  Temp: 97.8 F (36.6 C)   There is no height or weight on file to calculate BMI. Constitutional Well developed. Well nourished.  Vascular Foot warm and well perfused. Capillary refill normal to all digits.   Neurologic Normal speech. Oriented to person, place, and time. Epicritic sensation to light touch grossly present bilaterally.  Dermatologic Skin healing well without signs of infection. Skin edges well coapted without signs of infection.  Orthopedic: Tenderness to palpation noted about the surgical site.   Radiographs: Interval resection of spurring and haglund deformity  Assessment:  1. Achilles tendinosis of left ankle, insertional   2. Status post surgery    Plan:  Patient was evaluated and treated and all questions answered.  S/p foot surgery left -Progressing as expected post-operatively. -WB Status: NWB in CAM boot  -Sutures: intact. -Medications: n/a -Foot redressed.  Return in 2 weeks for suture removal.   No follow-ups on file.

## 2024-09-09 ENCOUNTER — Ambulatory Visit: Admitting: Physician Assistant

## 2024-09-09 ENCOUNTER — Encounter: Payer: Self-pay | Admitting: Physician Assistant

## 2024-09-09 VITALS — BP 156/70 | HR 76 | Temp 98.3°F | Ht 65.0 in

## 2024-09-09 DIAGNOSIS — Z0000 Encounter for general adult medical examination without abnormal findings: Secondary | ICD-10-CM

## 2024-09-09 DIAGNOSIS — N898 Other specified noninflammatory disorders of vagina: Secondary | ICD-10-CM | POA: Diagnosis not present

## 2024-09-09 LAB — POCT URINALYSIS DIP (CLINITEK)
Bilirubin, UA: NEGATIVE
Glucose, UA: NEGATIVE mg/dL
Ketones, POC UA: NEGATIVE mg/dL
Nitrite, UA: NEGATIVE
POC PROTEIN,UA: 30 — AB
Spec Grav, UA: >=1.03 — AB (ref 1.010–1.025)
Urobilinogen, UA: 1 U/dL
pH, UA: 5.5 (ref 5.0–8.0)

## 2024-09-09 MED ORDER — TRIAMCINOLONE ACETONIDE 0.1 % EX OINT: 1.0000 | TOPICAL_OINTMENT | Freq: Two times a day (BID) | CUTANEOUS | 1 refills | Status: DC

## 2024-09-09 NOTE — Progress Notes (Signed)
 Acute Office Visit  Subjective:     Patient ID: Joanne Winters, female    DOB: [birth year], 58 y.o.   MRN: [id number]   HPI Discussed the use of AI scribe software for clinical note transcription with the patient, who gave verbal consent to proceed.  History of Present Illness Joanne Winters is a 58 year old female who presents with vaginal irritation and rash after using a new soap.  Vulvar irritation and rash - Significant vaginal irritation and rash following use of a new soap - Rash described as 'like a baby rash' with fine bumps located on the external labia - No vaginal discharge - Burning sensation with urination - No new sexual partners - No history of herpes or similar conditions - Application of Neosporin provided initial pain relief but did not resolve the rash  Recent medication and surgical history - Recent left Achilles repair and bone shaving surgery - Currently non-weight bearing - Postoperative course included multiple medications, which she believes may have contributed to current symptoms - Current medications include aspirin  and a pain medication that is not oxycodone  due to intolerance - Not currently taking pantoprazole  for acid reflux after misplacing it post-surgery    ROS See HPI.     Objective:    BP (!) 156/70 (BP Location: Right Arm, Patient Position: Sitting, Cuff Size: Normal)   Pulse 76   Temp 98.3 F (36.8 C) (Oral)   Ht 5' 5 (1.651 m)   SpO2 98%   BMI 35.61 kg/m  BP Readings from Last 3 Encounters:  09/09/24 (!) 156/70  09/04/24 (!) 145/88  05/26/24 (!) 160/79   Wt Readings from Last 3 Encounters:  05/26/24 214 lb (97.1 kg)  02/18/24 223 lb 12 oz (101.5 kg)  01/11/24 218 lb (98.9 kg)      Physical Exam Constitutional:      Appearance: Normal appearance.  HENT:     Head: Normocephalic.  Cardiovascular:     Rate and Rhythm: Normal rate and regular rhythm.  Pulmonary:     Effort: Pulmonary effort is normal.   Abdominal:     General: Bowel sounds are normal. There is no distension.     Palpations: Abdomen is soft. There is no mass.     Tenderness: There is no abdominal tenderness. There is no right CVA tenderness, left CVA tenderness, guarding or rebound.  Genitourinary:    Comments: Pt declined visual genital exam today.  Neurological:     General: No focal deficit present.     Mental Status: She is alert and oriented to person, place, and time.  Psychiatric:        Mood and Affect: Mood normal.     Results for orders placed or performed in visit on 09/09/24  POCT URINALYSIS DIP (CLINITEK)  Result Value Ref Range   Color, UA yellow yellow   Clarity, UA clear clear   Glucose, UA negative negative mg/dL   Bilirubin, UA negative negative   Ketones, POC UA negative negative mg/dL   Spec Grav, UA >=8.969 (A) 1.010 - 1.025   Blood, UA trace-lysed (A) negative   pH, UA 5.5 5.0 - 8.0   POC PROTEIN,UA =30 (A) negative, trace   Urobilinogen, UA 1.0 0.2 or 1.0 E.U./dL   Nitrite, UA Negative Negative   Leukocytes, UA Trace (A) Negative        Assessment & Plan:  SABRASABRAPearlie [name] was seen today for dysuria.  Diagnoses and all orders for this visit:  Vaginal  irritation -     POCT URINALYSIS DIP (CLINITEK) -     Urine Culture -     triamcinolone  ointment (KENALOG ) 0.1 %; Apply 1 Application topically 2 (two) times daily. To affected areas -     WET PREP FOR TRICH, YEAST, CLUE  Other orders -     Cancel: NuSwab Vaginitis Plus (VG+)   Assessment & Plan Vulvar and labial irritation with bumps Likely irritant contact dermatitis from new soap. Differential includes bacterial vaginosis and herpes, though herpes is less likely given absence of vesicles and no history of herpes or new sexual partners. - Prescribed triamcinolone  cream to apply twice daily for one week as needed. - Performed swab test for bacterial vaginosis. - pt not having UTI symptoms and UTI less likely - UA positive  for trace blood, protein, leukocytes: will culture and treat accordingly. - Advised follow-up if symptoms do not improve for further evaluation.  Doing well after left achilles surgery and bone spur removal.     Vermell Bologna, PA-C

## 2024-09-09 NOTE — Patient Instructions (Signed)
 Use triamcinolone  twice a day as needed for next 1-2 weeks  Irritation of the Vagina (Vaginitis): What to Know  Vaginitis is irritation and swelling of the vagina. It happens when the usual balance of bacteria and yeast in the vagina changes. This change causes some types to grow too much. This overgrowth leads to vaginitis. What are the causes? Bacteria. Yeast, which is a fungus. A parasite. A virus. Low hormones in the body. This can occur during pregnancy, breastfeeding, or after menopause. What increases the risk? Irritants, such as douches, bubble baths, scented tampons, and feminine sprays. Antibiotics. Poor hygiene. Wearing tight pants or thong underwear. Some birth control methods, such as diaphragms, vaginal sponges, or spermicides. Having sex without a condom or having sex with more than one person. Infections. Uncontrolled diabetes. What are the signs or symptoms? Abnormal fluid from the vagina. The fluid may be: White, gray, or yellow. Thick, white, and cheesy. Frothy and yellow or green. A bad smell from the vagina. Itching, pain, or swelling in the vagina. Pain during sex. Pain or burning when you pee. How is this diagnosed? This condition is diagnosed based on your symptoms, medical history, and an exam. This may include a pelvic exam. Tests may also be done. Tests may be done to: Check the pH level of your vagina. Check the fluid in your vagina. How is this treated? Treatment will depend on what is causing your vaginitis. Treatment may include: Antibiotics. Antifungal medicines. Medicines to treat symptoms if you have a virus. Your sex partner should also be treated. Estrogen medicines. Medicines to treat allergies. The medicines may be pills or creams. Follow these instructions at home: Lifestyle Keep the area around your vagina clean and dry. Avoid using soap. Rinse the area with water. Until your health care provider says it's okay: Do not douche. Do  not use tampons. Use pads, if needed. Do not have sex. Wipe from front to back after going to the bathroom. When the provider says it's okay, practice safe sex. Use condoms. General instructions Take your medicines only as told. If you were given antibiotics, take them as told. Do not stop taking them even if you start to feel better. How is this prevented? Use mild, unscented products. Avoid the following products if they are scented: Sprays. Detergents. Tampons. Products for cleaning the vagina. Soaps or bubble baths. Let air reach your genital area. To do this: Wear cotton underwear. Do not wear underwear while you sleep. Do not wear tight pants and underwear or pantyhose without a cotton panel. Do not wear thong underwear. Take off any wet clothing, such as bathing suits, as soon as possible. Practice safe sex. Use condoms. Contact a health care provider if: You have pain in the belly or around the pelvis. You have a fever or chills. You have symptoms that last for more than 2-3 days. This information is not intended to replace advice given to you by your health care provider. Make sure you discuss any questions you have with your health care provider. Document Revised: 09/06/2023 Document Reviewed: 04/16/2023 Elsevier Patient Education  2024 [employer].

## 2024-09-10 ENCOUNTER — Ambulatory Visit: Payer: Self-pay | Admitting: Physician Assistant

## 2024-09-10 LAB — WET PREP FOR TRICH, YEAST, CLUE
Clue Cell Exam: NEGATIVE
Trichomonas Exam: NEGATIVE
Yeast Exam: NEGATIVE

## 2024-09-10 NOTE — Progress Notes (Signed)
 Negative for trichomonas, yeast, BV.

## 2024-09-12 LAB — URINE CULTURE

## 2024-09-12 NOTE — Progress Notes (Signed)
 Very small colony count of bacteria seen in urine culture but we do not always treat based on this low of colony count. Are you still having vaginal symptoms?

## 2024-09-17 ENCOUNTER — Other Ambulatory Visit: Payer: Self-pay | Admitting: Podiatry

## 2024-09-17 DIAGNOSIS — Z0279 Encounter for issue of other medical certificate: Secondary | ICD-10-CM

## 2024-09-18 ENCOUNTER — Encounter: Payer: Self-pay | Admitting: Podiatry

## 2024-09-18 ENCOUNTER — Ambulatory Visit: Admitting: Podiatry

## 2024-09-18 DIAGNOSIS — Z9889 Other specified postprocedural states: Secondary | ICD-10-CM

## 2024-09-18 DIAGNOSIS — M67874 Other specified disorders of tendon, left ankle and foot: Secondary | ICD-10-CM

## 2024-09-18 NOTE — Progress Notes (Signed)
 Subjective:  Patient ID: Joanne Winters, female    DOB: [birth year],  MRN: [id number]  Chief Complaint  Patient presents with   Routine Post Op    POV # 2 DOS 08/26/24 LT HAGLANDS RESECTION W/ SECONDARY ACHILLES TENDON REPAIR.  No pain reported just itching. Swelling minimal redness some drainage on bandage. Not      DOS: 08/26/24 Procedure: Left haglunds resection with secondary achilles tendon repair.   58 y.o. female returns for POV#2. Doing well and not having too much pain.   Review of Systems: Negative except as noted in the HPI. Denies N/V/F/Ch.  Past Medical History:  Diagnosis Date   Essential hypertension 12/06/2015   GERD (gastroesophageal reflux disease)    Menorrhagia    Morbid obesity (HCC) 12/22/2014   Seasonal allergies     Current Outpatient Medications:    ALPRAZolam  (XANAX ) 0.5 MG tablet, Take 1-2 tablets as needed for flying and travel., Disp: 10 tablet, Rfl: 0   Carbinoxamine  Maleate ER 4 MG/5ML SUER, TAKE 10 MLS BY MOUTH 2 (TWO) TIMES DAILY., Disp: 480 mL, Rfl: 0   [facility] ASPIRIN  LOW DOSE 81 MG tablet, TAKE 1 TABLET (81 MG TOTAL) BY MOUTH IN THE MORNING AND AT BEDTIME. SWALLOW WHOLE., Disp: 180 tablet, Rfl: 1   cyclobenzaprine  (FLEXERIL ) 10 MG tablet, Take 1 tablet (10 mg total) by mouth 3 (three) times daily as needed for muscle spasms. TAKE 0.5-1 TABLET BY MOUTH 3 TIMES DAILY AS NEEDED FOR MUSCLE SPASMS. CAUTION: CAN CAUSE DROWSINESS, Disp: 90 tablet, Rfl: 0   ibuprofen  (ADVIL ) 800 MG tablet, Take 1 tablet (800 mg total) by mouth every 8 (eight) hours as needed., Disp: 90 tablet, Rfl: 1   ondansetron  (ZOFRAN ) 4 MG tablet, Take 1 tablet (4 mg total) by mouth every 8 (eight) hours as needed for nausea or vomiting., Disp: 20 tablet, Rfl: 0   pantoprazole  (PROTONIX ) 40 MG tablet, TAKE 1 TABLET BY MOUTH TWICE A DAY, Disp: 180 tablet, Rfl: 3   tamsulosin  (FLOMAX ) 0.4 MG CAPS capsule, TAKE 1 CAPSULE (0.4 MG TOTAL) BY MOUTH DAILY AFTER BREAKFAST., Disp: 90 capsule, Rfl: 1    triamcinolone  ointment (KENALOG ) 0.1 %, Apply 1 Application topically 2 (two) times daily. To affected areas, Disp: 60 g, Rfl: 1  Social History   Tobacco Use  Smoking Status Never  Smokeless Tobacco Never    Allergies  Allergen Reactions   Hydrocodone  Other (See Comments)   Tetanus Toxoid     Throat closed up.    Tramadol      nausea   Objective:   There were no vitals filed for this visit.  There is no height or weight on file to calculate BMI. Constitutional Well developed. Well nourished.  Vascular Foot warm and well perfused. Capillary refill normal to all digits.   Neurologic Normal speech. Oriented to person, place, and time. Epicritic sensation to light touch grossly present bilaterally.  Dermatologic Skin healing well without signs of infection. Skin edges well coapted without signs of infection.  Orthopedic: Tenderness to palpation noted about the surgical site.   Radiographs: Interval resection of spurring and haglund deformity  Assessment:   1. Achilles tendinosis of left ankle   2. Status post surgery    Plan:  Patient was evaluated and treated and all questions answered.  S/p foot surgery left -Progressing as expected post-operatively. -WB Status: NWB in CAM boot for one more week then may begin WBAT in CAM boot.  -Sutures:removed today without inicdent.  -Medications: n/a -Foot redressed.  Return in 3 weeks for recheck  Return in about 3 weeks (around 10/09/2024) for post op.

## 2024-10-08 ENCOUNTER — Telehealth: Payer: Self-pay | Admitting: *Deleted

## 2024-10-08 DIAGNOSIS — M67874 Other specified disorders of tendon, left ankle and foot: Secondary | ICD-10-CM

## 2024-10-08 NOTE — Telephone Encounter (Signed)
 I left a message for the patient asking her to come in a few minutes early for her appointment tomorrow because we need xrays of her foot.

## 2024-10-09 ENCOUNTER — Ambulatory Visit (INDEPENDENT_AMBULATORY_CARE_PROVIDER_SITE_OTHER)

## 2024-10-09 ENCOUNTER — Ambulatory Visit (INDEPENDENT_AMBULATORY_CARE_PROVIDER_SITE_OTHER): Admitting: Podiatry

## 2024-10-09 ENCOUNTER — Encounter: Payer: Self-pay | Admitting: Podiatry

## 2024-10-09 DIAGNOSIS — M67874 Other specified disorders of tendon, left ankle and foot: Secondary | ICD-10-CM | POA: Diagnosis not present

## 2024-10-09 DIAGNOSIS — Z9889 Other specified postprocedural states: Secondary | ICD-10-CM

## 2024-10-09 NOTE — Patient Instructions (Signed)
 Achilles Tendon Tear, Phase I Rehab Ask your health care provider which exercises are safe for you. Do exercises exactly as told by your provider and adjust them as directed. It is normal to feel mild stretching, pulling, tightness, or discomfort as you do these exercises. Stop right away if you feel sudden pain or your pain gets worse. Do not begin these exercises until told by your provider. Stretching and range-of-motion exercises These exercises warm up your muscles and joints. They can improve the movement and flexibility of your lower leg and heel. They also help to relieve pain. Dorsiflexion and plantar flexion  Sit with your left / right knee straight or bent. Do not rest your foot on anything. Move your left / right ankle to tilt the top of your foot toward your shin (dorsiflexion). Stop at the first point of resistance or at the angle where your provider told you to stop. Hold this position for __________ seconds. Point your toes down to tilt the top of your foot away from your shin (plantar flexion). Hold this position for __________ seconds. Repeat __________ times. Complete this exercise __________ times a day. Ankle alphabet  Sit with your left / right leg supported at the lower leg. Do not rest your foot on anything. Make sure your foot has room to move freely. Think of your left / right foot as a paintbrush. Move your foot to trace each letter of the alphabet in the air. Keep your hip and knee still while you trace the letters. Trace every letter of the alphabet. Repeat __________ times. Complete this exercise __________ times a day. Strengthening exercises These exercises build strength and endurance in your lower leg. Endurance is the ability to use your muscles for a long time, even after they get tired. Dorsiflexion  Secure a rubber exercise band or tube to an object, such as a table leg, that will stay still when the band is pulled. Secure the other end around your left /  right foot. Sit on the floor facing the table leg with your left / right leg straight (extended) and your toes pointing down. The band or tube should be slightly tense when your foot is relaxed. Slowly bend your left / right ankle and toes to bring the top of your foot toward your shin (dorsiflexion). Stop when you feel resistance in your Achilles tendon or stop at the angle where your provider told you to stop. Hold this position for __________ seconds. Slowly return your foot to the starting position. Repeat __________ times. Complete this exercise __________ times a day. Eversion  Sit on the floor with your legs straight out in front of you. Loop a rubber exercise band around your left / right foot, across the top part of the walking surface (ball) of that foot. Hold the band in your hands or secure it to a stable object. Slowly push your foot out without moving your leg, away from your other leg (eversion). Hold this position for __________ seconds. Slowly return your foot to the starting position. Repeat __________ times. Complete this exercise __________ times a day. Inversion  Sit on the floor with your legs straight out in front of you. Loop a rubber exercise band around your left / right foot, across the top part of the walking surface (ball) of that foot. Hold the band in your hands or secure it to a stable object. Slowly push your foot inward without moving your leg, toward your other leg (inversion). Hold this position  for __________ seconds. Slowly return your foot to the starting position. Repeat __________ times. Complete this exercise __________ times a day. Plantar flexion while seated  Sit on a chair with your feet flat on the floor. If told by your provider, place __________ lb / kg weight on your left / right knee. Keeping your toes firmly on the floor, lift your left / right heel as far as you can without feeling more discomfort in your ankle. This pushes your foot away  from you (plantar flexion). Bring your heel back down to the floor. Repeat __________ times. Complete this exercise __________ times a day. This information is not intended to replace advice given to you by your health care provider. Make sure you discuss any questions you have with your health care provider. Document Revised: 09/29/2022 Document Reviewed: 09/29/2022 Elsevier Patient Education  2024 [employer].

## 2024-10-09 NOTE — Progress Notes (Signed)
 Subjective:  Patient ID: Joanne Winters, female    DOB: 03-Jul-1966,  MRN: [id number]  Chief Complaint  Patient presents with   Routine Post Op    It's doing better.  I notice that it gets puffy behind my knee from being up on it too long.    DOS: 08/26/24 Procedure: Left haglunds resection with secondary achilles tendon repair.   58 y.o. female returns for POV#3. Doing well and not having too much pain.   Review of Systems: Negative except as noted in the HPI. Denies N/V/F/Ch.  Past Medical History:  Diagnosis Date   Essential hypertension 12/06/2015   GERD (gastroesophageal reflux disease)    Menorrhagia    Morbid obesity (HCC) 12/22/2014   Seasonal allergies     Current Outpatient Medications:    ALPRAZolam  (XANAX ) 0.5 MG tablet, Take 1-2 tablets as needed for flying and travel., Disp: 10 tablet, Rfl: 0   Carbinoxamine  Maleate ER 4 MG/5ML SUER, TAKE 10 MLS BY MOUTH 2 (TWO) TIMES DAILY., Disp: 480 mL, Rfl: 0   [facility] ASPIRIN  LOW DOSE 81 MG tablet, TAKE 1 TABLET (81 MG TOTAL) BY MOUTH IN THE MORNING AND AT BEDTIME. SWALLOW WHOLE., Disp: 180 tablet, Rfl: 1   cyclobenzaprine  (FLEXERIL ) 10 MG tablet, Take 1 tablet (10 mg total) by mouth 3 (three) times daily as needed for muscle spasms. TAKE 0.5-1 TABLET BY MOUTH 3 TIMES DAILY AS NEEDED FOR MUSCLE SPASMS. CAUTION: CAN CAUSE DROWSINESS, Disp: 90 tablet, Rfl: 0   ibuprofen  (ADVIL ) 800 MG tablet, Take 1 tablet (800 mg total) by mouth every 8 (eight) hours as needed., Disp: 90 tablet, Rfl: 1   pantoprazole  (PROTONIX ) 40 MG tablet, TAKE 1 TABLET BY MOUTH TWICE A DAY, Disp: 180 tablet, Rfl: 3   tamsulosin  (FLOMAX ) 0.4 MG CAPS capsule, TAKE 1 CAPSULE (0.4 MG TOTAL) BY MOUTH DAILY AFTER BREAKFAST., Disp: 90 capsule, Rfl: 1   triamcinolone  ointment (KENALOG ) 0.1 %, Apply 1 Application topically 2 (two) times daily. To affected areas, Disp: 60 g, Rfl: 1   ondansetron  (ZOFRAN ) 4 MG tablet, Take 1 tablet (4 mg total) by mouth every 8 (eight) hours as  needed for nausea or vomiting., Disp: 20 tablet, Rfl: 0  Social History   Tobacco Use  Smoking Status Never  Smokeless Tobacco Never    Allergies  Allergen Reactions   Hydrocodone  Other (See Comments)   Tetanus Toxoid     Throat closed up.    Tramadol      nausea   Objective:   There were no vitals filed for this visit.  There is no height or weight on file to calculate BMI. Constitutional Well developed. Well nourished.  Vascular Foot warm and well perfused. Capillary refill normal to all digits.   Neurologic Normal speech. Oriented to person, place, and time. Epicritic sensation to light touch grossly present bilaterally.  Dermatologic Skin healing well without signs of infection. Skin edges well coapted without signs of infection.  Orthopedic: Tenderness to palpation noted about the surgical site.   Radiographs: Interval resection of spurring and haglund deformity  Assessment:   1. Achilles tendinosis of left ankle   2. Status post surgery    Plan:  Patient was evaluated and treated and all questions answered.  S/p foot surgery left -Progressing as expected post-operatively. -WB Status: May transition to weightbearing as tolerated in regular shoe with heel lift for one week then to regular shoes.  -Advised on rehab exercises. Defers PT currently.  -Medications: n/a   Return in  6 weeks for recheck  No follow-ups on file.

## 2024-10-30 ENCOUNTER — Telehealth: Payer: Self-pay

## 2024-10-30 NOTE — Telephone Encounter (Signed)
 We do not have record of recent mammogram. Also due for colon cancer screening. Left message for a return call.

## 2024-10-31 ENCOUNTER — Encounter: Payer: Self-pay | Admitting: Podiatry

## 2024-10-31 ENCOUNTER — Ambulatory Visit: Admitting: Podiatry

## 2024-10-31 DIAGNOSIS — Z9889 Other specified postprocedural states: Secondary | ICD-10-CM

## 2024-10-31 DIAGNOSIS — M67874 Other specified disorders of tendon, left ankle and foot: Secondary | ICD-10-CM

## 2024-10-31 NOTE — Progress Notes (Signed)
 Subjective:  Patient ID: Joanne Winters, female    DOB: 04/28/1966,  MRN: [id number]  Chief Complaint  Patient presents with   Tendonitis    DOS: 08/26/24  Procedure: Left haglunds resection with secondary achilles tendon repair.  It's better.  I think I did too much exercise on it and it started swelling.  It got painful and it started hurting in my calf.  I slowed up on it.  I wonder if she can extend my time off until December 1. I work 12 hour shifts.  Do she think I should do less starting out?    DOS: 08/26/24 Procedure: Left haglunds resection with secondary achilles tendon repair.   58 y.o. female returns for POV#4. Doing well and not having too much pain.   Review of Systems: Negative except as noted in the HPI. Denies N/V/F/Ch.  Past Medical History:  Diagnosis Date   Essential hypertension 12/06/2015   GERD (gastroesophageal reflux disease)    Menorrhagia    Morbid obesity (HCC) 12/22/2014   Seasonal allergies     Current Outpatient Medications:    ALPRAZolam  (XANAX ) 0.5 MG tablet, Take 1-2 tablets as needed for flying and travel., Disp: 10 tablet, Rfl: 0   Carbinoxamine  Maleate ER 4 MG/5ML SUER, TAKE 10 MLS BY MOUTH 2 (TWO) TIMES DAILY., Disp: 480 mL, Rfl: 0   [facility] ASPIRIN  LOW DOSE 81 MG tablet, TAKE 1 TABLET (81 MG TOTAL) BY MOUTH IN THE MORNING AND AT BEDTIME. SWALLOW WHOLE., Disp: 180 tablet, Rfl: 1   cyclobenzaprine  (FLEXERIL ) 10 MG tablet, Take 1 tablet (10 mg total) by mouth 3 (three) times daily as needed for muscle spasms. TAKE 0.5-1 TABLET BY MOUTH 3 TIMES DAILY AS NEEDED FOR MUSCLE SPASMS. CAUTION: CAN CAUSE DROWSINESS, Disp: 90 tablet, Rfl: 0   ibuprofen  (ADVIL ) 800 MG tablet, Take 1 tablet (800 mg total) by mouth every 8 (eight) hours as needed., Disp: 90 tablet, Rfl: 1   pantoprazole  (PROTONIX ) 40 MG tablet, TAKE 1 TABLET BY MOUTH TWICE A DAY, Disp: 180 tablet, Rfl: 3   tamsulosin  (FLOMAX ) 0.4 MG CAPS capsule, TAKE 1 CAPSULE (0.4 MG TOTAL) BY MOUTH DAILY AFTER  BREAKFAST., Disp: 90 capsule, Rfl: 1   triamcinolone  ointment (KENALOG ) 0.1 %, Apply 1 Application topically 2 (two) times daily. To affected areas, Disp: 60 g, Rfl: 1   ondansetron  (ZOFRAN ) 4 MG tablet, Take 1 tablet (4 mg total) by mouth every 8 (eight) hours as needed for nausea or vomiting., Disp: 20 tablet, Rfl: 0  Social History   Tobacco Use  Smoking Status Never  Smokeless Tobacco Never    Allergies  Allergen Reactions   Hydrocodone  Other (See Comments)   Tetanus Toxoid     Throat closed up.    Tramadol      nausea   Objective:   There were no vitals filed for this visit.  There is no height or weight on file to calculate BMI. Constitutional Well developed. Well nourished.  Vascular Foot warm and well perfused. Capillary refill normal to all digits.   Neurologic Normal speech. Oriented to person, place, and time. Epicritic sensation to light touch grossly present bilaterally.  Dermatologic Skin healing well without signs of infection. Skin edges well coapted without signs of infection.  Orthopedic: Tenderness to palpation noted about the surgical site.   Radiographs: Interval resection of spurring and haglund deformity  Assessment:   1. Achilles tendinosis of left ankle   2. Status post surgery     Plan:  Patient  was evaluated and treated and all questions answered.  S/p foot surgery left -Progressing as expected post-operatively. -WB Status: May continue weightbearing as tolerated. -Advised on rehab exercises. Defers PT currently.  -Medications: n/a - Will have her out until December 1.  When she returns she will start with 8-hour days instead of 12-hour days for at least a couple weeks.   Patient discharged from surgical standpoint.  No follow-ups on file.

## 2024-11-04 ENCOUNTER — Telehealth: Payer: Self-pay | Admitting: Podiatry

## 2024-11-04 NOTE — Telephone Encounter (Signed)
 Recd form from [employer]. No charge since updating restrictions/accomm Faxed [phone] 11/17/24-11/30/24 8hr shift only. No walking or climbing any steps/stairs 12/01/24 pt can RTW with no restrictions No 25 charge since only updating restric/accomm

## 2024-11-10 ENCOUNTER — Telehealth: Payer: Self-pay | Admitting: Podiatry

## 2024-11-10 NOTE — Telephone Encounter (Signed)
 Patient called in requesting a return to work note stating that she can walk up 10 steps, and that she can work 10 hour shift. She mentioned having a follow up appointment on 12/5 at 8:45 am. She would like the note sent to her email.

## 2024-11-18 NOTE — Telephone Encounter (Signed)
 Dr. Joya sent pt letter of accomm via MyChart. 11/10/24

## 2024-11-19 NOTE — Telephone Encounter (Signed)
 pt lft mess about 11/24 andf 11/25. I cld bk and lft mess for her to confirm she needs a note for those dates and where should they go??

## 2024-11-20 ENCOUNTER — Encounter: Payer: Self-pay | Admitting: Podiatry

## 2024-11-20 NOTE — Telephone Encounter (Signed)
 lft mess to adv pt I faxed [employer] [phone] note she was out of work 11/24-11/28 due to her medical condtion. I also adv her I faxed them 11/04/24. She left mess they didnt get last forms faxed on 11/04/24- adv her of confirmation fax went thru

## 2024-11-21 ENCOUNTER — Encounter: Payer: Self-pay | Admitting: Podiatry

## 2024-11-21 ENCOUNTER — Ambulatory Visit: Admitting: Podiatry

## 2024-11-21 DIAGNOSIS — M67874 Other specified disorders of tendon, left ankle and foot: Secondary | ICD-10-CM

## 2024-11-21 MED ORDER — JOURNAVX 50 MG PO TABS: 1.0000 | ORAL_TABLET | Freq: Every day | ORAL | 0 refills | Status: AC | PRN

## 2024-11-21 NOTE — Progress Notes (Addendum)
 Subjective:  Patient ID: Joanne Winters, female    DOB: [birth year],  MRN: [id number]  Chief Complaint  Patient presents with   Routine Post Op    POV  DOS: 08/26/24  Left haglunds resection with secondary achilles tendon repair.  It's doing good but on the back of my leg, it gets really tight.  I been doing my stretching exercises.  I have to wear steel toe boots to work.  It hurts really bad when I get home from work.  I need that medicine that she prescribed me before, about at least three.  I need to know what support socks I can use.    DOS: 08/26/24 Procedure: Left haglunds resection with secondary achilles tendon repair.   58 y.o. female returns for continue pain in the back of her leg. She relates she started back at work.  Relates it gets very tight. She has been doing some stretching. She has to wear steel toes at work and hurts after work.   Review of Systems: Negative except as noted in the HPI. Denies N/V/F/Ch.  Past Medical History:  Diagnosis Date   Essential hypertension 12/06/2015   GERD (gastroesophageal reflux disease)    Menorrhagia    Morbid obesity (HCC) 12/22/2014   Seasonal allergies     Current Outpatient Medications:    ALPRAZolam  (XANAX ) 0.5 MG tablet, Take 1-2 tablets as needed for flying and travel., Disp: 10 tablet, Rfl: 0   Carbinoxamine  Maleate ER 4 MG/5ML SUER, TAKE 10 MLS BY MOUTH 2 (TWO) TIMES DAILY., Disp: 480 mL, Rfl: 0   ibuprofen  (ADVIL ) 800 MG tablet, Take 1 tablet (800 mg total) by mouth every 8 (eight) hours as needed., Disp: 90 tablet, Rfl: 1   pantoprazole  (PROTONIX ) 40 MG tablet, TAKE 1 TABLET BY MOUTH TWICE A DAY, Disp: 180 tablet, Rfl: 3   [facility] ASPIRIN  LOW DOSE 81 MG tablet, TAKE 1 TABLET (81 MG TOTAL) BY MOUTH IN THE MORNING AND AT BEDTIME. SWALLOW WHOLE., Disp: 180 tablet, Rfl: 1   cyclobenzaprine  (FLEXERIL ) 10 MG tablet, Take 1 tablet (10 mg total) by mouth 3 (three) times daily as needed for muscle spasms. TAKE 0.5-1 TABLET BY MOUTH 3 TIMES  DAILY AS NEEDED FOR MUSCLE SPASMS. CAUTION: CAN CAUSE DROWSINESS, Disp: 90 tablet, Rfl: 0   ondansetron  (ZOFRAN ) 4 MG tablet, Take 1 tablet (4 mg total) by mouth every 8 (eight) hours as needed for nausea or vomiting., Disp: 20 tablet, Rfl: 0   tamsulosin  (FLOMAX ) 0.4 MG CAPS capsule, TAKE 1 CAPSULE (0.4 MG TOTAL) BY MOUTH DAILY AFTER BREAKFAST., Disp: 90 capsule, Rfl: 1   triamcinolone  ointment (KENALOG ) 0.1 %, Apply 1 Application topically 2 (two) times daily. To affected areas, Disp: 60 g, Rfl: 1  Social History   Tobacco Use  Smoking Status Never  Smokeless Tobacco Never    Allergies  Allergen Reactions   Hydrocodone  Other (See Comments)   Tetanus Toxoid     Throat closed up.    Tramadol      nausea   Objective:   There were no vitals filed for this visit.  There is no height or weight on file to calculate BMI. Constitutional Well developed. Well nourished.  Vascular Foot warm and well perfused. Capillary refill normal to all digits.   Neurologic Normal speech. Oriented to person, place, and time. Epicritic sensation to light touch grossly present bilaterally.  Dermatologic Skin healing well without signs of infection. Skin edges well coapted without signs of infection.  Orthopedic: Tenderness to  palpation noted about the surgical site.   Radiographs: Interval resection of spurring and haglund deformity  Assessment:   1. Achilles tendinosis of left ankle     Plan:  Patient was evaluated and treated and all questions answered.  S/p foot surgery left -WB Status: May continue weightbearing as tolerated. -Advised on rehab exercises. Will continue these.  -Discussed compression stockings. -Will send refill of jounavax as needed for pain initially after work.   -Will return to work with no restrictions   Follow-up as needed  Return if symptoms worsen or fail to improve.

## 2024-11-21 NOTE — Addendum Note (Signed)
 Addended by: Tyshana Nishida R on: 11/21/2024 09:08 AM   Modules accepted: Orders

## 2024-11-28 ENCOUNTER — Ambulatory Visit: Admitting: Podiatry

## 2024-12-12 ENCOUNTER — Ambulatory Visit: Payer: Self-pay

## 2024-12-12 NOTE — Telephone Encounter (Signed)
 FYI Only or Action Required?: FYI only for provider: home care advice provided, patient will call back to schedule if no improvement or new symptoms.  Patient was last seen in primary care on 09/09/2024 by Antoniette Vermell CROME, PA-C.  Called Nurse Triage reporting Sore Throat.  Symptoms began 2 days ago.  Interventions attempted: OTC medications: Dayquil w/Elderberry, Nyquil w/elderberry and Rest, and hydration.  Symptoms are: temporary relief with otc medicines.  Triage Disposition: Home Care  Patient/caregiver understands and will follow disposition?: Yes  Message from Shanda MATSU sent at 12/12/2024  9:39 AM EST  Reason for Triage: Patient is reporting sore throat, body soreness, a heaviness to her body as well as chills, symptoms began on 12/10/2024.   Reason for Disposition  [1] Sore throat is the only symptom AND [2] sore throat present < 48 hours  Answer Assessment - Initial Assessment Questions Additional info: Patient calling to see if she needs an appointment, she developed symptoms of sore throat on 12/10/24, she has been treating at home with  Dayquil and Nyquil Elderberry with some relief. She is also experiencing body aches and fatigue. No difficulty swallowing but painful to do so. Home care advice provided. Patient will call back to schedule an appointment if no improvement with home care treatments and/or new symptoms.    1. ONSET: When did the throat start hurting? (Hours or days ago)      12/10/24 2. SEVERITY: How bad is the sore throat? (Scale 1-10; mild, moderate or severe)     Hurts to swallow 3. STREP EXPOSURE: Has there been any exposure to strep within the past week? If Yes, ask: What type of contact occurred?      Denies  4.  VIRAL SYMPTOMS: Are there any symptoms of a cold, such as a runny nose, cough, hoarse voice or red eyes?      Horse voice  5. FEVER: Do you have a fever? If Yes, ask: What is your temperature, how was it measured, and when did  it start?     Denies, felt warm earlier but no fever 6. PUS ON THE TONSILS: Is there pus on the tonsils in the back of your throat?     Denies  7. OTHER SYMPTOMS: Do you have any other symptoms? (e.g., difficulty breathing, headache, rash)     Body aches, fatigue. No concern for dehydration. Denies cough, sinus pain or pressure, congestion, and all other symptoms.  Protocols used: Sore Throat-A-AH

## 2024-12-25 NOTE — Telephone Encounter (Signed)
 I called and asked the patient to arrive 20 prior to her appointment and stop by imaging for xrays.

## 2024-12-29 ENCOUNTER — Ambulatory Visit: Payer: Self-pay | Admitting: Physician Assistant

## 2024-12-29 VITALS — BP 160/85 | HR 82 | Ht 65.0 in | Wt 219.0 lb

## 2024-12-29 DIAGNOSIS — N3281 Overactive bladder: Secondary | ICD-10-CM | POA: Diagnosis not present

## 2024-12-29 DIAGNOSIS — R350 Frequency of micturition: Secondary | ICD-10-CM | POA: Diagnosis not present

## 2024-12-29 DIAGNOSIS — E6609 Other obesity due to excess calories: Secondary | ICD-10-CM

## 2024-12-29 DIAGNOSIS — J309 Allergic rhinitis, unspecified: Secondary | ICD-10-CM

## 2024-12-29 DIAGNOSIS — M67874 Other specified disorders of tendon, left ankle and foot: Secondary | ICD-10-CM | POA: Diagnosis not present

## 2024-12-29 DIAGNOSIS — I10 Essential (primary) hypertension: Secondary | ICD-10-CM

## 2024-12-29 DIAGNOSIS — Z6836 Body mass index (BMI) 36.0-36.9, adult: Secondary | ICD-10-CM | POA: Diagnosis not present

## 2024-12-29 DIAGNOSIS — E66812 Obesity, class 2: Secondary | ICD-10-CM | POA: Diagnosis not present

## 2024-12-29 LAB — POCT URINALYSIS DIP (CLINITEK)
Bilirubin, UA: NEGATIVE
Glucose, UA: NEGATIVE mg/dL
Ketones, POC UA: NEGATIVE mg/dL
Leukocytes, UA: NEGATIVE
Nitrite, UA: NEGATIVE
POC PROTEIN,UA: NEGATIVE
Spec Grav, UA: >=1.03 — AB
Urobilinogen, UA: 0.2 U/dL
pH, UA: 5.5

## 2024-12-29 MED ORDER — CARBINOXAMINE MALEATE ER 4 MG/5ML PO SUER: 10.0000 mL | Freq: Two times a day (BID) | ORAL | 1 refills | Status: AC

## 2024-12-29 MED ORDER — LOSARTAN POTASSIUM 25 MG PO TABS: 25.0000 mg | ORAL_TABLET | Freq: Every day | ORAL | 1 refills | Status: DC

## 2024-12-29 MED ORDER — OXYBUTYNIN CHLORIDE ER 5 MG PO TB24: 5.0000 mg | ORAL_TABLET | Freq: Every day | ORAL | 1 refills | Status: DC

## 2024-12-29 MED ORDER — IBUPROFEN 800 MG PO TABS: 800.0000 mg | ORAL_TABLET | Freq: Three times a day (TID) | ORAL | 1 refills | Status: AC | PRN

## 2024-12-29 MED ORDER — ZEPBOUND 2.5 MG/0.5ML ~~LOC~~ SOAJ: 2.5000 mg | SUBCUTANEOUS | 0 refills | Status: AC

## 2024-12-29 NOTE — Patient Instructions (Signed)
 Start zepbound  weekly.  Cozaar  is for BP and daily.  Oxybutynin  is daily for urinary symptoms.   Kegel Exercises  Kegel exercises can help strengthen your pelvic floor muscles. The pelvic floor is a group of muscles that support your rectum, small intestine, and bladder. In females, pelvic floor muscles also help support the uterus. These muscles help you control the flow of urine and stool (feces). Kegel exercises are painless and simple. They do not require any equipment. Your provider may suggest Kegel exercises to: Improve bladder and bowel control. Improve sexual response. Improve weak pelvic floor muscles after surgery to remove the uterus (hysterectomy) or after pregnancy, in females. Improve weak pelvic floor muscles after prostate gland removal or surgery, in males. Kegel exercises involve squeezing your pelvic floor muscles. These are the same muscles you squeeze when you try to stop the flow of urine or keep from passing gas. The exercises can be done while sitting, standing, or lying down, but it is best to vary your position. Ask your health care provider which exercises are safe for you. Do exercises exactly as told by your health care provider and adjust them as directed. Do not begin these exercises until told by your health care provider. Exercises How to do Kegel exercises: Squeeze your pelvic floor muscles tight. You should feel a tight lift in your rectal area. If you are a female, you should also feel a tightness in your vaginal area. Keep your stomach, buttocks, and legs relaxed. Hold the muscles tight for up to 10 seconds. Breathe normally. Relax your muscles for up to 10 seconds. Repeat as told by your health care provider. Repeat this exercise daily as told by your health care provider. Continue to do this exercise for at least 4-6 weeks, or for as long as told by your health care provider. You may be referred to a physical therapist who can help you learn more about  how to do Kegel exercises. Depending on your condition, your health care provider may recommend: Varying how long you squeeze your muscles. Doing several sets of exercises every day. Doing exercises for several weeks. Making Kegel exercises a part of your regular exercise routine. This information is not intended to replace advice given to you by your health care provider. Make sure you discuss any questions you have with your health care provider. Document Revised: 04/14/2021 Document Reviewed: 04/14/2021 Elsevier Patient Education  2024 [employer].

## 2024-12-30 ENCOUNTER — Encounter: Payer: Self-pay | Admitting: Physician Assistant

## 2024-12-30 DIAGNOSIS — J309 Allergic rhinitis, unspecified: Secondary | ICD-10-CM | POA: Insufficient documentation

## 2024-12-30 NOTE — Progress Notes (Signed)
 "  Acute Office Visit  Subjective:     Patient ID: Joanne Winters, female    DOB: Aug 28, 1966, 59 y.o.   MRN: [id number]  Chief Complaint  Patient presents with   Dysuria    HPI Discussed the use of AI scribe software for clinical note transcription with the patient, who gave verbal consent to proceed.  History of Present Illness Joanne Winters is a 59 year old female who presents with urinary symptoms and postoperative foot pain.  Urinary symptoms - Onset approximately two weeks ago - Urinary urgency and occasional incontinence, especially when bladder is full - Often leaking after sneeze, laugh, cough - Difficulty holding urine when needing to use the bathroom   Postoperative foot pain after repair of achilles tendon - Status post foot surgery in September - Persistent postoperative pain, requesting ibuprofen  800mg  refill - Difficulty ascending and descending steps due to pain - Pain has impacted ability to work; workplace unable to accommodate restrictions - Returned to work on December 1st  Elevated blood pressure - Elevated blood pressure measured during visit - Attributes elevated blood pressure to pain and stress related to her mother's health issues - No chest pain or shortness of breath  Weight gain and depressed mood - Concerned about weight gain - Feels depressed about current situation    ROS See HPI.      Objective:    BP (!) 160/85 (Cuff Size: Normal)   Pulse 82   Ht 5' 5 (1.651 m)   Wt 219 lb (99.3 kg)   SpO2 99%   BMI 36.44 kg/m  BP Readings from Last 3 Encounters:  12/29/24 (!) 160/85  09/09/24 (!) 156/70  09/04/24 (!) 145/88   Wt Readings from Last 3 Encounters:  12/29/24 219 lb (99.3 kg)  05/26/24 214 lb (97.1 kg)  02/18/24 223 lb 12 oz (101.5 kg)      Physical Exam Constitutional:      Appearance: Normal appearance.  HENT:     Head: Normocephalic.  Cardiovascular:     Rate and Rhythm: Normal rate and regular rhythm.   Pulmonary:     Effort: Pulmonary effort is normal.     Breath sounds: Normal breath sounds.  Abdominal:     General: There is no distension.     Palpations: Abdomen is soft. There is no mass.     Tenderness: There is no abdominal tenderness. There is no right CVA tenderness, left CVA tenderness or guarding.  Neurological:     General: No focal deficit present.     Mental Status: She is alert and oriented to person, place, and time.  Psychiatric:        Mood and Affect: Mood normal.     Results for orders placed or performed in visit on 12/29/24  POCT URINALYSIS DIP (CLINITEK)  Result Value Ref Range   Color, UA yellow yellow   Clarity, UA clear clear   Glucose, UA negative negative mg/dL   Bilirubin, UA negative negative   Ketones, POC UA negative negative mg/dL   Spec Grav, UA >=8.969 (A) 1.010 - 1.025   Blood, UA trace-lysed (A) negative   pH, UA 5.5 5.0 - 8.0   POC PROTEIN,UA negative negative, trace   Urobilinogen, UA 0.2 0.2 or 1.0 E.U./dL   Nitrite, UA Negative Negative   Leukocytes, UA Negative Negative        Assessment & Plan:  .Joanne Winters was seen today for dysuria.  Diagnoses and all orders for this visit:  Urinary frequency -     POCT URINALYSIS DIP (CLINITEK) -     Urine Culture  Achilles tendinosis of left ankle, insertional -     ibuprofen  (ADVIL ) 800 MG tablet; Take 1 tablet (800 mg total) by mouth every 8 (eight) hours as needed.  Class 2 obesity due to excess calories without serious comorbidity with body mass index (BMI) of 36.0 to 36.9 in adult -     tirzepatide  (ZEPBOUND ) 2.5 MG/0.5ML Pen; Inject 2.5 mg into the skin once a week.  OAB (overactive bladder) -     oxybutynin  (DITROPAN -XL) 5 MG 24 hr tablet; Take 1 tablet (5 mg total) by mouth daily.  Chronic allergic rhinitis -     Carbinoxamine  Maleate ER 4 MG/5ML SUER; Take 10 mLs by mouth 2 (two) times daily.  Essential hypertension -     losartan  (COZAAR ) 25 MG tablet; Take 1 tablet  (25 mg total) by mouth daily.   Assessment & Plan Overactive bladder with urinary incontinence and frequency Sudden onset of urinary incontinence with urgency and leakage, suggestive of overactive bladder with weak pelvic floor. Trace hematuria noted with no protein, leukocytes, nitrites.  - Provided Kegel exercises handout. - Ordered urine culture. - Prescribed oxybutynin  to take daily to see if helps with symptoms.   Essential hypertension Elevated blood pressure, potentially exacerbated by pain and stress. No chest pain or shortness of breath. - Rechecked blood pressure before leaving with no significant improvement.  - Looking back at BP readings running high for quite sometime. Added losartan  25mg  daily.   Chronic allergic rhinitis - Controlled on Carbinoxamine  Maleate, refilled today.   Achilles tendonitis and s/p repair(left) - refilled ibuprofen  - continue to follow up with podiatry  Obesity Concern about weight gain and depression. Discussed potential for weight loss medications, contingent on insurance coverage. Patient has struggled with weight for years. Failed weight watchers, calorie counting, exercising.  - Check insurance coverage for weight loss medications. - Discuss weight management options if insurance covers medications. - Zepbound  2.5mg  weekly sent to pharmacy. Discussed side effects and titration up.  - Follow up in 1 month.       Return in about 4 weeks (around 01/26/2025) for BP/Urinary symptoms/weight.  Tawn Fitzner, PA-C   "

## 2024-12-31 ENCOUNTER — Ambulatory Visit: Payer: Self-pay | Admitting: Physician Assistant

## 2024-12-31 LAB — URINE CULTURE

## 2024-12-31 NOTE — Progress Notes (Signed)
 [name],   No significant bacteria noted in urine culture. The oxybutynin  should help frequency, urgency and leakage.

## 2025-01-01 ENCOUNTER — Telehealth: Payer: Self-pay

## 2025-01-01 NOTE — Telephone Encounter (Signed)
 Per patient Zepbound  is in need of prior authorization. Thank you for your help with this.

## 2025-01-01 NOTE — Progress Notes (Signed)
 Patient informed of lab results and information from provider

## 2025-01-02 ENCOUNTER — Other Ambulatory Visit (HOSPITAL_COMMUNITY): Payer: Self-pay

## 2025-01-02 ENCOUNTER — Telehealth: Payer: Self-pay

## 2025-01-02 NOTE — Telephone Encounter (Signed)
 Clinical questions answered and PA submitted.

## 2025-01-02 NOTE — Telephone Encounter (Signed)
 Pharmacy Patient Advocate Encounter   Received notification from Pt Calls Messages that prior authorization for Zepbound  2.5mg /0.59ml is required/requested.   Insurance verification completed.   The patient is insured through [employer].   Per test claim: PA required; PA started via CoverMyMeds. KEY [id number] . Waiting for clinical questions to populate.

## 2025-01-12 NOTE — Telephone Encounter (Signed)
 Pharmacy Patient Advocate Encounter  Received notification from [facility] [employer] that Prior Authorization for Zepbound  2.5mg /0.42ml has been DENIED.  See denial reason below. No denial letter attached in CMM. Will attach denial letter to Media tab once received.   PA #/Case ID/Reference #: [id number]

## 2025-01-12 NOTE — Telephone Encounter (Signed)
 The prior authorization for Zepbound  was denied by the insurance. Weight loss medications are plan exclusions. The patient has been updated of the outcome via a MyChart message.

## 2025-01-14 NOTE — Telephone Encounter (Signed)
 Showing that Zepbound  prior auth was denied in separate message and patient was informed via Mychart noted in this message.

## 2025-01-21 ENCOUNTER — Other Ambulatory Visit: Payer: Self-pay | Admitting: Physician Assistant

## 2025-01-21 DIAGNOSIS — N3281 Overactive bladder: Secondary | ICD-10-CM

## 2025-01-21 DIAGNOSIS — I10 Essential (primary) hypertension: Secondary | ICD-10-CM

## 2025-01-28 ENCOUNTER — Ambulatory Visit: Admitting: Physician Assistant

## 2025-01-30 ENCOUNTER — Ambulatory Visit: Admitting: Physician Assistant
